# Patient Record
Sex: Male | Born: 1975 | Race: Black or African American | Hispanic: No | Marital: Single | Smoking: Never smoker
Health system: Southern US, Community
[De-identification: ages and names within clinical notes are randomized; demographics above are authoritative.]

## PROBLEM LIST (undated history)

## (undated) DIAGNOSIS — Z21 Asymptomatic human immunodeficiency virus [HIV] infection status: Secondary | ICD-10-CM

## (undated) DIAGNOSIS — K029 Dental caries, unspecified: Secondary | ICD-10-CM

## (undated) DIAGNOSIS — A630 Anogenital (venereal) warts: Secondary | ICD-10-CM

## (undated) DIAGNOSIS — B20 Human immunodeficiency virus [HIV] disease: Secondary | ICD-10-CM

## (undated) DIAGNOSIS — M109 Gout, unspecified: Secondary | ICD-10-CM

## (undated) DIAGNOSIS — B009 Herpesviral infection, unspecified: Secondary | ICD-10-CM

## (undated) DIAGNOSIS — R635 Abnormal weight gain: Secondary | ICD-10-CM

## (undated) DIAGNOSIS — K625 Hemorrhage of anus and rectum: Secondary | ICD-10-CM

## (undated) HISTORY — PX: TENDON REPAIR: SHX5111

## (undated) HISTORY — DX: Gout, unspecified: M10.9

## (undated) HISTORY — DX: Human immunodeficiency virus (HIV) disease: B20

## (undated) HISTORY — DX: Dental caries, unspecified: K02.9

## (undated) HISTORY — DX: Asymptomatic human immunodeficiency virus (hiv) infection status: Z21

## (undated) HISTORY — DX: Herpesviral infection, unspecified: B00.9

## (undated) HISTORY — DX: Anogenital (venereal) warts: A63.0

---
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
## 2003-12-28 ENCOUNTER — Emergency Department: Payer: Self-pay | Admitting: Emergency Medicine

## 2004-08-01 ENCOUNTER — Emergency Department: Payer: Self-pay | Admitting: General Practice

## 2004-08-01 ENCOUNTER — Other Ambulatory Visit: Payer: Self-pay

## 2004-10-22 ENCOUNTER — Emergency Department: Payer: Self-pay | Admitting: Emergency Medicine

## 2004-10-22 ENCOUNTER — Other Ambulatory Visit: Payer: Self-pay

## 2005-07-04 ENCOUNTER — Emergency Department: Payer: Self-pay | Admitting: General Practice

## 2005-07-11 ENCOUNTER — Emergency Department: Payer: Self-pay | Admitting: Emergency Medicine

## 2005-07-13 ENCOUNTER — Emergency Department: Payer: Self-pay | Admitting: Unknown Physician Specialty

## 2005-07-14 ENCOUNTER — Emergency Department: Payer: Self-pay | Admitting: Emergency Medicine

## 2005-08-07 ENCOUNTER — Emergency Department: Payer: Self-pay | Admitting: Emergency Medicine

## 2005-08-08 ENCOUNTER — Emergency Department: Payer: Self-pay | Admitting: Emergency Medicine

## 2005-09-07 ENCOUNTER — Emergency Department: Payer: Self-pay | Admitting: General Practice

## 2006-10-22 ENCOUNTER — Emergency Department: Payer: Self-pay | Admitting: Emergency Medicine

## 2006-12-03 ENCOUNTER — Emergency Department: Payer: Self-pay | Admitting: Emergency Medicine

## 2006-12-03 ENCOUNTER — Other Ambulatory Visit: Payer: Self-pay

## 2006-12-19 ENCOUNTER — Emergency Department: Payer: Self-pay

## 2007-10-18 ENCOUNTER — Emergency Department: Payer: Self-pay | Admitting: Emergency Medicine

## 2007-10-24 ENCOUNTER — Emergency Department: Payer: Self-pay | Admitting: Emergency Medicine

## 2008-10-12 ENCOUNTER — Emergency Department: Payer: Self-pay | Admitting: Emergency Medicine

## 2008-10-13 ENCOUNTER — Emergency Department: Payer: Self-pay | Admitting: Emergency Medicine

## 2009-12-11 ENCOUNTER — Emergency Department: Payer: Self-pay | Admitting: Emergency Medicine

## 2010-01-17 ENCOUNTER — Emergency Department: Payer: Self-pay | Admitting: Unknown Physician Specialty

## 2010-07-12 ENCOUNTER — Emergency Department (HOSPITAL_COMMUNITY)
Admission: EM | Admit: 2010-07-12 | Discharge: 2010-07-12 | Disposition: A | Discharge status: Home / Self Care | Payer: Self-pay | Attending: Emergency Medicine | Admitting: Emergency Medicine

## 2010-07-12 DIAGNOSIS — B081 Molluscum contagiosum: Secondary | ICD-10-CM | POA: Insufficient documentation

## 2010-07-12 DIAGNOSIS — R369 Urethral discharge, unspecified: Secondary | ICD-10-CM | POA: Insufficient documentation

## 2010-12-04 ENCOUNTER — Emergency Department (HOSPITAL_COMMUNITY)
Admission: EM | Admit: 2010-12-04 | Discharge: 2010-12-05 | Disposition: A | Discharge status: Home / Self Care | Payer: Self-pay | Attending: Emergency Medicine | Admitting: Emergency Medicine

## 2010-12-04 ENCOUNTER — Emergency Department (HOSPITAL_COMMUNITY): Payer: Self-pay

## 2010-12-04 DIAGNOSIS — J3489 Other specified disorders of nose and nasal sinuses: Secondary | ICD-10-CM | POA: Insufficient documentation

## 2010-12-04 DIAGNOSIS — R079 Chest pain, unspecified: Secondary | ICD-10-CM | POA: Insufficient documentation

## 2010-12-04 DIAGNOSIS — R05 Cough: Secondary | ICD-10-CM | POA: Insufficient documentation

## 2010-12-04 DIAGNOSIS — J069 Acute upper respiratory infection, unspecified: Secondary | ICD-10-CM | POA: Insufficient documentation

## 2010-12-04 DIAGNOSIS — R059 Cough, unspecified: Secondary | ICD-10-CM | POA: Insufficient documentation

## 2011-02-06 ENCOUNTER — Emergency Department: Payer: Self-pay | Admitting: Unknown Physician Specialty

## 2014-01-07 ENCOUNTER — Ambulatory Visit: Payer: Self-pay | Admitting: Family Medicine

## 2014-01-16 ENCOUNTER — Emergency Department (HOSPITAL_COMMUNITY)
Admission: EM | Admit: 2014-01-16 | Discharge: 2014-01-16 | Disposition: A | Discharge status: Home / Self Care | Payer: Self-pay | Attending: Emergency Medicine | Admitting: Emergency Medicine

## 2014-01-16 ENCOUNTER — Encounter (HOSPITAL_COMMUNITY): Payer: Self-pay | Admitting: Emergency Medicine

## 2014-01-16 DIAGNOSIS — K625 Hemorrhage of anus and rectum: Secondary | ICD-10-CM | POA: Insufficient documentation

## 2014-01-16 DIAGNOSIS — R1030 Lower abdominal pain, unspecified: Secondary | ICD-10-CM | POA: Insufficient documentation

## 2014-01-16 LAB — URINALYSIS, ROUTINE W REFLEX MICROSCOPIC
BILIRUBIN URINE: NEGATIVE
GLUCOSE, UA: NEGATIVE mg/dL
Hgb urine dipstick: NEGATIVE
KETONES UR: NEGATIVE mg/dL
Leukocytes, UA: NEGATIVE
Nitrite: NEGATIVE
PH: 6.5 (ref 5.0–8.0)
Protein, ur: NEGATIVE mg/dL
Specific Gravity, Urine: 1.02 (ref 1.005–1.030)
Urobilinogen, UA: 0.2 mg/dL (ref 0.0–1.0)

## 2014-01-16 LAB — COMPREHENSIVE METABOLIC PANEL
ALBUMIN: 3.7 g/dL (ref 3.5–5.2)
ALK PHOS: 89 U/L (ref 39–117)
ALT: 20 U/L (ref 0–53)
AST: 21 U/L (ref 0–37)
Anion gap: 15 (ref 5–15)
BUN: 11 mg/dL (ref 6–23)
CHLORIDE: 100 meq/L (ref 96–112)
CO2: 23 mEq/L (ref 19–32)
Calcium: 9.2 mg/dL (ref 8.4–10.5)
Creatinine, Ser: 1.07 mg/dL (ref 0.50–1.35)
GFR calc Af Amer: >90 mL/min (ref >90)
GFR calc non Af Amer: 85 mL/min — ABNORMAL LOW (ref >90)
Glucose, Bld: 124 mg/dL — ABNORMAL HIGH (ref 70–99)
POTASSIUM: 4.1 meq/L (ref 3.7–5.3)
Sodium: 138 mEq/L (ref 137–147)
Total Protein: 7.8 g/dL (ref 6.0–8.3)

## 2014-01-16 LAB — CBC WITH DIFFERENTIAL/PLATELET
BASOS ABS: 0 10*3/uL (ref 0.0–0.1)
BASOS PCT: 1 % (ref 0–1)
Eosinophils Absolute: 0.3 10*3/uL (ref 0.0–0.7)
Eosinophils Relative: 5 % (ref 0–5)
HCT: 42.1 % (ref 39.0–52.0)
HEMOGLOBIN: 14 g/dL (ref 13.0–17.0)
Lymphocytes Relative: 38 % (ref 12–46)
Lymphs Abs: 2.5 10*3/uL (ref 0.7–4.0)
MCH: 30.6 pg (ref 26.0–34.0)
MCHC: 33.3 g/dL (ref 30.0–36.0)
MCV: 91.9 fL (ref 78.0–100.0)
MONOS PCT: 9 % (ref 3–12)
Monocytes Absolute: 0.6 10*3/uL (ref 0.1–1.0)
NEUTROS ABS: 3.1 10*3/uL (ref 1.7–7.7)
NEUTROS PCT: 47 % (ref 43–77)
Platelets: 300 10*3/uL (ref 150–400)
RBC: 4.58 MIL/uL (ref 4.22–5.81)
RDW: 13 % (ref 11.5–15.5)
WBC: 6.5 10*3/uL (ref 4.0–10.5)

## 2014-01-16 LAB — POC OCCULT BLOOD, ED: FECAL OCCULT BLD: NEGATIVE

## 2014-01-16 LAB — SAMPLE TO BLOOD BANK

## 2014-01-16 NOTE — ED Notes (Signed)
MD at bedside. 

## 2014-01-16 NOTE — ED Notes (Signed)
Pt. reports intermittent mid abdominal pain with rectal bleeding onset 2 weeks ago , denies nausea or vomitting / no diarrhea .

## 2014-01-16 NOTE — ED Notes (Signed)
Pt ambulating independently w/ steady gait on d/c in no acute distress, A&Ox4.  

## 2014-01-16 NOTE — Discharge Instructions (Signed)

## 2014-01-16 NOTE — ED Provider Notes (Signed)
CSN: [id number]     Arrival date & time 01/16/14  0228 History  This chart was scribed for Warnell Foresterrey Willett Lefeber, MD by Freida Busmaniana Omoyeni, ED Scribe. This patient was seen in room D36C/D36C and the patient's care was started 4:01 AM.    Chief Complaint  Patient presents with  . Abdominal Pain  . Rectal Bleeding     The history is provided by the patient. No language interpreter was used.     HPI Comments:  Robert Stout is a 38 y.o. male who presents to the Emergency Department complaining of 7/10 intermittent abdominal pain that started about 1 week ago. He describes the pain as a squeezing sensation, states it feels like there is a knotting in his abdomen.  He denies association of pain with BMs. He reports associated nausea and intermittent episodes of blood in his stool for about 2 weeks. He describes dark red blood mixed in with his stool. He denies nausea, and vomiting. No alleviating factors noted.   History reviewed. No pertinent past medical history. History reviewed. No pertinent past surgical history. No family history on file. History  Substance Use Topics  . Smoking status: Never Smoker   . Smokeless tobacco: Not on file  . Alcohol Use: No    Review of Systems  Gastrointestinal: Positive for abdominal pain and blood in stool. Negative for nausea and vomiting.  All other systems reviewed and are negative.     Allergies  Review of patient's allergies indicates no known allergies.  Home Medications   Prior to Admission medications   Not on File   BP 116/80 mmHg  Pulse 77  Temp(Src) 97.6 F (36.4 C) (Oral)  Resp 18  Ht 5\' 10"  (1.778 m)  Wt 257 lb (116.574 kg)  BMI 36.88 kg/m2  SpO2 97% Physical Exam  Constitutional: He is oriented to person, place, and time. He appears well-developed and well-nourished. No distress.  HENT:  Head: Normocephalic and atraumatic.  Mouth/Throat: Oropharynx is clear and moist.  Eyes: Conjunctivae are normal. Pupils are equal, round, and  reactive to light. No scleral icterus.  Neck: Neck supple.  Cardiovascular: Normal rate, regular rhythm, normal heart sounds and intact distal pulses.   No murmur heard. Pulmonary/Chest: Effort normal and breath sounds normal. No stridor. No respiratory distress. He has no wheezes. He has no rales.  Abdominal: Soft. He exhibits no distension. There is no tenderness. There is no rigidity, no rebound and no guarding.  Genitourinary: Rectal exam shows no external hemorrhoid, no fissure, no mass and no tenderness. Guaiac negative stool (light brown stool).  Musculoskeletal: Normal range of motion. He exhibits no edema.  Neurological: He is alert and oriented to person, place, and time.  Skin: Skin is warm and dry. No rash noted.  Psychiatric: He has a normal mood and affect. His behavior is normal.  Nursing note and vitals reviewed.   ED Course  Procedures   DIAGNOSTIC STUDIES:  Oxygen Saturation is 97% on RA, normal by my interpretation.    COORDINATION OF CARE:  4:05 AM Discussed treatment plan with pt at bedside and pt agreed to plan.  Labs Review Labs Reviewed  COMPREHENSIVE METABOLIC PANEL - Abnormal; Notable for the following:    Glucose, Bld 124 (*)    Total Bilirubin <0.2 (*)    GFR calc non Af Amer 85 (*)    All other components within normal limits  CBC WITH DIFFERENTIAL  URINALYSIS, ROUTINE W REFLEX MICROSCOPIC  SAMPLE TO BLOOD BANK  Imaging Review No results found.   EKG Interpretation None      MDM   Final diagnoses:  Lower abdominal pain    38 yo male complaining of squeezing intermittent abdominal pain for past week as well as intermittent blood in his stool.  Blood described as dark red.  Very well appearing on exam.  No abdominal tenderness.  Hg 14.0.  Stool was light brown and heme negative.  Labs otherwise unremarkable.  Appeared stable for dc home and outpatient follow up.     I personally performed the services described in this documentation,  which was scribed in my presence. The recorded information has been reviewed and is accurate.    Warnell Foresterrey Daeshon Grammatico, MD 01/16/14 2308

## 2014-01-16 NOTE — ED Notes (Signed)
Pt reminded of need for urine specimen - urinal provided.

## 2014-01-28 ENCOUNTER — Ambulatory Visit: Payer: Self-pay | Admitting: Family Medicine

## 2014-02-07 ENCOUNTER — Ambulatory Visit: Payer: Self-pay | Attending: Internal Medicine | Admitting: Internal Medicine

## 2014-02-07 ENCOUNTER — Encounter: Payer: Self-pay | Admitting: Internal Medicine

## 2014-02-07 VITALS — BP 113/74 | HR 83 | Temp 97.5°F | Resp 18 | Ht 70.0 in | Wt 250.0 lb

## 2014-02-07 DIAGNOSIS — B20 Human immunodeficiency virus [HIV] disease: Secondary | ICD-10-CM

## 2014-02-07 MED ORDER — EFAVIRENZ-EMTRICITAB-TENOFOVIR 600-200-300 MG PO TABS: 1.0000 | ORAL_TABLET | Freq: Every day | ORAL | Status: DC

## 2014-02-07 NOTE — Progress Notes (Signed)
Patient ID: Robert Stout, male   DOB: May 23, 1975, 38 y.o.   MRN: [id number]   [id number]  [id number]  DOB - May 23, 1975  CC:  Chief Complaint  Patient presents with  . Establish Care       HPI: Robert Shaggyeter Hauck is a 38 y.o. male with a past medical history of HIV since 2011, here today to establish medical care. He reports that before he went to prison he was never on any medications for his HIV.  He reports that he went to jail in 2012 and was just released and has been out of his medications for one month.  He states that before he left prison his levels were undectable.  He is requesting a refill of his Atripla until he gets a appointment with infectious disease. He denies all c/o today.  He is up to date on his flu shot.     Patient has No headache, No chest pain, No abdominal pain - No Nausea, No new weakness tingling or numbness, No Cough - SOB.  No Known Allergies Past Medical History  Diagnosis Date  . HIV infection Dx 2011   No current outpatient prescriptions on file prior to visit.   No current facility-administered medications on file prior to visit.   Family History  Problem Relation Age of Onset  . Diabetes Mother    History   Social History  . Marital Status: Single    Spouse Name: N/A    Number of Children: N/A  . Years of Education: N/A   Occupational History  . Not on file.   Social History Main Topics  . Smoking status: Never Smoker   . Smokeless tobacco: Not on file  . Alcohol Use: No  . Drug Use: No  . Sexual Activity: Not on file   Other Topics Concern  . Not on file   Social History Narrative    Review of Systems: Constitutional: Negative for fever, chills, diaphoresis, activity change, appetite change and fatigue. HENT: Negative for ear pain, nosebleeds, congestion, facial swelling, rhinorrhea, neck pain, neck stiffness and ear discharge.  Eyes: Negative for pain, discharge, redness, itching and visual disturbance. Respiratory:  Negative for cough, choking, chest tightness, shortness of breath, wheezing and stridor.  Cardiovascular: Negative for chest pain, palpitations and leg swelling. Gastrointestinal: Negative for abdominal distention. Genitourinary: Negative for dysuria, urgency, frequency, hematuria, flank pain, decreased urine volume, difficulty urinating and dyspareunia.  Musculoskeletal: Negative for back pain, joint swelling, arthralgia and gait problem. Neurological: Negative for dizziness, tremors, seizures, syncope, facial asymmetry, speech difficulty, weakness, light-headedness, numbness and headaches.  Hematological: Negative for adenopathy. Does not bruise/bleed easily. Psychiatric/Behavioral: Negative for hallucinations, behavioral problems, confusion, dysphoric mood, decreased concentration and agitation.    Objective:   Filed Vitals:   02/07/14 0942  BP: 113/74  Pulse: 83  Temp: 97.5 F (36.4 C)  Resp: 18    Physical Exam: Constitutional: Patient appears well-developed and well-nourished. No distress. HENT: Normocephalic, atraumatic, External right and left ear normal. Oropharynx is clear and moist.  Eyes: Conjunctivae and EOM are normal. PERRLA, no scleral icterus. Neck: Normal ROM. Neck supple. No JVD. No tracheal deviation. No thyromegaly. CVS: RRR, S1/S2 +, no murmurs, no gallops, no carotid bruit.  Pulmonary: Effort and breath sounds normal, no stridor, rhonchi, wheezes, rales.  Abdominal: Soft. BS +, no distension, tenderness, rebound or guarding.  Musculoskeletal: Normal range of motion. No edema and no tenderness.  Lymphadenopathy: No lymphadenopathy noted, cervical Skin: Skin is warm and dry. No rash  noted. Not diaphoretic. No erythema. No pallor. Psychiatric: Normal mood and affect. Behavior, judgment, thought content normal.  Lab Results  Component Value Date   WBC 6.5 01/16/2014   HGB 14.0 01/16/2014   HCT 42.1 01/16/2014   MCV 91.9 01/16/2014   PLT 300 01/16/2014    Lab Results  Component Value Date   CREATININE 1.07 01/16/2014   BUN 11 01/16/2014   NA 138 01/16/2014   K 4.1 01/16/2014   CL 100 01/16/2014   CO2 23 01/16/2014    No results found for: HGBA1C Lipid Panel  No results found for: CHOL, TRIG, HDL, CHOLHDL, VLDL, LDLCALC     Assessment and plan:   [name] was seen today for establish care.  Diagnoses and associated orders for this visit:  HIV disease - efavirenz-emtricitabine-tenofovir (ATRIPLA) 600-200-300 MG per tablet; Take 1 tablet by mouth at bedtime. Take on a empty stomach - Ambulatory referral to Infectious Disease Went over risk of transmission of HIV and how to properly care and protect himself.  Patient advised to seek care with Infectious disease to see if they will be able to find him a more affordable medication.  Patient will need to apply for hospital discount   Return if symptoms worsen or fail to improve.    Holland CommonsKECK, Yaman Grauberger, NP-C [facility] [phone] 02/07/2014, 10:19 AM

## 2014-02-07 NOTE — Progress Notes (Signed)
Establish Care Medicine refill

## 2014-02-12 ENCOUNTER — Ambulatory Visit: Payer: Self-pay

## 2014-02-27 ENCOUNTER — Ambulatory Visit: Payer: Self-pay

## 2014-09-04 ENCOUNTER — Encounter (HOSPITAL_COMMUNITY): Payer: Self-pay | Admitting: *Deleted

## 2014-09-04 ENCOUNTER — Emergency Department (HOSPITAL_COMMUNITY)
Admission: EM | Admit: 2014-09-04 | Discharge: 2014-09-04 | Disposition: A | Discharge status: Home / Self Care | Payer: Self-pay | Attending: Emergency Medicine | Admitting: Emergency Medicine

## 2014-09-04 DIAGNOSIS — R42 Dizziness and giddiness: Secondary | ICD-10-CM | POA: Insufficient documentation

## 2014-09-04 DIAGNOSIS — R110 Nausea: Secondary | ICD-10-CM | POA: Insufficient documentation

## 2014-09-04 DIAGNOSIS — R51 Headache: Secondary | ICD-10-CM | POA: Insufficient documentation

## 2014-09-04 DIAGNOSIS — R519 Headache, unspecified: Secondary | ICD-10-CM

## 2014-09-04 DIAGNOSIS — Z21 Asymptomatic human immunodeficiency virus [HIV] infection status: Secondary | ICD-10-CM | POA: Insufficient documentation

## 2014-09-04 LAB — CBC WITH DIFFERENTIAL/PLATELET
Basophils Absolute: 0 10*3/uL (ref 0.0–0.1)
Basophils Relative: 1 % (ref 0–1)
EOS ABS: 0.1 10*3/uL (ref 0.0–0.7)
Eosinophils Relative: 3 % (ref 0–5)
HCT: 40.6 % (ref 39.0–52.0)
HEMOGLOBIN: 13.7 g/dL (ref 13.0–17.0)
LYMPHS ABS: 2.2 10*3/uL (ref 0.7–4.0)
Lymphocytes Relative: 49 % — ABNORMAL HIGH (ref 12–46)
MCH: 30 pg (ref 26.0–34.0)
MCHC: 33.7 g/dL (ref 30.0–36.0)
MCV: 89 fL (ref 78.0–100.0)
MONOS PCT: 10 % (ref 3–12)
Monocytes Absolute: 0.4 10*3/uL (ref 0.1–1.0)
NEUTROS ABS: 1.6 10*3/uL — AB (ref 1.7–7.7)
NEUTROS PCT: 37 % — AB (ref 43–77)
PLATELETS: 233 10*3/uL (ref 150–400)
RBC: 4.56 MIL/uL (ref 4.22–5.81)
RDW: 13.4 % (ref 11.5–15.5)
WBC: 4.3 10*3/uL (ref 4.0–10.5)

## 2014-09-04 LAB — COMPREHENSIVE METABOLIC PANEL
ALT: 28 U/L (ref 17–63)
ANION GAP: 8 (ref 5–15)
AST: 41 U/L (ref 15–41)
Albumin: 3.9 g/dL (ref 3.5–5.0)
Alkaline Phosphatase: 51 U/L (ref 38–126)
BUN: 9 mg/dL (ref 6–20)
CO2: 25 mmol/L (ref 22–32)
Calcium: 9.1 mg/dL (ref 8.9–10.3)
Chloride: 103 mmol/L (ref 101–111)
Creatinine, Ser: 1.22 mg/dL (ref 0.61–1.24)
GFR calc Af Amer: >60 mL/min (ref >60)
GLUCOSE: 119 mg/dL — AB (ref 65–99)
POTASSIUM: 3.8 mmol/L (ref 3.5–5.1)
SODIUM: 136 mmol/L (ref 135–145)
TOTAL PROTEIN: 8 g/dL (ref 6.5–8.1)
Total Bilirubin: 0.6 mg/dL (ref 0.3–1.2)

## 2014-09-04 LAB — CRYPTOCOCCAL ANTIGEN: CRYPTO AG: NEGATIVE

## 2014-09-04 MED ORDER — SODIUM CHLORIDE 0.9 % IV BOLUS (SEPSIS)
1000.0000 mL | INTRAVENOUS | Status: AC
  Administered 2014-09-04: 1000 mL via INTRAVENOUS

## 2014-09-04 MED ORDER — METOCLOPRAMIDE HCL 5 MG/ML IJ SOLN
5.0000 mg | Freq: Once | INTRAMUSCULAR | Status: AC
  Administered 2014-09-04: 5 mg via INTRAVENOUS
  Filled 2014-09-04: qty 2

## 2014-09-04 MED ORDER — ONDANSETRON HCL 4 MG/2ML IJ SOLN
4.0000 mg | Freq: Once | INTRAMUSCULAR | Status: AC
  Administered 2014-09-04: 4 mg via INTRAVENOUS
  Filled 2014-09-04: qty 2

## 2014-09-04 MED ORDER — DIPHENHYDRAMINE HCL 50 MG/ML IJ SOLN
25.0000 mg | Freq: Once | INTRAMUSCULAR | Status: AC
  Administered 2014-09-04: 25 mg via INTRAVENOUS
  Filled 2014-09-04: qty 1

## 2014-09-04 NOTE — ED Provider Notes (Signed)
CSN: [id number]     Arrival date & time 09/04/14  1147 History   First MD Initiated Contact with Patient 09/04/14 1300     Chief Complaint  Patient presents with  . Headache  . Nausea     (Consider location/radiation/quality/duration/timing/severity/associated sxs/prior Treatment) Patient is a 39 y.o. male presenting with headaches. The history is provided by the patient.  Headache Pain location:  R temporal and L temporal Quality:  Dull Radiates to:  Does not radiate Severity currently:  8/10 Severity at highest:  10/10 Onset quality:  Gradual Duration:  3 weeks Timing:  Intermittent Progression:  Unchanged Chronicity:  New Context comment:  At rest Relieved by:  Nothing Worsened by:  Nothing Ineffective treatments:  Acetaminophen Associated symptoms: nausea   Associated symptoms: no abdominal pain, no cough, no diarrhea, no eye pain, no fever, no neck pain, no numbness and no vomiting     Past Medical History  Diagnosis Date  . HIV infection Dx 2011   History reviewed. No pertinent past surgical history. Family History  Problem Relation Age of Onset  . Diabetes Mother    History  Substance Use Topics  . Smoking status: Never Smoker   . Smokeless tobacco: Not on file  . Alcohol Use: No    Review of Systems  Constitutional: Negative for fever.  HENT: Negative for drooling and rhinorrhea.   Eyes: Negative for pain.  Respiratory: Negative for cough and shortness of breath.   Cardiovascular: Negative for chest pain and leg swelling.  Gastrointestinal: Positive for nausea. Negative for vomiting, abdominal pain and diarrhea.  Genitourinary: Negative for dysuria and hematuria.  Musculoskeletal: Negative for gait problem and neck pain.  Skin: Negative for color change.  Neurological: Positive for headaches. Negative for numbness.  Hematological: Negative for adenopathy.  Psychiatric/Behavioral: Negative for behavioral problems.  All other systems reviewed and are  negative.     Allergies  Review of patient's allergies indicates no known allergies.  Home Medications   Prior to Admission medications   Medication Sig Start Date End Date Taking? Authorizing Provider  Efavirenz-Emtricitab-Tenofovir (ATRIPLA PO) Take by mouth.    Historical Provider, MD  efavirenz-emtricitabine-tenofovir (ATRIPLA) 600-200-300 MG per tablet Take 1 tablet by mouth at bedtime. Take on a empty stomach 02/07/14   Ambrose Finland, NP   BP 116/80 mmHg  Pulse 81  Temp(Src) 98.1 F (36.7 C) (Oral)  Resp 16  Ht  (1.778 m)  Wt 243 lb 1.6 oz (110.269 kg)  BMI 34.88 kg/m2  SpO2 96% Physical Exam  Constitutional: He is oriented to person, place, and time. He appears well-developed and well-nourished.  HENT:  Head: Normocephalic and atraumatic.  Right Ear: External ear normal.  Left Ear: External ear normal.  Nose: Nose normal.  Mouth/Throat: Oropharynx is clear and moist. No oropharyngeal exudate.  Eyes: Conjunctivae and EOM are normal. Pupils are equal, round, and reactive to light.  Neck: Normal range of motion. Neck supple.  Cardiovascular: Normal rate, regular rhythm, normal heart sounds and intact distal pulses.  Exam reveals no gallop and no friction rub.   No murmur heard. Pulmonary/Chest: Effort normal and breath sounds normal. No respiratory distress. He has no wheezes.  Abdominal: Soft. Bowel sounds are normal. He exhibits no distension. There is no tenderness. There is no rebound and no guarding.  Musculoskeletal: Normal range of motion. He exhibits no edema or tenderness.  Neurological: He is alert and oriented to person, place, and time.  alert, oriented x3 speech: normal  in context and clarity memory: intact grossly cranial nerves II-XII: intact motor strength: full proximally and distally no involuntary movements or tremors sensation: intact to light touch diffusely  cerebellar: finger-to-nose and heel-to-shin intact gait: normal forwards and  backwards  Skin: Skin is warm and dry.  Psychiatric: He has a normal mood and affect. His behavior is normal.  Nursing note and vitals reviewed.   ED Course  Procedures (including critical care time) Labs Review Labs Reviewed  CBC WITH DIFFERENTIAL/PLATELET - Abnormal; Notable for the following:    Neutrophils Relative % 37 (*)    Neutro Abs 1.6 (*)    Lymphocytes Relative 49 (*)    All other components within normal limits  COMPREHENSIVE METABOLIC PANEL - Abnormal; Notable for the following:    Glucose, Bld 119 (*)    All other components within normal limits    Imaging Review No results found.   EKG Interpretation None      MDM   Final diagnoses:  Dizziness  Headache, unspecified headache type    2:01 PM 39 y.o. male with history of HIV diagnosed in 2011 who presents with a intermittent bitemporal headache which started gradually about 3 weeks ago. The patient states that the headache is intermittent and some days he has no headache at all. He denies any head injuries or fevers. He is afebrile and vital signs are unremarkable here. In review of the notes, it appears that [facility]. He states that he was referred to infectious disease but did not follow-up. He is not currently on any medications. He appears well on exam. Normal range of motion of the neck.   I discussed the case with Dr. Orvan Falconerampbell who is on-call for infectious disease. He recommended some lab work and close f/u with infectious disease. We discussed possible CT of head w/ contrast but he felt like this could be done at a later time if needed and per my discretion. The headache itself is not concerning as it is intermittent and some days he has no headache at all. Will get recommended labs and tx w/ migraine cocktail.   4:22 PM: HA resolved completely w/ migraine cocktail.  I have discussed the diagnosis/risks/treatment options with the patient and believe the pt to be  eligible for discharge home to follow-up with Infectious disease. We also discussed returning to the ED immediately if new or worsening sx occur. We discussed the sx which are most concerning (e.g., worsening HA, fever) that necessitate immediate return. Medications administered to the patient during their visit and any new prescriptions provided to the patient are listed below.  Medications given during this visit Medications  sodium chloride 0.9 % bolus 1,000 mL (1,000 mLs Intravenous New Bag/Given 09/04/14 1407)  metoCLOPramide (REGLAN) injection 5 mg (5 mg Intravenous Given 09/04/14 1407)  diphenhydrAMINE (BENADRYL) injection 25 mg (25 mg Intravenous Given 09/04/14 1407)  ondansetron (ZOFRAN) injection 4 mg (4 mg Intravenous Given 09/04/14 1407)    New Prescriptions   No medications on file     Purvis SheffieldForrest Ziyad Dyar, MD 09/04/14 2104

## 2014-09-04 NOTE — ED Notes (Signed)
Lab called to draw blood on pt 

## 2014-09-04 NOTE — ED Notes (Signed)
Pt reports having recent nausea, headaches and now itching all over. Denies rash. Denies hx of migraines. No acute distress noted at triage.

## 2014-09-04 NOTE — ED Notes (Signed)
Pt is in stable condition upon d/c and ambulates from ED. 

## 2014-09-05 LAB — T-HELPER CELLS (CD4) COUNT (NOT AT ARMC)
CD4 T CELL HELPER: 22 % — AB (ref 33–55)
CD4 T Cell Abs: 460 /uL (ref 400–2700)

## 2014-09-16 LAB — HIV-1 RNA ULTRAQUANT REFLEX TO GENTYP+
HIV-1 RNA BY PCR: 47150 {copies}/mL
HIV-1 RNA Quant, Log: 4.673 log10copy/mL

## 2014-09-16 LAB — REFLEX TO GENOSURE(R) MG: HIV GenoSure(R) MG PDF: 0

## 2014-12-25 ENCOUNTER — Encounter (HOSPITAL_COMMUNITY): Payer: Self-pay | Admitting: Family Medicine

## 2014-12-25 ENCOUNTER — Emergency Department (HOSPITAL_COMMUNITY)
Admission: EM | Admit: 2014-12-25 | Discharge: 2014-12-25 | Discharge status: Against Medical Advice | Payer: Self-pay | Attending: Emergency Medicine | Admitting: Emergency Medicine

## 2014-12-25 DIAGNOSIS — R360 Urethral discharge without blood: Secondary | ICD-10-CM | POA: Insufficient documentation

## 2014-12-25 DIAGNOSIS — R220 Localized swelling, mass and lump, head: Secondary | ICD-10-CM | POA: Insufficient documentation

## 2014-12-25 DIAGNOSIS — N4889 Other specified disorders of penis: Secondary | ICD-10-CM | POA: Insufficient documentation

## 2014-12-25 DIAGNOSIS — Z21 Asymptomatic human immunodeficiency virus [HIV] infection status: Secondary | ICD-10-CM | POA: Insufficient documentation

## 2014-12-25 LAB — URINALYSIS, ROUTINE W REFLEX MICROSCOPIC
BILIRUBIN URINE: NEGATIVE
GLUCOSE, UA: NEGATIVE mg/dL
KETONES UR: NEGATIVE mg/dL
Nitrite: NEGATIVE
PH: 6.5 (ref 5.0–8.0)
PROTEIN: NEGATIVE mg/dL
Specific Gravity, Urine: 1.012 (ref 1.005–1.030)
Urobilinogen, UA: 1 mg/dL (ref 0.0–1.0)

## 2014-12-25 LAB — URINE MICROSCOPIC-ADD ON

## 2014-12-25 NOTE — ED Notes (Signed)
Pt called multiple times for room placement with no answer.  

## 2014-12-25 NOTE — ED Notes (Signed)
Pt here for penile discomfort and discharge. sts also bump to head that is sore.

## 2014-12-26 ENCOUNTER — Encounter (HOSPITAL_COMMUNITY): Payer: Self-pay | Admitting: *Deleted

## 2014-12-26 ENCOUNTER — Emergency Department (HOSPITAL_COMMUNITY)
Admission: EM | Admit: 2014-12-26 | Discharge: 2014-12-26 | Disposition: A | Discharge status: Home / Self Care | Payer: Self-pay | Attending: Emergency Medicine | Admitting: Emergency Medicine

## 2014-12-26 DIAGNOSIS — R369 Urethral discharge, unspecified: Secondary | ICD-10-CM | POA: Insufficient documentation

## 2014-12-26 DIAGNOSIS — Z8619 Personal history of other infectious and parasitic diseases: Secondary | ICD-10-CM | POA: Insufficient documentation

## 2014-12-26 DIAGNOSIS — R350 Frequency of micturition: Secondary | ICD-10-CM | POA: Insufficient documentation

## 2014-12-26 DIAGNOSIS — Z21 Asymptomatic human immunodeficiency virus [HIV] infection status: Secondary | ICD-10-CM | POA: Insufficient documentation

## 2014-12-26 MED ORDER — CEFTRIAXONE SODIUM 250 MG IJ SOLR
250.0000 mg | Freq: Once | INTRAMUSCULAR | Status: AC
  Administered 2014-12-26: 250 mg via INTRAMUSCULAR
  Filled 2014-12-26: qty 250

## 2014-12-26 MED ORDER — LIDOCAINE HCL (PF) 1 % IJ SOLN
0.9000 mL | Freq: Once | INTRAMUSCULAR | Status: AC
  Administered 2014-12-26: 0.9 mL
  Filled 2014-12-26: qty 5

## 2014-12-26 MED ORDER — AZITHROMYCIN 250 MG PO TABS
1000.0000 mg | ORAL_TABLET | Freq: Once | ORAL | Status: AC
  Administered 2014-12-26: 1000 mg via ORAL
  Filled 2014-12-26: qty 4

## 2014-12-26 MED ORDER — ONDANSETRON 4 MG PO TBDP
4.0000 mg | ORAL_TABLET | Freq: Once | ORAL | Status: AC
  Administered 2014-12-26: 4 mg via ORAL
  Filled 2014-12-26: qty 1

## 2014-12-26 NOTE — ED Notes (Signed)
Patient here for penile discharge, patient states he was here yesterday for the same but had to leave due to child care

## 2014-12-26 NOTE — Discharge Instructions (Signed)
Sexually Transmitted Disease  A sexually transmitted disease (STD) is a disease or infection that may be passed (transmitted) from person to person, usually during sexual activity. This may happen by way of saliva, semen, blood, vaginal mucus, or urine. Common STDs include:  · Gonorrhea.  · Chlamydia.  · Syphilis.  · HIV and AIDS.  · Genital herpes.  · Hepatitis B and C.  · Trichomonas.  · Human papillomavirus (HPV).  · Pubic lice.  · Scabies.  · Mites.  · Bacterial vaginosis.  WHAT ARE CAUSES OF STDs?  An STD may be caused by bacteria, a virus, or parasites. STDs are often transmitted during sexual activity if one person is infected. However, they may also be transmitted through nonsexual means. STDs may be transmitted after:   · Sexual intercourse with an infected person.  · Sharing sex toys with an infected person.  · Sharing needles with an infected person or using unclean piercing or tattoo needles.  · Having intimate contact with the genitals, mouth, or rectal areas of an infected person.  · Exposure to infected fluids during birth.  WHAT ARE THE SIGNS AND SYMPTOMS OF STDs?  Different STDs have different symptoms. Some people may not have any symptoms. If symptoms are present, they may include:  · Painful or bloody urination.  · Pain in the pelvis, abdomen, vagina, anus, throat, or eyes.  · A skin rash, itching, or irritation.  · Growths, ulcerations, blisters, or sores in the genital and anal areas.  · Abnormal vaginal discharge with or without bad odor.  · Penile discharge in men.  · Fever.  · Pain or bleeding during sexual intercourse.  · Swollen glands in the groin area.  · Yellow skin and eyes (jaundice). This is seen with hepatitis.  · Swollen testicles.  · Infertility.  · Sores and blisters in the mouth.  HOW ARE STDs DIAGNOSED?  To make a diagnosis, your health care provider may:  · Take a medical history.  · Perform a physical exam.  · Take a sample of any discharge to examine.  · Swab the throat,  cervix, opening to the penis, rectum, or vagina for testing.  · Test a sample of your first morning urine.  · Perform blood tests.  · Perform a Pap test, if this applies.  · Perform a colposcopy.  · Perform a laparoscopy.  HOW ARE STDs TREATED?  Treatment depends on the STD. Some STDs may be treated but not cured.  · Chlamydia, gonorrhea, trichomonas, and syphilis can be cured with antibiotic medicine.  · Genital herpes, hepatitis, and HIV can be treated, but not cured, with prescribed medicines. The medicines lessen symptoms.  · Genital warts from HPV can be treated with medicine or by freezing, burning (electrocautery), or surgery. Warts may come back.  · HPV cannot be cured with medicine or surgery. However, abnormal areas may be removed from the cervix, vagina, or vulva.  · If your diagnosis is confirmed, your recent sexual partners need treatment. This is true even if they are symptom-free or have a negative culture or evaluation. They should not have sex until their health care providers say it is okay.  · Your health care provider may test you for infection again 3 months after treatment.  HOW CAN I REDUCE MY RISK OF GETTING AN STD?  Take these steps to reduce your risk of getting an STD:  · Use latex condoms, dental dams, and water-soluble lubricants during sexual activity. Do not use   petroleum jelly or oils.  · Avoid having multiple sex partners.  · Do not have sex with someone who has other sex partners  · Do not have sex with anyone you do not know or who is at high risk for an STD.  · Avoid risky sex practices that can break your skin.  · Do not have sex if you have open sores on your mouth or skin.  · Avoid drinking too much alcohol or taking illegal drugs. Alcohol and drugs can affect your judgment and put you in a vulnerable position.  · Avoid engaging in oral and anal sex acts.  · Get vaccinated for HPV and hepatitis. If you have not received these vaccines in the past, talk to your health care  provider about whether one or both might be right for you.  · If you are at risk of being infected with HIV, it is recommended that you take a prescription medicine daily to prevent HIV infection. This is called pre-exposure prophylaxis (PrEP). You are considered at risk if:    You are a man who has sex with other men (MSM).    You are a heterosexual man or woman and are sexually active with more than one partner.    You take drugs by injection.    You are sexually active with a partner who has HIV.  · Talk with your health care provider about whether you are at high risk of being infected with HIV. If you choose to begin PrEP, you should first be tested for HIV. You should then be tested every 3 months for as long as you are taking PrEP.  WHAT SHOULD I DO IF I THINK I HAVE AN STD?  · See your health care provider.  · Tell your sexual partner(s). They should be tested and treated for any STDs.  · Do not have sex until your health care provider says it is okay.  WHEN SHOULD I GET IMMEDIATE MEDICAL CARE?  Contact your health care provider right away if:   · You have severe abdominal pain.  · You are a man and notice swelling or pain in your testicles.  · You are a woman and notice swelling or pain in your vagina.     This information is not intended to replace advice given to you by your health care provider. Make sure you discuss any questions you have with your health care provider.     Document Released: 05/01/2002 Document Revised: 03/01/2014 Document Reviewed: 08/29/2012  Elsevier Interactive Patient Education ©2016 Elsevier Inc.    Safe Sex  Safe sex is about reducing the risk of giving or getting a sexually transmitted disease (STD). STDs are spread through sexual contact involving the genitals, mouth, or rectum. Some STDs can be cured and others cannot. Safe sex can also prevent unintended pregnancies.   WHAT ARE SOME SAFE SEX PRACTICES?  · Limit your sexual activity to only one partner who is having sex with  only you.  · Talk to your partner about his or her past partners, past STDs, and drug use.  · Use a condom every time you have sexual intercourse. This includes vaginal, oral, and anal sexual activity. Both females and males should wear condoms during oral sex. Only use latex or polyurethane condoms and water-based lubricants. Using petroleum-based lubricants or oils to lubricate a condom will weaken the condom and increase the chance that it will break. The condom should be in place from the beginning to   the end of sexual activity. Wearing a condom reduces, but does not completely eliminate, your risk of getting or giving an STD. STDs can be spread by contact with infected body fluids and skin.  · Get vaccinated for hepatitis B and HPV.  · Avoid alcohol and recreational drugs, which can affect your judgment. You may forget to use a condom or participate in high-risk sex.  · For females, avoid douching after sexual intercourse. Douching can spread an infection farther into the reproductive tract.  · Check your body for signs of sores, blisters, rashes, or unusual discharge. See your health care provider if you notice any of these signs.  · Avoid sexual contact if you have symptoms of an infection or are being treated for an STD. If you or your partner has herpes, avoid sexual contact when blisters are present. Use condoms at all other times.  · If you are at risk of being infected with HIV, it is recommended that you take a prescription medicine daily to prevent HIV infection. This is called pre-exposure prophylaxis (PrEP). You are considered at risk if:    You are a man who has sex with other men (MSM).    You are a heterosexual man or woman who is sexually active with more than one partner.    You take drugs by injection.    You are sexually active with a partner who has HIV.  · Talk with your health care provider about whether you are at high risk of being infected with HIV. If you choose to begin PrEP, you  should first be tested for HIV. You should then be tested every 3 months for as long as you are taking PrEP.  · See your health care provider for regular screenings, exams, and tests for other STDs. Before having sex with a new partner, each of you should be screened for STDs and should talk about the results with each other.  WHAT ARE THE BENEFITS OF SAFE SEX?   · There is less chance of getting or giving an STD.  · You can prevent unwanted or unintended pregnancies.  · By discussing safe sex concerns with your partner, you may increase feelings of intimacy, comfort, trust, and honesty between the two of you.     This information is not intended to replace advice given to you by your health care provider. Make sure you discuss any questions you have with your health care provider.     Document Released: 03/18/2004 Document Revised: 03/01/2014 Document Reviewed: 08/02/2011  Elsevier Interactive Patient Education ©2016 Elsevier Inc.

## 2014-12-26 NOTE — Care Management (Signed)
Patient [facility] but [facility] not [facility] with [facility] and the [name] program as of yet. Provided information on how to  [facility], with patient's consent will send a [facility] to [facility] scheduler to reach out to patient as well. Updated Abigail PA-C. No further ED CM needs identified.

## 2014-12-26 NOTE — ED Provider Notes (Signed)
CSN: [id number]     Arrival date & time 12/26/14  1317 History  By signing my name below, I, Tanda RockersMargaux Venter, attest that this documentation has been prepared under the direction and in the presence of Arthor CaptainAbigail Hiroto Saltzman, PA-C.  Electronically Signed: Tanda RockersMargaux Venter, ED Scribe. 12/26/2014. 4:16 PM.  Chief Complaint  Patient presents with  . Penile Discharge   The history is provided by the patient. No language interpreter was used.     HPI Comments: Robert Stout is a 39 y.o. male with hx HIV who presents to the Emergency Department complaining of gradual onset, constant, green penile discharge x 3 days. Pt has 1 sexual partner but denies hx of unprotected sex recently. He also complains of urinary frequency. He denies fever, chills, hematuria, testicular pain, or any other associated symptoms. Pt was in the ED yesterday for same symptoms and had his urine collected but left prior to being seen because he needed to go pick up his daughter. Pt has not been taking his HIV medication for the past 4 months due to the cost. He has been trying to find resources to help with the cost.    Past Medical History  Diagnosis Date  . HIV infection (HCC) Dx 2011   History reviewed. No pertinent past surgical history. Family History  Problem Relation Age of Onset  . Diabetes Mother    Social History  Substance Use Topics  . Smoking status: Never Smoker   . Smokeless tobacco: None  . Alcohol Use: No    Review of Systems  Constitutional: Negative for fever and chills.  Genitourinary: Positive for frequency and discharge. Negative for dysuria, hematuria, penile swelling, scrotal swelling, penile pain and testicular pain.   Allergies  Review of patient's allergies indicates no known allergies.  Home Medications   Prior to Admission medications   Medication Sig Start Date End Date Taking? Authorizing Provider  efavirenz-emtricitabine-tenofovir (ATRIPLA) 600-200-300 MG per tablet Take 1 tablet by mouth  at bedtime. Take on a empty stomach Patient not taking: Reported on 09/04/2014 02/07/14   Ambrose FinlandValerie A Keck, NP   Triage Vitals: BP 127/86 mmHg  Pulse 98  Temp(Src) 99.6 F (37.6 C) (Oral)  Resp 18  SpO2 97%   Physical Exam  Constitutional: He is oriented to person, place, and time. He appears well-developed and well-nourished. No distress.  HENT:  Head: Normocephalic and atraumatic.  Eyes: Conjunctivae and EOM are normal.  Neck: Neck supple. No tracheal deviation present.  Cardiovascular: Normal rate.   Pulmonary/Chest: Effort normal. No respiratory distress.  Genitourinary: Penis normal.  Normal male anatomy Creamy discharge from the urethra meatus No lesions No testicular tenderness Bilateral cremasteric reflex present No lymphadenopathy  Musculoskeletal: Normal range of motion.  Neurological: He is alert and oriented to person, place, and time.  Skin: Skin is warm and dry.  Psychiatric: He has a normal mood and affect. His behavior is normal.  Nursing note and vitals reviewed.   ED Course  Procedures (including critical care time)  DIAGNOSTIC STUDIES: Oxygen Saturation is 97% on RA, normal by my interpretation.    COORDINATION OF CARE: 4:12 PM-Discussed treatment plan which includes RPR with pt at bedside and pt agreed to plan.   Labs Review Labs Reviewed  GC/CHLAMYDIA PROBE AMP ([place]) NOT AT [facility]    Imaging Review No results found. I have personally reviewed and evaluated these lab results as part of my medical decision-making.   EKG Interpretation None      MDM  Final diagnoses:  Penile discharge    Patient with purulent penile discharge. Treated here for g/c chlamydia. +HIV not taking his medications. UA from yesterday was negative. Patient given follow up appointment at the ID clinic through case management. Discussed safe sex practices.     I personally performed the services described in this documentation, which was scribed in my  presence. The recorded information has been reviewed and is accurate.          Arthor Captain, PA-C 12/27/14 1333  Mirian Mo, MD 12/28/14 [phone]

## 2014-12-27 LAB — GC/CHLAMYDIA PROBE AMP (~~LOC~~) NOT AT ARMC
CHLAMYDIA, DNA PROBE: NEGATIVE
NEISSERIA GONORRHEA: POSITIVE — AB

## 2014-12-30 ENCOUNTER — Telehealth (HOSPITAL_COMMUNITY): Payer: Self-pay

## 2014-12-30 NOTE — Telephone Encounter (Signed)
Positive for gonorrhea. Treated per protocol. [employer] form faxed. Attempting to contact.  

## 2014-12-30 NOTE — Telephone Encounter (Signed)
Spoke with pt. Verified ID. Informed of labs. Treated per protocol. [employer] form faxed. Pt informed to abstain from sexual activity x 10 days and to notify partner for testing and treatment.  

## 2015-06-30 ENCOUNTER — Emergency Department (HOSPITAL_COMMUNITY)
Admission: EM | Admit: 2015-06-30 | Discharge: 2015-07-01 | Disposition: A | Discharge status: Home / Self Care | Payer: Self-pay | Attending: Emergency Medicine | Admitting: Emergency Medicine

## 2015-06-30 ENCOUNTER — Encounter (HOSPITAL_COMMUNITY): Payer: Self-pay | Admitting: *Deleted

## 2015-06-30 ENCOUNTER — Encounter (HOSPITAL_COMMUNITY): Payer: Self-pay | Admitting: Emergency Medicine

## 2015-06-30 ENCOUNTER — Emergency Department (HOSPITAL_COMMUNITY)
Admission: EM | Admit: 2015-06-30 | Discharge: 2015-06-30 | Disposition: A | Discharge status: Home / Self Care | Payer: Self-pay | Attending: Emergency Medicine | Admitting: Emergency Medicine

## 2015-06-30 DIAGNOSIS — L0211 Cutaneous abscess of neck: Secondary | ICD-10-CM | POA: Insufficient documentation

## 2015-06-30 DIAGNOSIS — B20 Human immunodeficiency virus [HIV] disease: Secondary | ICD-10-CM | POA: Insufficient documentation

## 2015-06-30 NOTE — ED Notes (Signed)
Pt here with abscess to back of neck; pt sts had similar recently with some drainage

## 2015-06-30 NOTE — ED Notes (Signed)
Pt left at 1421.

## 2015-06-30 NOTE — ED Notes (Signed)
Pt here with large size abscess to posterior neck, raised, red, and painful, not draining

## 2015-07-01 ENCOUNTER — Encounter (HOSPITAL_COMMUNITY): Payer: Self-pay

## 2015-07-01 ENCOUNTER — Emergency Department (HOSPITAL_COMMUNITY)
Admission: EM | Admit: 2015-07-01 | Discharge: 2015-07-01 | Disposition: A | Discharge status: Home / Self Care | Payer: Self-pay | Attending: Emergency Medicine | Admitting: Emergency Medicine

## 2015-07-01 DIAGNOSIS — L03221 Cellulitis of neck: Secondary | ICD-10-CM | POA: Insufficient documentation

## 2015-07-01 DIAGNOSIS — B20 Human immunodeficiency virus [HIV] disease: Secondary | ICD-10-CM | POA: Insufficient documentation

## 2015-07-01 MED ORDER — LIDOCAINE HCL (PF) 1 % IJ SOLN
5.0000 mL | Freq: Once | INTRAMUSCULAR | Status: AC
  Administered 2015-07-01: 5 mL
  Filled 2015-07-01: qty 5

## 2015-07-01 MED ORDER — DOXYCYCLINE HYCLATE 100 MG PO CAPS: 100.0000 mg | ORAL_CAPSULE | Freq: Two times a day (BID) | ORAL | Status: DC

## 2015-07-01 NOTE — Discharge Instructions (Signed)
We used an ultrasound and did not see a collection of fluid under the area that is currently tender. We will treat the overlying skin infection with antibiotics. If symptoms worsen, please follow-up with your primary care physician if possible, or back here in the emergency department. If you start developing fevers, chills, nausea/vomiting, please follow-up in the emergency department.   Cellulitis Cellulitis is an infection of the skin and the tissue beneath it. The infected area is usually red and tender. Cellulitis occurs most often in the arms and lower legs.  CAUSES  Cellulitis is caused by bacteria that enter the skin through cracks or cuts in the skin. The most common types of bacteria that cause cellulitis are staphylococci and streptococci. SIGNS AND SYMPTOMS   Redness and warmth.  Swelling.  Tenderness or pain.  Fever. DIAGNOSIS  Your health care provider can usually determine what is wrong based on a physical exam. Blood tests may also be done. TREATMENT  Treatment usually involves taking an antibiotic medicine. HOME CARE INSTRUCTIONS   Take your antibiotic medicine as directed by your health care provider. Finish the antibiotic even if you start to feel better.  Keep the infected arm or leg elevated to reduce swelling.  Apply a warm cloth to the affected area up to 4 times per day to relieve pain.  Take medicines only as directed by your health care provider.  Keep all follow-up visits as directed by your health care provider. SEEK MEDICAL CARE IF:   You notice red streaks coming from the infected area.  Your red area gets larger or turns dark in color.  Your bone or joint underneath the infected area becomes painful after the skin has healed.  Your infection returns in the same area or another area.  You notice a swollen bump in the infected area.  You develop new symptoms.  You have a fever. SEEK IMMEDIATE MEDICAL CARE IF:   You feel very sleepy.  You  develop vomiting or diarrhea.  You have a general ill feeling (malaise) with muscle aches and pains.   This information is not intended to replace advice given to you by your health care provider. Make sure you discuss any questions you have with your health care provider.   Document Released: 11/18/2004 Document Revised: 10/30/2014 Document Reviewed: 04/26/2011 Elsevier Interactive Patient Education [employer]2016 Elsevier Inc.

## 2015-07-01 NOTE — ED Notes (Signed)
Pt presents with abscess to back of neck x 1 week, reports drainage began yesterday.

## 2015-07-01 NOTE — ED Notes (Signed)
Pt has swelling and redness at back of neck which he describes as area of burning and pressure that he tired to squeeze last night with only minimal clear results. Area is about half dollar size and no drainage is noted

## 2015-07-01 NOTE — ED Provider Notes (Signed)
CSN: [id number]     Arrival date & time 07/01/15  1047 History   First MD Initiated Contact with Patient 07/01/15 1109     Chief Complaint  Patient presents with  . Abscess     (Consider location/radiation/quality/duration/timing/severity/associated sxs/prior Treatment) Patient is a 40 y.o. male presenting with abscess. The history is provided by the patient.  Abscess Location:  Head/neck Head/neck abscess location: neck. Size:  6x2.5cm Abscess quality: draining, induration and painful   Red streaking: no   Duration:  1 week Progression:  Unchanged Relieved by:  Nothing Worsened by:  Draining/squeezing Ineffective treatments:  Warm compresses (Tylenol) Associated symptoms: no fatigue, no fever, no headaches, no nausea and no vomiting   Risk factors: prior abscess     Past Medical History  Diagnosis Date  . HIV infection (HCC) Dx 2011   History reviewed. No pertinent past surgical history. Family History  Problem Relation Age of Onset  . Diabetes Mother    Social History  Substance Use Topics  . Smoking status: Never Smoker   . Smokeless tobacco: None  . Alcohol Use: No    Review of Systems  Constitutional: Negative for fever and fatigue.  Gastrointestinal: Negative for nausea and vomiting.  Neurological: Negative for headaches.  All other systems reviewed and are negative.     Allergies  Review of patient's allergies indicates no known allergies.  Home Medications   Prior to Admission medications   Medication Sig Start Date End Date Taking? Authorizing Provider  doxycycline (VIBRAMYCIN) 100 MG capsule Take 1 capsule (100 mg total) by mouth 2 (two) times daily. 07/01/15   Narda Bondsalph A Nettey, MD  efavirenz-emtricitabine-tenofovir (ATRIPLA) 600-200-300 MG per tablet Take 1 tablet by mouth at bedtime. Take on a empty stomach Patient not taking: Reported on 09/04/2014 02/07/14   Ambrose FinlandValerie A Keck, NP   BP 133/97 mmHg  Pulse 80  Temp(Src) 97.2 F (36.2 C) (Oral)  Resp  16  Ht 5\' 10"  (1.778 m)  Wt 104.327 kg  BMI 33.00 kg/m2  SpO2 100% Physical Exam  Constitutional: He is oriented to person, place, and time. He appears well-developed and well-nourished.  HENT:  Head: Normocephalic and atraumatic.  Eyes: Conjunctivae and EOM are normal. Pupils are equal, round, and reactive to light.  Neck: Normal range of motion. Neck supple.  Cardiovascular: Normal rate and regular rhythm.   No murmur heard. Pulmonary/Chest: Effort normal and breath sounds normal. No respiratory distress. He has no wheezes. He has no rales.  Abdominal: Soft. Bowel sounds are normal. He exhibits no distension. There is no tenderness. There is no rebound.  Musculoskeletal: Normal range of motion. He exhibits no edema or tenderness.  Lymphadenopathy:    He has no cervical adenopathy.  Neurological: He is alert and oriented to person, place, and time.  Skin:     Psychiatric: He has a normal mood and affect. His speech is normal.    ED Course  Procedures (including critical care time) Labs Review Labs Reviewed - No data to display  Imaging Review No results found. I have personally reviewed and evaluated these images and lab results as part of my medical decision-making.   EKG Interpretation None      MDM   Final diagnoses:  Cellulitis of neck   Ultrasound revealed no evidence of abscess. Discussed findings with patient. Will treat as cellulitis with doxycycline 100mg  BID for 7 days. Patient agreeable with plan. Discussed return precautions. Patient afebrile without systemic symptoms. Patient stable for discharge home.  Narda Bonds, MD 07/01/15 1614  Marily Memos, MD 07/02/15 [phone]

## 2015-07-01 NOTE — ED Notes (Signed)
Pt states he understands instructions. Home stable with steady gait. 

## 2016-05-31 DIAGNOSIS — E876 Hypokalemia: Secondary | ICD-10-CM | POA: Diagnosis present

## 2016-05-31 DIAGNOSIS — R911 Solitary pulmonary nodule: Secondary | ICD-10-CM | POA: Diagnosis present

## 2016-05-31 DIAGNOSIS — Z833 Family history of diabetes mellitus: Secondary | ICD-10-CM

## 2016-05-31 DIAGNOSIS — B59 Pneumocystosis: Secondary | ICD-10-CM | POA: Diagnosis present

## 2016-05-31 DIAGNOSIS — B9710 Unspecified enterovirus as the cause of diseases classified elsewhere: Secondary | ICD-10-CM | POA: Diagnosis present

## 2016-05-31 DIAGNOSIS — B9789 Other viral agents as the cause of diseases classified elsewhere: Secondary | ICD-10-CM | POA: Diagnosis present

## 2016-05-31 DIAGNOSIS — Z9114 Patient's other noncompliance with medication regimen: Secondary | ICD-10-CM

## 2016-05-31 DIAGNOSIS — B20 Human immunodeficiency virus [HIV] disease: Secondary | ICD-10-CM | POA: Diagnosis present

## 2016-05-31 DIAGNOSIS — Z590 Homelessness: Secondary | ICD-10-CM

## 2016-05-31 DIAGNOSIS — A419 Sepsis, unspecified organism: Principal | ICD-10-CM | POA: Diagnosis present

## 2016-05-31 DIAGNOSIS — Z8249 Family history of ischemic heart disease and other diseases of the circulatory system: Secondary | ICD-10-CM

## 2016-06-01 ENCOUNTER — Emergency Department (HOSPITAL_COMMUNITY): Payer: Self-pay

## 2016-06-01 ENCOUNTER — Inpatient Hospital Stay (HOSPITAL_COMMUNITY)
Admission: EM | Admit: 2016-06-01 | Discharge: 2016-06-04 | DRG: 976 | Disposition: A | Discharge status: Home / Self Care | Payer: Self-pay | Attending: Internal Medicine | Admitting: Internal Medicine

## 2016-06-01 ENCOUNTER — Encounter (HOSPITAL_COMMUNITY): Payer: Self-pay | Admitting: Emergency Medicine

## 2016-06-01 DIAGNOSIS — Z5900 Homelessness unspecified: Secondary | ICD-10-CM

## 2016-06-01 DIAGNOSIS — J111 Influenza due to unidentified influenza virus with other respiratory manifestations: Secondary | ICD-10-CM

## 2016-06-01 DIAGNOSIS — R000 Tachycardia, unspecified: Secondary | ICD-10-CM

## 2016-06-01 DIAGNOSIS — R05 Cough: Secondary | ICD-10-CM

## 2016-06-01 DIAGNOSIS — Z21 Asymptomatic human immunodeficiency virus [HIV] infection status: Secondary | ICD-10-CM

## 2016-06-01 DIAGNOSIS — R911 Solitary pulmonary nodule: Secondary | ICD-10-CM

## 2016-06-01 DIAGNOSIS — B20 Human immunodeficiency virus [HIV] disease: Secondary | ICD-10-CM

## 2016-06-01 DIAGNOSIS — B59 Pneumocystosis: Secondary | ICD-10-CM

## 2016-06-01 DIAGNOSIS — R0682 Tachypnea, not elsewhere classified: Secondary | ICD-10-CM

## 2016-06-01 DIAGNOSIS — Z833 Family history of diabetes mellitus: Secondary | ICD-10-CM

## 2016-06-01 DIAGNOSIS — E876 Hypokalemia: Secondary | ICD-10-CM | POA: Diagnosis present

## 2016-06-01 DIAGNOSIS — R509 Fever, unspecified: Secondary | ICD-10-CM

## 2016-06-01 DIAGNOSIS — R69 Illness, unspecified: Secondary | ICD-10-CM

## 2016-06-01 DIAGNOSIS — A419 Sepsis, unspecified organism: Principal | ICD-10-CM | POA: Diagnosis present

## 2016-06-01 DIAGNOSIS — Z8249 Family history of ischemic heart disease and other diseases of the circulatory system: Secondary | ICD-10-CM

## 2016-06-01 LAB — CBC WITH DIFFERENTIAL/PLATELET
Basophils Absolute: 0 10*3/uL (ref 0.0–0.1)
Basophils Relative: 0 %
Eosinophils Absolute: 0 10*3/uL (ref 0.0–0.7)
Eosinophils Relative: 1 %
HEMATOCRIT: 32.9 % — AB (ref 39.0–52.0)
HEMOGLOBIN: 11.1 g/dL — AB (ref 13.0–17.0)
LYMPHS ABS: 1.6 10*3/uL (ref 0.7–4.0)
Lymphocytes Relative: 21 %
MCH: 30.3 pg (ref 26.0–34.0)
MCHC: 33.7 g/dL (ref 30.0–36.0)
MCV: 89.9 fL (ref 78.0–100.0)
MONOS PCT: 5 %
Monocytes Absolute: 0.4 10*3/uL (ref 0.1–1.0)
NEUTROS PCT: 73 %
Neutro Abs: 5.5 10*3/uL (ref 1.7–7.7)
Platelets: 258 10*3/uL (ref 150–400)
RBC: 3.66 MIL/uL — AB (ref 4.22–5.81)
RDW: 13.3 % (ref 11.5–15.5)
WBC: 7.6 10*3/uL (ref 4.0–10.5)

## 2016-06-01 LAB — URINALYSIS, ROUTINE W REFLEX MICROSCOPIC
BACTERIA UA: NONE SEEN
Bilirubin Urine: NEGATIVE
Glucose, UA: NEGATIVE mg/dL
HGB URINE DIPSTICK: NEGATIVE
Ketones, ur: NEGATIVE mg/dL
Leukocytes, UA: NEGATIVE
Nitrite: NEGATIVE
PROTEIN: 30 mg/dL — AB
SPECIFIC GRAVITY, URINE: 1.023 (ref 1.005–1.030)
pH: 5 (ref 5.0–8.0)

## 2016-06-01 LAB — COMPREHENSIVE METABOLIC PANEL
ALT: 22 U/L (ref 17–63)
ANION GAP: 11 (ref 5–15)
AST: 44 U/L — ABNORMAL HIGH (ref 15–41)
Albumin: 3.6 g/dL (ref 3.5–5.0)
Alkaline Phosphatase: 52 U/L (ref 38–126)
BUN: 9 mg/dL (ref 6–20)
CO2: 24 mmol/L (ref 22–32)
CREATININE: 1.29 mg/dL — AB (ref 0.61–1.24)
Calcium: 8.9 mg/dL (ref 8.9–10.3)
Chloride: 100 mmol/L — ABNORMAL LOW (ref 101–111)
Glucose, Bld: 113 mg/dL — ABNORMAL HIGH (ref 65–99)
Potassium: 3.1 mmol/L — ABNORMAL LOW (ref 3.5–5.1)
SODIUM: 135 mmol/L (ref 135–145)
Total Bilirubin: 0.5 mg/dL (ref 0.3–1.2)
Total Protein: 8.7 g/dL — ABNORMAL HIGH (ref 6.5–8.1)

## 2016-06-01 LAB — INFLUENZA PANEL BY PCR (TYPE A & B)
INFLBPCR: NEGATIVE
Influenza A By PCR: NEGATIVE

## 2016-06-01 LAB — RESPIRATORY PANEL BY PCR
ADENOVIRUS-RVPPCR: NOT DETECTED
Bordetella pertussis: NOT DETECTED
CORONAVIRUS 229E-RVPPCR: NOT DETECTED
CORONAVIRUS NL63-RVPPCR: NOT DETECTED
CORONAVIRUS OC43-RVPPCR: NOT DETECTED
Chlamydophila pneumoniae: NOT DETECTED
Coronavirus HKU1: NOT DETECTED
Influenza A: NOT DETECTED
Influenza B: NOT DETECTED
MYCOPLASMA PNEUMONIAE-RVPPCR: NOT DETECTED
Metapneumovirus: NOT DETECTED
PARAINFLUENZA VIRUS 1-RVPPCR: NOT DETECTED
Parainfluenza Virus 2: NOT DETECTED
Parainfluenza Virus 3: NOT DETECTED
Parainfluenza Virus 4: NOT DETECTED
Respiratory Syncytial Virus: NOT DETECTED
Rhinovirus / Enterovirus: DETECTED — AB

## 2016-06-01 LAB — I-STAT CG4 LACTIC ACID, ED
LACTIC ACID, VENOUS: 1.77 mmol/L (ref 0.5–1.9)
Lactic Acid, Venous: 0.65 mmol/L (ref 0.5–1.9)

## 2016-06-01 LAB — MAGNESIUM: Magnesium: 1.9 mg/dL (ref 1.7–2.4)

## 2016-06-01 LAB — T-HELPER CELLS (CD4) COUNT (NOT AT ARMC)
CD4 % Helper T Cell: 7 % — ABNORMAL LOW (ref 33–55)
CD4 T Cell Abs: 70 /uL — ABNORMAL LOW (ref 400–2700)

## 2016-06-01 LAB — STREP PNEUMONIAE URINARY ANTIGEN: STREP PNEUMO URINARY ANTIGEN: NEGATIVE

## 2016-06-01 MED ORDER — SODIUM CHLORIDE 0.9 % IV BOLUS (SEPSIS)
1000.0000 mL | Freq: Once | INTRAVENOUS | Status: AC
  Administered 2016-06-01: 1000 mL via INTRAVENOUS

## 2016-06-01 MED ORDER — ACETAMINOPHEN 325 MG PO TABS: 650.0000 mg | ORAL_TABLET | Freq: Once | ORAL | Status: DC | PRN

## 2016-06-01 MED ORDER — ACETAMINOPHEN 650 MG RE SUPP: 650.0000 mg | Freq: Four times a day (QID) | RECTAL | Status: DC | PRN

## 2016-06-01 MED ORDER — SODIUM CHLORIDE 0.9 % IV SOLN
INTRAVENOUS | Status: DC
  Administered 2016-06-01: 100 mL via INTRAVENOUS
  Administered 2016-06-01 – 2016-06-02 (×2): via INTRAVENOUS

## 2016-06-01 MED ORDER — ONDANSETRON HCL 4 MG PO TABS: 4.0000 mg | ORAL_TABLET | Freq: Four times a day (QID) | ORAL | Status: DC | PRN

## 2016-06-01 MED ORDER — ACETAMINOPHEN 325 MG PO TABS
650.0000 mg | ORAL_TABLET | Freq: Four times a day (QID) | ORAL | Status: DC | PRN
  Administered 2016-06-01 – 2016-06-02 (×4): 650 mg via ORAL
  Filled 2016-06-01 (×5): qty 2

## 2016-06-01 MED ORDER — SULFAMETHOXAZOLE-TRIMETHOPRIM 800-160 MG PO TABS
1.0000 | ORAL_TABLET | ORAL | Status: DC
  Administered 2016-06-02: 1 via ORAL
  Filled 2016-06-01 (×2): qty 1

## 2016-06-01 MED ORDER — ACETAMINOPHEN 325 MG PO TABS
ORAL_TABLET | ORAL | Status: AC
  Filled 2016-06-01: qty 2

## 2016-06-01 MED ORDER — ALBUTEROL SULFATE (2.5 MG/3ML) 0.083% IN NEBU
5.0000 mg | INHALATION_SOLUTION | Freq: Once | RESPIRATORY_TRACT | Status: AC
  Administered 2016-06-01: 5 mg via RESPIRATORY_TRACT
  Filled 2016-06-01: qty 6

## 2016-06-01 MED ORDER — ONDANSETRON HCL 4 MG/2ML IJ SOLN: 4.0000 mg | Freq: Four times a day (QID) | INTRAMUSCULAR | Status: DC | PRN

## 2016-06-01 MED ORDER — POTASSIUM CHLORIDE CRYS ER 20 MEQ PO TBCR
40.0000 meq | EXTENDED_RELEASE_TABLET | Freq: Once | ORAL | Status: AC
  Administered 2016-06-01: 40 meq via ORAL
  Filled 2016-06-01: qty 2

## 2016-06-01 MED ORDER — DEXTROSE 5 % IV SOLN
1.0000 g | INTRAVENOUS | Status: DC
  Administered 2016-06-01: 1 g via INTRAVENOUS
  Filled 2016-06-01: qty 10

## 2016-06-01 MED ORDER — GUAIFENESIN-DM 100-10 MG/5ML PO SYRP
5.0000 mL | ORAL_SOLUTION | ORAL | Status: DC | PRN
  Administered 2016-06-01 – 2016-06-02 (×4): 5 mL via ORAL
  Filled 2016-06-01 (×4): qty 5

## 2016-06-01 MED ORDER — DOLUTEGRAVIR SODIUM 50 MG PO TABS
50.0000 mg | ORAL_TABLET | Freq: Every day | ORAL | Status: DC
  Administered 2016-06-01 – 2016-06-04 (×4): 50 mg via ORAL
  Filled 2016-06-01 (×4): qty 1

## 2016-06-01 MED ORDER — AZITHROMYCIN 500 MG PO TABS
500.0000 mg | ORAL_TABLET | ORAL | Status: DC
  Administered 2016-06-01 – 2016-06-02 (×2): 500 mg via ORAL
  Filled 2016-06-01 (×2): qty 1

## 2016-06-01 MED ORDER — IBUPROFEN 400 MG PO TABS
400.0000 mg | ORAL_TABLET | Freq: Once | ORAL | Status: AC
  Administered 2016-06-01: 400 mg via ORAL
  Filled 2016-06-01: qty 1

## 2016-06-01 MED ORDER — EMTRICITABINE-TENOFOVIR AF 200-25 MG PO TABS
1.0000 | ORAL_TABLET | Freq: Every day | ORAL | Status: DC
  Administered 2016-06-01 – 2016-06-04 (×4): 1 via ORAL
  Filled 2016-06-01 (×4): qty 1

## 2016-06-01 MED ORDER — SODIUM CHLORIDE 0.9 % IV BOLUS (SEPSIS)
500.0000 mL | Freq: Once | INTRAVENOUS | Status: AC
  Administered 2016-06-01: 500 mL via INTRAVENOUS

## 2016-06-01 MED ORDER — ENOXAPARIN SODIUM 40 MG/0.4ML ~~LOC~~ SOLN
40.0000 mg | SUBCUTANEOUS | Status: DC
  Administered 2016-06-01 – 2016-06-03 (×3): 40 mg via SUBCUTANEOUS
  Filled 2016-06-01 (×3): qty 0.4

## 2016-06-01 MED ORDER — PIPERACILLIN-TAZOBACTAM 3.375 G IVPB 30 MIN
3.3750 g | Freq: Once | INTRAVENOUS | Status: AC
  Administered 2016-06-01: 3.375 g via INTRAVENOUS
  Filled 2016-06-01: qty 50

## 2016-06-01 MED ORDER — VANCOMYCIN HCL IN DEXTROSE 1-5 GM/200ML-% IV SOLN
1000.0000 mg | Freq: Once | INTRAVENOUS | Status: AC
  Administered 2016-06-01: 1000 mg via INTRAVENOUS
  Filled 2016-06-01: qty 200

## 2016-06-01 MED ORDER — MORPHINE SULFATE (PF) 4 MG/ML IV SOLN
4.0000 mg | Freq: Once | INTRAVENOUS | Status: AC
  Administered 2016-06-01: 4 mg via INTRAVENOUS
  Filled 2016-06-01: qty 1

## 2016-06-01 MED ORDER — ALBUTEROL SULFATE (2.5 MG/3ML) 0.083% IN NEBU: 2.5000 mg | INHALATION_SOLUTION | RESPIRATORY_TRACT | Status: DC | PRN

## 2016-06-01 NOTE — ED Triage Notes (Signed)
Pt presents with cold symptoms x 2 weeks - fevers, chills, cough (now non-productive), chest congestion, sore throat, and headaches - worsening the past couple days. Pt reports trying every OTC medication without relief. Resp e/u, breath sounds clear.

## 2016-06-01 NOTE — Progress Notes (Signed)
   Patient admitted earlier this morning, see H&P. Patient presenting with fever, chills, cough over the past couple of weeks. He does have medical history of HIV, currently not on medical therapy. Looks as though he has been in jail for early part of 2011-2012, followed up with [facility] in Dec 2015, was prescribed Atripla but does not seem to have followed up with ID. Now presenting with CD4 70, fever 102.9.   Blood cultures pending Continue Rocephin, azithromycin for now Consult ID  Consult CM, SW for assistance with medication, treatment    Noralee Stain, DO [facility] [email or web address] Password [email or web address] 06/01/2016, 1:53 PM

## 2016-06-01 NOTE — H&P (Signed)
History and Physical  Patient Name: Robert Stout     ZOX:[id number]    DOB: 03-Dec-1975    DOA: 06/01/2016 PCP: No PCP Per Patient   Patient coming from: Outside  Chief Complaint: Fever, cough  HPI: Robert Stout is a 41 y.o. male with a past medical history significant for HIV, untreated who presents with fever and cough for 2 weeks.  The patient was in his usual health until about two weeks ago when he developed fever, myalgias, headache, persistent cough, cough with chest pain.  This slowly worsened until he came to the ER today.  He had purulent sputum for a day or two, but this resolved.  No hemoptysis, sweats.  He has HIV, is off meds for at least 3 years because of cost.  ED course: -Temp 103F, heart rate 107, respirations 22, BP 136/82, pulse ox normal -Na 135, K 3.1, Cr 1.29 (baseline 1.2), WBC 7.6K, Hgb 11.1 -AST 44 -Lactic acid normal -UA without pyuria -CXR without focal infiltrate or bilateral infiltrates -Blood cultures were obtained and he was given 30 cc/kg fluids as well as vancomycin and Zosyn for sepsis from unclear source and TRH were asked to evaluate for admission     ROS: Review of Systems  Constitutional: Positive for fever and malaise/fatigue.  Respiratory: Positive for cough and sputum production. Negative for hemoptysis, shortness of breath and wheezing.   Cardiovascular: Positive for chest pain.  All other systems reviewed and are negative.         Past Medical History:  Diagnosis Date  . HIV infection (HCC) Dx 2011    History reviewed. No pertinent surgical history.  Social History: Patient lives on the street for the last year.  The patient walks unassisted. He is from [place] originally.  He does not smoke.  No Known Allergies  Family history: family history includes Diabetes in his mother; Hypertension in his mother.  Prior to Admission medications   Medication Sig Start Date End Date Taking? Authorizing Provider    efavirenz-emtricitabine-tenofovir (ATRIPLA) 600-200-300 MG per tablet Take 1 tablet by mouth at bedtime. Take on a empty stomach Patient not taking: Reported on 09/04/2014 02/07/14   Ambrose Finland, NP       Physical Exam: BP 117/64   Pulse (!) 111   Temp 100.1 F (37.8 C) (Oral)   Resp (!) 45   Ht  (1.778 m)   Wt 102.1 kg (225 lb)   SpO2 (!) 87%   BMI 32.28 kg/m  General appearance: Well-developed, adult male, alert and in moderate distress from cough, chest pain.   Eyes: Anicteric, conjunctiva pink, lids and lashes normal. PERRL.    ENT: No nasal deformity, discharge, epistaxis.  Hearing normal. OP moist without lesions.   Skin: Warm and dry.  No suspicious rashes or lesions. Cardiac: Tachycardic, regular, nl S1-S2, no murmurs appreciated.  Capillary refill is brisk.  JVP normal.  No LE edema.  Radial pulses 2+ and symmetric. Respiratory: Normal respiratory rate and rhythm.  CTAB without rales or wheezes. Abdomen: Abdomen soft.  No TTP. No ascites, distension, hepatosplenomegaly.   MSK: No deformities or effusions.  No cyanosis or clubbing. Neuro: Cranial nerves normal.  Sensation intact to light touch. Speech is fluent.  Muscle strength normal.    Psych: Sensorium intact and responding to questions, attention normal.  Behavior appropriate.  Affect blunted by pain.  Judgment and insight appear normal.     Labs on Admission:  I have personally reviewed following labs  and imaging studies: CBC:  Recent Labs Lab 06/01/16 0004  WBC 7.6  NEUTROABS 5.5  HGB 11.1*  HCT 32.9*  MCV 89.9  PLT 258   Basic Metabolic Panel:  Recent Labs Lab 06/01/16 0004  NA 135  K 3.1*  CL 100*  CO2 24  GLUCOSE 113*  BUN 9  CREATININE 1.29*  CALCIUM 8.9   GFR: Estimated Creatinine Clearance: 88.4 mL/min (A) (by C-G formula based on SCr of 1.29 mg/dL (H)).  Liver Function Tests:  Recent Labs Lab 06/01/16 0004  AST 44*  ALT 22  ALKPHOS 52  BILITOT 0.5  PROT 8.7*   ALBUMIN 3.6   No results for input(s): LIPASE, AMYLASE in the last 168 hours. No results for input(s): AMMONIA in the last 168 hours. Coagulation Profile: No results for input(s): INR, PROTIME in the last 168 hours. Cardiac Enzymes: No results for input(s): CKTOTAL, CKMB, CKMBINDEX, TROPONINI in the last 168 hours. BNP (last 3 results) No results for input(s): PROBNP in the last 8760 hours. HbA1C: No results for input(s): HGBA1C in the last 72 hours. CBG: No results for input(s): GLUCAP in the last 168 hours. Lipid Profile: No results for input(s): CHOL, HDL, LDLCALC, TRIG, CHOLHDL, LDLDIRECT in the last 72 hours. Thyroid Function Tests: No results for input(s): TSH, T4TOTAL, FREET4, T3FREE, THYROIDAB in the last 72 hours. Anemia Panel: No results for input(s): VITAMINB12, FOLATE, FERRITIN, TIBC, IRON, RETICCTPCT in the last 72 hours. Sepsis Labs: Lactic acid 1.77 Invalid input(s): PROCALCITONIN, LACTICIDVEN No results found for this or any previous visit (from the past 240 hour(s)).       Radiological Exams on Admission: Personally reviewed CXR shows no infiltrate: Dg Chest 2 View  Result Date: 06/01/2016 CLINICAL DATA:  Cough and fever.  Left-sided chest pain EXAM: CHEST  2 VIEW COMPARISON:  Chest radiograph 12/04/2010 FINDINGS: Cardiac silhouette remains upper limits normal. No focal airspace consolidation or pulmonary edema. No pneumothorax or pleural effusion. IMPRESSION: No focal cardiopulmonary disease. Electronically Signed   By: Deatra Robinson M.D.   On: 06/01/2016 00:35    EKG: Independently reviewed. Rate 119, QTc 437, some mild ST depressions.    Assessment/Plan  1. Early sepsis, suspected pneumonia:  Fever and cough.  Is homeless and has hx of incarceration, but without infiltrate on CXR and with acute onset, TB doubted.  Mild PCP? -Influenza pending -Ceftriaxone and azithromycin ordered -Blood cultures pending -Acetaminophen for fever -Consult to ID,  appreciate recommendations   2. Hypokalemia:  Treated in ER. -Check magnesium  3. Untreated HIV:  -Consult to ID -Consult to CM and SW for medication assistance, housing       DVT prophylaxis: Lovenox  Code Status: FULL  Family Communication: None present  Disposition Plan: Anticipate empiric antibiotics and consult to ID.   Consults called: None overnight Admission status: INPATIENT        Medical decision making: Patient seen at 5:45 AM on 06/01/2016.  The patient was discussed with Dierdre Forth, PA-C.  What exists of the patient's chart was reviewed in depth and summarized above.  Clinical condition: stable.        Alberteen Sam [facility] Pager [phone]

## 2016-06-01 NOTE — Consult Note (Addendum)
Date of Admission:  06/01/2016  Date of Consult:  06/01/2016  Reason for Consult: Fever unknown origin in patient with HIV and AIDS Referring Physician: Dr. Maylene Roes   HPI: Robert Stout is an 41 y.o. male diagnosed with HIV in 2011. He was on antiretroviral therapy in the form of Atripla while he was in jail for 3 years between 2012 in 2015. He appears to been seen once at the health [facility] wellness clinic in 2015 which was not the appropriate place to have sent him. We could've gotten him plugged into the [name] assistance program if he had been referred to our clinic. He then failed to engage in fairly claimed to be trying to engage in care. He had been seen in the ER along the way and had diagnosis of gonorrhea made while he was viremic with HIV.  He has now been admitted the hospital with fever and cough. He states he feels better after having been here overnight and says that his fevers have broken. He tested negative for influenza A and B. His chest x-ray was clear but he was started on antibiotics for a pneumonia which is a bit puzzling. He does have a history of a lung nodule seen on CT scan over 8 years ago but is not clearly evident on chest x-ray.  His CD4 count is now quite low at 70 viral load is undoubtedly high in the 100s of thousands of copies. He is currently working a part-time job and says that he has at least to pay stubs in his car. He is largely homeless living in his car and at times at other places wherever a friend allows him to spend the night.  Past Medical History:  Diagnosis Date  . HIV infection ([place]) Dx 2011    Past Surgical History:  Procedure Laterality Date  . TENDON REPAIR      Social History:  reports that he has never smoked. He has never used smokeless tobacco. He reports that he does not drink alcohol or use drugs.   Family History  Problem Relation Age of Onset  . Diabetes Mother   . Hypertension Mother     No Known  Allergies   Medications: I have reviewed patients current medications as documented in Epic Anti-infectives    Start     Dose/Rate Route Frequency Ordered Stop   06/02/16 0900  sulfamethoxazole-trimethoprim (BACTRIM DS,SEPTRA DS) 800-160 MG per tablet 1 tablet     1 tablet Oral Once per day on Mon Wed Fri 06/01/16 1508     06/01/16 1515  dolutegravir (TIVICAY) tablet 50 mg     50 mg Oral Daily 06/01/16 1508     06/01/16 1515  emtricitabine-tenofovir AF (DESCOVY) 200-25 MG per tablet 1 tablet     1 tablet Oral Daily 06/01/16 1508     06/01/16 1000  cefTRIAXone (ROCEPHIN) 1 g in dextrose 5 % 50 mL IVPB  Status:  Discontinued     1 g 100 mL/hr over 30 Minutes Intravenous Every 24 hours 06/01/16 0752 06/01/16 1729   06/01/16 0800  azithromycin (ZITHROMAX) tablet 500 mg     500 mg Oral Every 24 hours 06/01/16 0752 06/08/16 0759   06/01/16 0330  piperacillin-tazobactam (ZOSYN) IVPB 3.375 g     3.375 g 100 mL/hr over 30 Minutes Intravenous  Once 06/01/16 0324 06/01/16 0427   06/01/16 0330  vancomycin (VANCOCIN) IVPB 1000 mg/200 mL premix     1,000 mg 200 mL/hr  over 60 Minutes Intravenous  Once 06/01/16 0324 06/01/16 0458         ROS:  as in HPI otherwise remainder of 12 point Review of Systems is negative   Blood pressure 109/67, pulse 86, temperature 98.4 F (36.9 C), temperature source Oral, resp. rate 17, height '5\' 10"'$  (1.778 m), weight 225 lb (102.1 kg), SpO2 100 %. General: Alert and awake, oriented x3, not in any acute distress. HEENT: anicteric sclera,  EOMI, oropharynx clear and without exudate Cardiovascular: regular rate, normal r,  no murmur rubs or gallops Pulmonary: Fairly clear to auscultation bilaterally, no wheezing, rales or rhonchi Gastrointestinal: soft nontender, nondistended, normal bowel sounds, Musculoskeletal: no  clubbing or edema noted bilaterally Skin, soft tissue: no rashes Neuro: nonfocal, strength and sensation intact   Results for orders placed or  performed during the hospital encounter of 06/01/16 (from the past 48 hour(s))  Urinalysis, Routine w reflex microscopic     Status: Abnormal   Collection Time: 06/01/16 12:03 AM  Result Value Ref Range   Color, Urine YELLOW YELLOW   APPearance CLEAR CLEAR   Specific Gravity, Urine 1.023 1.005 - 1.030   pH 5.0 5.0 - 8.0   Glucose, UA NEGATIVE NEGATIVE mg/dL   Hgb urine dipstick NEGATIVE NEGATIVE   Bilirubin Urine NEGATIVE NEGATIVE   Ketones, ur NEGATIVE NEGATIVE mg/dL   Protein, ur 30 (A) NEGATIVE mg/dL   Nitrite NEGATIVE NEGATIVE   Leukocytes, UA NEGATIVE NEGATIVE   RBC / HPF 0-5 0 - 5 RBC/hpf   WBC, UA 0-5 0 - 5 WBC/hpf   Bacteria, UA NONE SEEN NONE SEEN   Squamous Epithelial / LPF 0-5 (A) NONE SEEN  Comprehensive metabolic panel     Status: Abnormal   Collection Time: 06/01/16 12:04 AM  Result Value Ref Range   Sodium 135 135 - 145 mmol/L   Potassium 3.1 (L) 3.5 - 5.1 mmol/L   Chloride 100 (L) 101 - 111 mmol/L   CO2 24 22 - 32 mmol/L   Glucose, Bld 113 (H) 65 - 99 mg/dL   BUN 9 6 - 20 mg/dL   Creatinine, Ser 1.29 (H) 0.61 - 1.24 mg/dL   Calcium 8.9 8.9 - 10.3 mg/dL   Total Protein 8.7 (H) 6.5 - 8.1 g/dL   Albumin 3.6 3.5 - 5.0 g/dL   AST 44 (H) 15 - 41 U/L   ALT 22 17 - 63 U/L   Alkaline Phosphatase 52 38 - 126 U/L   Total Bilirubin 0.5 0.3 - 1.2 mg/dL   GFR calc non Af Amer >60 >60 mL/min   GFR calc Af Amer >60 >60 mL/min    Comment: (NOTE) The eGFR has been calculated using the CKD EPI equation. This calculation has not been validated in all clinical situations. eGFR's persistently <60 mL/min signify possible Chronic Kidney Disease.    Anion gap 11 5 - 15  CBC with Differential     Status: Abnormal   Collection Time: 06/01/16 12:04 AM  Result Value Ref Range   WBC 7.6 4.0 - 10.5 K/uL   RBC 3.66 (L) 4.22 - 5.81 MIL/uL   Hemoglobin 11.1 (L) 13.0 - 17.0 g/dL   HCT 32.9 (L) 39.0 - 52.0 %   MCV 89.9 78.0 - 100.0 fL   MCH 30.3 26.0 - 34.0 pg   MCHC 33.7 30.0 -  36.0 g/dL   RDW 13.3 11.5 - 15.5 %   Platelets 258 150 - 400 K/uL   Neutrophils Relative % 73 %  Neutro Abs 5.5 1.7 - 7.7 K/uL   Lymphocytes Relative 21 %   Lymphs Abs 1.6 0.7 - 4.0 K/uL   Monocytes Relative 5 %   Monocytes Absolute 0.4 0.1 - 1.0 K/uL   Eosinophils Relative 1 %   Eosinophils Absolute 0.0 0.0 - 0.7 K/uL   Basophils Relative 0 %   Basophils Absolute 0.0 0.0 - 0.1 K/uL  I-Stat CG4 Lactic Acid, ED     Status: None   Collection Time: 06/01/16 12:49 AM  Result Value Ref Range   Lactic Acid, Venous 1.77 0.5 - 1.9 mmol/L  Influenza panel by PCR (type A & B)     Status: None   Collection Time: 06/01/16  2:27 AM  Result Value Ref Range   Influenza A By PCR NEGATIVE NEGATIVE   Influenza B By PCR NEGATIVE NEGATIVE    Comment: (NOTE) The Xpert Xpress Flu assay is intended as an aid in the diagnosis of  influenza and should not be used as a sole basis for treatment.  This  assay is FDA approved for nasopharyngeal swab specimens only. Nasal  washings and aspirates are unacceptable for Xpert Xpress Flu testing.   T-helper cells (CD4) count (not at [facility])     Status: Abnormal   Collection Time: 06/01/16  3:06 AM  Result Value Ref Range   CD4 T Cell Abs 70 (L) 400 - 2,700 /uL   CD4 % Helper T Cell 7 (L) 33 - 55 %    Comment: Performed at [facility], [place] [address], [place], [place] [address]  I-Stat CG4 Lactic Acid, ED     Status: None   Collection Time: 06/01/16  3:38 AM  Result Value Ref Range   Lactic Acid, Venous 0.65 0.5 - 1.9 mmol/L  Magnesium     Status: None   Collection Time: 06/01/16  8:30 AM  Result Value Ref Range   Magnesium 1.9 1.7 - 2.4 mg/dL   '@BRIEFLABTABLE'$ (sdes,specrequest,cult,reptstatus)   )No results found for this or any previous visit (from the past 720 hour(s)).   Impression/Recommendation  Principal Problem:   Sepsis ([place]) Active Problems:   HIV disease ([place])   Hypokalemia   Robert Stout is a 41 y.o. male with HIV  and AIDS that is gone untreated for several years now admitted with fever and cough  #1 Fever of unknown origin: Certainly could be a viral infection I will see what his respiratory panel shows if he continues to have fevers we'll may have to launch a more thorough workup. She only his HIV itself could cause fevers. He is at risk for Legrand Como Bactrim avium infection and other opportunistic infections but have much clinically to indicate that he has such an infection specifically has no evidence of headaches that are persisting that would make me worry about cryptococcal meningitis.  I will discontinue ceftriaxone and a remain on azithromycin in the interim in case he has an atypical bronchitis  #2 HIV, AIDS: I will start him on [place] and DESCOVY. We will try to make sure that he gets [employer] going and that he is enrolled in the [name] program. We need to pay stops for the [name] program and I believe for [employer]. I have given him my card and I'll make sure that we can see him in our clinic and get the appropriate paperwork filled out to make sure he gets medications from [facility] and then from [name]. Will likely have the [facility] medications mailed  to the clinic where he can have them picked up.  I'll order an HIV viral load with reflex genotype for tomorrow along with an HLA test hepatitis studies and RPR as well as gonorrhea and chlamydia from urine  #3 Homelessness: I will engage [facility] to help him with this   06/01/2016, 5:29 PM   Thank you so much for this interesting consult  [place] for [place] [phone] (pager) [phone] (office) 06/01/2016, 5:29 PM  [place] 06/01/2016, 5:29 PM

## 2016-06-01 NOTE — ED Provider Notes (Signed)
[facility]-EMERGENCY DEPT Provider Note   CSN: [id number] Arrival date & time: 05/31/16  2308     History   Chief Complaint Chief Complaint  Patient presents with  . Fever  . Cough    HPI Robert Stout is a 41 y.o. male with a hx of HIV presents to the Emergency Department complaining of gradual, persistent, progressively worsening Fevers, chills, diaphoresis, cough, congestion onset 2 weeks ago. Associated symptoms include pain in the chest and ribs with coughing.  Patient reports nothing makes symptoms better. He has tried over-the-counter cold and flu medications without relief.  Patient denies recent travel or sick contacts. Patient does report that he has been diagnosed with HIV for several years. He reports he's been unable to afford his medications and has not seen a doctor here in [place].  Record review shows patient's last CD4 count was in 2016 and was 22 at that time. Patient denies abdominal pain, nausea, vomiting, diarrhea, weakness, dizziness, syncope.  Patient denies weight loss. He does report night sweats however states that he's had fevers and sweating during the day and night.  The history is provided by the patient and medical records. No language interpreter was used.    Past Medical History:  Diagnosis Date  . HIV infection [facility]) Dx 2011    Patient Active Problem List   Diagnosis Date Noted  . Sepsis (HCC) 06/01/2016  . HIV disease (HCC) 02/07/2014    No past surgical history on file.     Home Medications    Prior to Admission medications   Medication Sig Start Date End Date Taking? Authorizing Provider  doxycycline (VIBRAMYCIN) 100 MG capsule Take 1 capsule (100 mg total) by mouth 2 (two) times daily. Patient not taking: Reported on 06/01/2016 07/01/15   Narda Bonds, MD  efavirenz-emtricitabine-tenofovir (ATRIPLA) 600-200-300 MG per tablet Take 1 tablet by mouth at bedtime. Take on a empty stomach Patient not taking: Reported on 09/04/2014 02/07/14    Ambrose Finland, NP    Family History Family History  Problem Relation Age of Onset  . Diabetes Mother     Social History Social History  Substance Use Topics  . Smoking status: Never Smoker  . Smokeless tobacco: Never Used  . Alcohol use No     Allergies   Patient has no known allergies.   Review of Systems Review of Systems  Constitutional: Positive for chills, diaphoresis and fever.  Respiratory: Positive for cough and shortness of breath.   Cardiovascular: Positive for chest pain ( With cough).  Allergic/Immunologic: Positive for immunocompromised state.     Physical Exam Updated Vital Signs BP 120/69   Pulse (!) 112   Temp 100.1 F (37.8 C) (Oral)   Resp (!) 44   Ht  (1.778 m)   Wt 102.1 kg   SpO2 97%   BMI 32.28 kg/m   Physical Exam  Constitutional: He appears well-developed and well-nourished. No distress.  Awake, alert, ill-appearing  HENT:  Head: Normocephalic and atraumatic.  Mouth/Throat: Oropharynx is clear and moist. No oropharyngeal exudate.  Eyes: Conjunctivae are normal. No scleral icterus.  Neck: Normal range of motion. Neck supple.  Cardiovascular: Regular rhythm and intact distal pulses.  Tachycardia present.   Pulses:      Radial pulses are 2+ on the right side, and 2+ on the left side.       Dorsalis pedis pulses are 2+ on the right side, and 2+ on the left side.  Pulmonary/Chest: Effort normal. No respiratory  distress. He has decreased breath sounds (throughout). He has no wheezes.  Equal chest expansion  Abdominal: Soft. Bowel sounds are normal. He exhibits no mass. There is no tenderness. There is no rebound and no guarding.  Musculoskeletal: Normal range of motion. He exhibits no edema.  Neurological: He is alert.  Speech is clear and goal oriented Moves extremities without ataxia  Skin: Capillary refill takes 2 to 3 seconds. He is diaphoretic.  Hot to touch  Psychiatric: He has a normal mood and affect.  Nursing note and  vitals reviewed.    ED Treatments / Results  Labs (all labs ordered are listed, but only abnormal results are displayed) Labs Reviewed  COMPREHENSIVE METABOLIC PANEL - Abnormal; Notable for the following:       Result Value   Potassium 3.1 (*)    Chloride 100 (*)    Glucose, Bld 113 (*)    Creatinine, Ser 1.29 (*)    Total Protein 8.7 (*)    AST 44 (*)    All other components within normal limits  CBC WITH DIFFERENTIAL/PLATELET - Abnormal; Notable for the following:    RBC 3.66 (*)    Hemoglobin 11.1 (*)    HCT 32.9 (*)    All other components within normal limits  URINALYSIS, ROUTINE W REFLEX MICROSCOPIC - Abnormal; Notable for the following:    Protein, ur 30 (*)    Squamous Epithelial / LPF 0-5 (*)    All other components within normal limits  CULTURE, BLOOD (ROUTINE X 2)  CULTURE, BLOOD (ROUTINE X 2)  URINE CULTURE  INFLUENZA PANEL BY PCR (TYPE A & B)  T-HELPER CELLS (CD4) COUNT (NOT AT [facility])  HIV-1 RNA ULTRAQUANT REFLEX TO GENTYP+  I-STAT CG4 LACTIC ACID, ED  I-STAT CG4 LACTIC ACID, ED    Radiology Dg Chest 2 View  Result Date: 06/01/2016 CLINICAL DATA:  Cough and fever.  Left-sided chest pain EXAM: CHEST  2 VIEW COMPARISON:  Chest radiograph 12/04/2010 FINDINGS: Cardiac silhouette remains upper limits normal. No focal airspace consolidation or pulmonary edema. No pneumothorax or pleural effusion. IMPRESSION: No focal cardiopulmonary disease. Electronically Signed   By: Deatra Robinson M.D.   On: 06/01/2016 00:35    Procedures Procedures (including critical care time)  Medications Ordered in ED Medications  acetaminophen (TYLENOL) tablet 650 mg (not administered)  acetaminophen (TYLENOL) 325 MG tablet (not administered)  sodium chloride 0.9 % bolus 1,000 mL (1,000 mLs Intravenous New Bag/Given 06/01/16 0452)    And  sodium chloride 0.9 % bolus 500 mL (0 mLs Intravenous Stopped 06/01/16 0417)  sodium chloride 0.9 % bolus 1,000 mL (0 mLs Intravenous Stopped 06/01/16  0330)  potassium chloride SA (K-DUR,KLOR-CON) CR tablet 40 mEq (40 mEq Oral Given 06/01/16 0251)  sodium chloride 0.9 % bolus 1,000 mL (1,000 mLs Intravenous New Bag/Given 06/01/16 0347)  morphine 4 MG/ML injection 4 mg (4 mg Intravenous Given 06/01/16 0348)  albuterol (PROVENTIL) (2.5 MG/3ML) 0.083% nebulizer solution 5 mg (5 mg Nebulization Given 06/01/16 0355)  piperacillin-tazobactam (ZOSYN) IVPB 3.375 g (3.375 g Intravenous New Bag/Given 06/01/16 0357)  vancomycin (VANCOCIN) IVPB 1000 mg/200 mL premix (1,000 mg Intravenous New Bag/Given 06/01/16 0358)     Initial Impression / Assessment and Plan / ED Course  I have reviewed the triage vital signs and the nursing notes.  Pertinent labs & imaging results that were available during my care of the patient were reviewed by me and considered in my medical decision making (see chart for details).  Clinical  Course as of Jun 01 516  Tue Jun 01, 2016  0518 Discussed with Dr. Maryfrances Bunnell who will admit  [HM]    Clinical Course User Index [HM] Dierdre Forth, PA-C    Patient presents with several weeks of fevers. Today he is ill-appearing and tachycardic. Immunocompromised due to lack of treatment for his HIV.  Patient with fever greater than 103 here in the emergency department and tachycardia. He is t tachypenic and tachycardic.  No leukocytosis however I'm concerned about patient's persistent fevers, tachycardia and the setting of his immunocompromised. Fever of unknown origin at this time.  Sepsis protocol initiated. Patient will need admission for further evaluation.  Final Clinical Impressions(s) / ED Diagnoses   Final diagnoses:  HIV (human immunodeficiency virus infection) (HCC)  Influenza-like illness  Tachycardia  Tachypnea    New Prescriptions New Prescriptions   No medications on file     Dierdre Forth, PA-C 06/01/16 0518    Azalia Bilis, MD 06/01/16 [phone]

## 2016-06-02 ENCOUNTER — Inpatient Hospital Stay (HOSPITAL_COMMUNITY): Payer: Self-pay

## 2016-06-02 DIAGNOSIS — R509 Fever, unspecified: Secondary | ICD-10-CM

## 2016-06-02 DIAGNOSIS — B9719 Other enterovirus as the cause of diseases classified elsewhere: Secondary | ICD-10-CM

## 2016-06-02 LAB — URINE CULTURE

## 2016-06-02 LAB — CBC
HCT: 32.1 % — ABNORMAL LOW (ref 39.0–52.0)
HEMOGLOBIN: 10.6 g/dL — AB (ref 13.0–17.0)
MCH: 29.5 pg (ref 26.0–34.0)
MCHC: 33 g/dL (ref 30.0–36.0)
MCV: 89.4 fL (ref 78.0–100.0)
PLATELETS: 231 10*3/uL (ref 150–400)
RBC: 3.59 MIL/uL — AB (ref 4.22–5.81)
RDW: 13.5 % (ref 11.5–15.5)
WBC: 8.3 10*3/uL (ref 4.0–10.5)

## 2016-06-02 LAB — BASIC METABOLIC PANEL
ANION GAP: 13 (ref 5–15)
BUN: 6 mg/dL (ref 6–20)
CO2: 18 mmol/L — AB (ref 22–32)
CREATININE: 1.13 mg/dL (ref 0.61–1.24)
Calcium: 8.3 mg/dL — ABNORMAL LOW (ref 8.9–10.3)
Chloride: 104 mmol/L (ref 101–111)
GFR calc non Af Amer: >60 mL/min (ref >60)
Glucose, Bld: 108 mg/dL — ABNORMAL HIGH (ref 65–99)
Potassium: 3.5 mmol/L (ref 3.5–5.1)
SODIUM: 135 mmol/L (ref 135–145)

## 2016-06-02 LAB — HEPATITIS B SURFACE ANTIGEN: HEP B S AG: NEGATIVE

## 2016-06-02 LAB — LEGIONELLA PNEUMOPHILA SEROGP 1 UR AG: L. pneumophila Serogp 1 Ur Ag: NEGATIVE

## 2016-06-02 LAB — RPR: RPR: NONREACTIVE

## 2016-06-02 MED ORDER — IOPAMIDOL (ISOVUE-300) INJECTION 61%
INTRAVENOUS | Status: AC
  Administered 2016-06-02: 75 mL
  Filled 2016-06-02: qty 75

## 2016-06-02 MED ORDER — IBUPROFEN 600 MG PO TABS
600.0000 mg | ORAL_TABLET | Freq: Four times a day (QID) | ORAL | Status: DC | PRN
  Administered 2016-06-02: 600 mg via ORAL
  Filled 2016-06-02: qty 1

## 2016-06-02 MED ORDER — SODIUM CHLORIDE 0.9 % IV SOLN
INTRAVENOUS | Status: DC
  Administered 2016-06-02 – 2016-06-03 (×2): via INTRAVENOUS

## 2016-06-02 NOTE — Progress Notes (Signed)
PROGRESS NOTE    Alaster Asfaw  ZOX:[id number] DOB: 06-08-1975 DOA: 06/01/2016 PCP: No PCP Per Patient     Brief Narrative:  Robert Stout is a 41 y.o. male with a past medical history significant for HIV, untreated who presents with fever and cough for 2 weeks. On presentation, patient had fevers of unknown origin. He was empirically started on Rocephin, azithromycin.  Assessment & Plan:   Principal Problem:   Sepsis (HCC) Active Problems:   HIV disease (HCC)   Hypokalemia   Influenza-like illness   AIDS (acquired immune deficiency syndrome) (HCC)   FUO (fever of unknown origin)   Lung nodule   Homelessness   Fever in immunosuppressed  -Respiratory panel positive for Rhinovirus / Enterovirus  -Blood cultures negative to date -Urine culture contaminated with multiple sp  -CT chest ordered by ID -ID following -Currently on azithromycin, bactrim   HIV/AIDS -CD4 count is 70 -ID following, started on tivicay, descovy   Homelessness -SW, CM consulted   DVT prophylaxis: lovenox Code Status: Full Family Communication: no family at bedside Disposition Plan: pending improvement   Consultants:   ID  Procedures:   None  Antimicrobials:  Anti-infectives    Start     Dose/Rate Route Frequency Ordered Stop   06/02/16 0900  sulfamethoxazole-trimethoprim (BACTRIM DS,SEPTRA DS) 800-160 MG per tablet 1 tablet     1 tablet Oral Once per day on Mon Wed Fri 06/01/16 1508     06/01/16 1515  dolutegravir (TIVICAY) tablet 50 mg     50 mg Oral Daily 06/01/16 1508     06/01/16 1515  emtricitabine-tenofovir AF (DESCOVY) 200-25 MG per tablet 1 tablet     1 tablet Oral Daily 06/01/16 1508     06/01/16 1000  cefTRIAXone (ROCEPHIN) 1 g in dextrose 5 % 50 mL IVPB  Status:  Discontinued     1 g 100 mL/hr over 30 Minutes Intravenous Every 24 hours 06/01/16 0752 06/01/16 1729   06/01/16 0800  azithromycin (ZITHROMAX) tablet 500 mg     500 mg Oral Every 24 hours 06/01/16 0752 06/08/16  0759   06/01/16 0330  piperacillin-tazobactam (ZOSYN) IVPB 3.375 g     3.375 g 100 mL/hr over 30 Minutes Intravenous  Once 06/01/16 0324 06/01/16 0427   06/01/16 0330  vancomycin (VANCOCIN) IVPB 1000 mg/200 mL premix     1,000 mg 200 mL/hr over 60 Minutes Intravenous  Once 06/01/16 0324 06/01/16 0458           Subjective: Patient with some improvement today. Continues to have fevers, chills. He denies any chest pain, shortness of breath, has a dry cough, no nausea, vomiting or diarrhea, no abdominal pain.  Objective: Vitals:   06/01/16 2147 06/02/16 0054 06/02/16 0527 06/02/16 0648  BP: 130/69  128/61   Pulse: (!) 115  99   Resp:   18   Temp: (!) 103.1 F (39.5 C) 98.3 F (36.8 C) (!) 102.9 F (39.4 C) 99.5 F (37.5 C)  TempSrc: Oral  Oral Oral  SpO2: 97%  97%   Weight:      Height:        Intake/Output Summary (Last 24 hours) at 06/02/16 1359 Last data filed at 06/02/16 1000  Gross per 24 hour  Intake          2098.33 ml  Output              550 ml  Net          1548.33 ml  Filed Weights   05/31/16 2335 06/01/16 0351 06/01/16 0417  Weight: 102.1 kg (225 lb) 102.9 kg (226 lb 14.4 oz) 102.1 kg (225 lb)    Examination:  General exam: Appears calm Respiratory system: Clear to auscultation. Respiratory effort normal. Cardiovascular system: S1 & S2 heard, Tachycardic, regular.  Gastrointestinal system: Abdomen is nondistended, soft and nontender.  Central nervous system: Alert and oriented. No focal neurological deficits. Extremities: Symmetric 5 x 5 power. Skin: No rashes, lesions or ulcers Psychiatry: Judgement and insight appear normal. Mood & affect appropriate.   Data Reviewed: I have personally reviewed following labs and imaging studies  CBC:  Recent Labs Lab 06/01/16 0004 06/02/16 0616  WBC 7.6 8.3  NEUTROABS 5.5  --   HGB 11.1* 10.6*  HCT 32.9* 32.1*  MCV 89.9 89.4  PLT 258 231   Basic Metabolic Panel:  Recent Labs Lab 06/01/16 0004  06/01/16 0830 06/02/16 0616  NA 135  --  135  K 3.1*  --  3.5  CL 100*  --  104  CO2 24  --  18*  GLUCOSE 113*  --  108*  BUN 9  --  6  CREATININE 1.29*  --  1.13  CALCIUM 8.9  --  8.3*  MG  --  1.9  --    GFR: Estimated Creatinine Clearance: 100.9 mL/min (by C-G formula based on SCr of 1.13 mg/dL). Liver Function Tests:  Recent Labs Lab 06/01/16 0004  AST 44*  ALT 22  ALKPHOS 52  BILITOT 0.5  PROT 8.7*  ALBUMIN 3.6   No results for input(s): LIPASE, AMYLASE in the last 168 hours. No results for input(s): AMMONIA in the last 168 hours. Coagulation Profile: No results for input(s): INR, PROTIME in the last 168 hours. Cardiac Enzymes: No results for input(s): CKTOTAL, CKMB, CKMBINDEX, TROPONINI in the last 168 hours. BNP (last 3 results) No results for input(s): PROBNP in the last 8760 hours. HbA1C: No results for input(s): HGBA1C in the last 72 hours. CBG: No results for input(s): GLUCAP in the last 168 hours. Lipid Profile: No results for input(s): CHOL, HDL, LDLCALC, TRIG, CHOLHDL, LDLDIRECT in the last 72 hours. Thyroid Function Tests: No results for input(s): TSH, T4TOTAL, FREET4, T3FREE, THYROIDAB in the last 72 hours. Anemia Panel: No results for input(s): VITAMINB12, FOLATE, FERRITIN, TIBC, IRON, RETICCTPCT in the last 72 hours. Sepsis Labs:  Recent Labs Lab 06/01/16 0049 06/01/16 0338  LATICACIDVEN 1.77 0.65    Recent Results (from the past 240 hour(s))  Urine culture     Status: Abnormal   Collection Time: 06/01/16 12:03 AM  Result Value Ref Range Status   Specimen Description URINE, CLEAN CATCH  Final   Special Requests ADDED [phone]  Final   Culture MULTIPLE SPECIES PRESENT, SUGGEST RECOLLECTION (A)  Final   Report Status 06/02/2016 FINAL  Final  Blood Culture (routine x 2)     Status: None (Preliminary result)   Collection Time: 06/01/16  3:24 AM  Result Value Ref Range Status   Specimen Description BLOOD LEFT ARM  Final   Special Requests    Final    BOTTLES DRAWN AEROBIC AND ANAEROBIC Blood Culture adequate volume   Culture NO GROWTH 1 DAY  Final   Report Status PENDING  Incomplete  Blood Culture (routine x 2)     Status: None (Preliminary result)   Collection Time: 06/01/16  4:06 AM  Result Value Ref Range Status   Specimen Description BLOOD LEFT ARM  Final   Special  Requests   Final    BOTTLES DRAWN AEROBIC AND ANAEROBIC Blood Culture adequate volume   Culture NO GROWTH 1 DAY  Final   Report Status PENDING  Incomplete  Respiratory Panel by PCR     Status: Abnormal   Collection Time: 06/01/16 12:57 PM  Result Value Ref Range Status   Adenovirus NOT DETECTED NOT DETECTED Final   Coronavirus 229E NOT DETECTED NOT DETECTED Final   Coronavirus HKU1 NOT DETECTED NOT DETECTED Final   Coronavirus NL63 NOT DETECTED NOT DETECTED Final   Coronavirus OC43 NOT DETECTED NOT DETECTED Final   Metapneumovirus NOT DETECTED NOT DETECTED Final   Rhinovirus / Enterovirus DETECTED (A) NOT DETECTED Final   Influenza A NOT DETECTED NOT DETECTED Final   Influenza B NOT DETECTED NOT DETECTED Final   Parainfluenza Virus 1 NOT DETECTED NOT DETECTED Final   Parainfluenza Virus 2 NOT DETECTED NOT DETECTED Final   Parainfluenza Virus 3 NOT DETECTED NOT DETECTED Final   Parainfluenza Virus 4 NOT DETECTED NOT DETECTED Final   Respiratory Syncytial Virus NOT DETECTED NOT DETECTED Final   Bordetella pertussis NOT DETECTED NOT DETECTED Final   Chlamydophila pneumoniae NOT DETECTED NOT DETECTED Final   Mycoplasma pneumoniae NOT DETECTED NOT DETECTED Final       Radiology Studies: Dg Chest 2 View  Result Date: 06/01/2016 CLINICAL DATA:  Cough and fever.  Left-sided chest pain EXAM: CHEST  2 VIEW COMPARISON:  Chest radiograph 12/04/2010 FINDINGS: Cardiac silhouette remains upper limits normal. No focal airspace consolidation or pulmonary edema. No pneumothorax or pleural effusion. IMPRESSION: No focal cardiopulmonary disease. Electronically Signed    By: Deatra Robinson M.D.   On: 06/01/2016 00:35      Scheduled Meds: . azithromycin  500 mg Oral Q24H  . dolutegravir  50 mg Oral Daily  . emtricitabine-tenofovir AF  1 tablet Oral Daily  . enoxaparin (LOVENOX) injection  40 mg Subcutaneous Q24H  . sulfamethoxazole-trimethoprim  1 tablet Oral Once per day on Mon Wed Fri   Continuous Infusions: . sodium chloride 100 mL/hr at 06/02/16 0537     LOS: 1 day    Time spent: 40 minutes   Noralee Stain, DO [facility] [email or web address] Password [facility] 06/02/2016, 1:59 PM

## 2016-06-02 NOTE — Progress Notes (Signed)
Subjective:  Febrile again last night   Antibiotics:  Anti-infectives    Start     Dose/Rate Route Frequency Ordered Stop   06/02/16 0900  sulfamethoxazole-trimethoprim (BACTRIM DS,SEPTRA DS) 800-160 MG per tablet 1 tablet     1 tablet Oral Once per day on Mon Wed Fri 06/01/16 1508     06/01/16 1515  dolutegravir (TIVICAY) tablet 50 mg     50 mg Oral Daily 06/01/16 1508     06/01/16 1515  emtricitabine-tenofovir AF (DESCOVY) 200-25 MG per tablet 1 tablet     1 tablet Oral Daily 06/01/16 1508     06/01/16 1000  cefTRIAXone (ROCEPHIN) 1 g in dextrose 5 % 50 mL IVPB  Status:  Discontinued     1 g 100 mL/hr over 30 Minutes Intravenous Every 24 hours 06/01/16 0752 06/01/16 1729   06/01/16 0800  azithromycin (ZITHROMAX) tablet 500 mg     500 mg Oral Every 24 hours 06/01/16 0752 06/08/16 0759   06/01/16 0330  piperacillin-tazobactam (ZOSYN) IVPB 3.375 g     3.375 g 100 mL/hr over 30 Minutes Intravenous  Once 06/01/16 0324 06/01/16 0427   06/01/16 0330  vancomycin (VANCOCIN) IVPB 1000 mg/200 mL premix     1,000 mg 200 mL/hr over 60 Minutes Intravenous  Once 06/01/16 0324 06/01/16 0458      Medications: Scheduled Meds: . azithromycin  500 mg Oral Q24H  . dolutegravir  50 mg Oral Daily  . emtricitabine-tenofovir AF  1 tablet Oral Daily  . enoxaparin (LOVENOX) injection  40 mg Subcutaneous Q24H  . sulfamethoxazole-trimethoprim  1 tablet Oral Once per day on Mon Wed Fri   Continuous Infusions: . sodium chloride 100 mL/hr at 06/02/16 1533   PRN Meds:.acetaminophen **OR** acetaminophen, albuterol, guaiFENesin-dextromethorphan, ondansetron **OR** ondansetron (ZOFRAN) IV    Objective: Weight change:   Intake/Output Summary (Last 24 hours) at 06/02/16 1619 Last data filed at 06/02/16 1542  Gross per 24 hour  Intake          2233.33 ml  Output              550 ml  Net          1683.33 ml   Blood pressure 137/68, pulse (!) 107, temperature (!) 103.2 F (39.6 C),  temperature source Oral, resp. rate 18, height  (1.778 m), weight 225 lb (102.1 kg), SpO2 96 %. Temp:  [98.3 F (36.8 C)-103.2 F (39.6 C)] 103.2 F (39.6 C) (04/11 1501) Pulse Rate:  [99-115] 107 (04/11 1501) Resp:  [18] 18 (04/11 1501) BP: (128-137)/(61-69) 137/68 (04/11 1501) SpO2:  [96 %-97 %] 96 % (04/11 1501)  Physical Exam: General: Alert and awake, oriented x3, not in any acute distress. HEENT: anicteric sclera, EOMI, no thrush CVS regular rate, normal r,  no murmur rubs or gallops Chest: clear to auscultation bilaterally, no wheezing, rales or rhonchi Abdomen: soft nontender, nondistended, normal bowel sounds, Extremities: no  clubbing or edema noted bilaterally Skin: no rashes Neuro: nonfocal  CBC:  CBC Latest Ref Rng & Units 06/02/2016 06/01/2016 09/04/2014  WBC 4.0 - 10.5 K/uL 8.3 7.6 4.3  Hemoglobin 13.0 - 17.0 g/dL 10.6(L) 11.1(L) 13.7  Hematocrit 39.0 - 52.0 % 32.1(L) 32.9(L) 40.6  Platelets 150 - 400 K/uL 231 258 233      BMET  Recent Labs  06/01/16 0004 06/02/16 0616  NA 135 135  K 3.1* 3.5  CL 100* 104  CO2 24 18*  GLUCOSE  113* 108*  BUN 9 6  CREATININE 1.29* 1.13  CALCIUM 8.9 8.3*     Liver Panel   Recent Labs  06/01/16 0004  PROT 8.7*  ALBUMIN 3.6  AST 44*  ALT 22  ALKPHOS 52  BILITOT 0.5       Sedimentation Rate No results for input(s): ESRSEDRATE in the last 72 hours. C-Reactive Protein No results for input(s): CRP in the last 72 hours.  Micro Results: Recent Results (from the past 720 hour(s))  Urine culture     Status: Abnormal   Collection Time: 06/01/16 12:03 AM  Result Value Ref Range Status   Specimen Description URINE, CLEAN CATCH  Final   Special Requests ADDED [phone]  Final   Culture MULTIPLE SPECIES PRESENT, SUGGEST RECOLLECTION (A)  Final   Report Status 06/02/2016 FINAL  Final  Blood Culture (routine x 2)     Status: None (Preliminary result)   Collection Time: 06/01/16  3:24 AM  Result Value Ref Range  Status   Specimen Description BLOOD LEFT ARM  Final   Special Requests   Final    BOTTLES DRAWN AEROBIC AND ANAEROBIC Blood Culture adequate volume   Culture NO GROWTH 1 DAY  Final   Report Status PENDING  Incomplete  Blood Culture (routine x 2)     Status: None (Preliminary result)   Collection Time: 06/01/16  4:06 AM  Result Value Ref Range Status   Specimen Description BLOOD LEFT ARM  Final   Special Requests   Final    BOTTLES DRAWN AEROBIC AND ANAEROBIC Blood Culture adequate volume   Culture NO GROWTH 1 DAY  Final   Report Status PENDING  Incomplete  Respiratory Panel by PCR     Status: Abnormal   Collection Time: 06/01/16 12:57 PM  Result Value Ref Range Status   Adenovirus NOT DETECTED NOT DETECTED Final   Coronavirus 229E NOT DETECTED NOT DETECTED Final   Coronavirus HKU1 NOT DETECTED NOT DETECTED Final   Coronavirus NL63 NOT DETECTED NOT DETECTED Final   Coronavirus OC43 NOT DETECTED NOT DETECTED Final   Metapneumovirus NOT DETECTED NOT DETECTED Final   Rhinovirus / Enterovirus DETECTED (A) NOT DETECTED Final   Influenza A NOT DETECTED NOT DETECTED Final   Influenza B NOT DETECTED NOT DETECTED Final   Parainfluenza Virus 1 NOT DETECTED NOT DETECTED Final   Parainfluenza Virus 2 NOT DETECTED NOT DETECTED Final   Parainfluenza Virus 3 NOT DETECTED NOT DETECTED Final   Parainfluenza Virus 4 NOT DETECTED NOT DETECTED Final   Respiratory Syncytial Virus NOT DETECTED NOT DETECTED Final   Bordetella pertussis NOT DETECTED NOT DETECTED Final   Chlamydophila pneumoniae NOT DETECTED NOT DETECTED Final   Mycoplasma pneumoniae NOT DETECTED NOT DETECTED Final    Studies/Results: Dg Chest 2 View  Result Date: 06/01/2016 CLINICAL DATA:  Cough and fever.  Left-sided chest pain EXAM: CHEST  2 VIEW COMPARISON:  Chest radiograph 12/04/2010 FINDINGS: Cardiac silhouette remains upper limits normal. No focal airspace consolidation or pulmonary edema. No pneumothorax or pleural effusion.  IMPRESSION: No focal cardiopulmonary disease. Electronically Signed   By: Deatra Robinson M.D.   On: 06/01/2016 00:35      Assessment/Plan:  INTERVAL HISTORY: As per panel positive for rhinovirus/enterovirus., Patient still febrile   Principal Problem:   Sepsis (HCC) Active Problems:   HIV disease (HCC)   Hypokalemia   Influenza-like illness   AIDS (acquired immune deficiency syndrome) (HCC)   FUO (fever of unknown origin)   Lung nodule  Homelessness    Robert Stout is a 41 y.o. male with  HIV that has been untreated now with AIDS admitted with fevers and cough found to have rhinovirus/enterovirus by PCR from respiratory panel currently on azithromycin and still febrile.  #1 FUO: Would seem very unusual for him to have these fevers and cough merely from a rhinovirus in particular the fevers. He does have a history of a lung nodule. 4 out that a CT of the chest with contrast today. Does not have much in the way of symptoms to suggest disseminated Mycobacterium avium infection but will consider getting an AFB blood culture.  Okay to continue azithromycin for now  #2 HIV and AIDS: Started TIVICAY and DESCOVY as well as Bactrim for PCP prevention  Will need to have [employer] and [name] done ASAP at [place]  #3 Homelessness hoping [facility] can help him        LOS: 1 day   Acey Lav 06/02/2016, 4:19 PM

## 2016-06-03 DIAGNOSIS — R000 Tachycardia, unspecified: Secondary | ICD-10-CM

## 2016-06-03 DIAGNOSIS — B59 Pneumocystosis: Secondary | ICD-10-CM

## 2016-06-03 LAB — CBC
HEMATOCRIT: 31.4 % — AB (ref 39.0–52.0)
HEMOGLOBIN: 10.4 g/dL — AB (ref 13.0–17.0)
MCH: 29.5 pg (ref 26.0–34.0)
MCHC: 33.1 g/dL (ref 30.0–36.0)
MCV: 89 fL (ref 78.0–100.0)
Platelets: 252 10*3/uL (ref 150–400)
RBC: 3.53 MIL/uL — ABNORMAL LOW (ref 4.22–5.81)
RDW: 13.5 % (ref 11.5–15.5)
WBC: 5.4 10*3/uL (ref 4.0–10.5)

## 2016-06-03 LAB — HEPATITIS B SURFACE ANTIBODY, QUANTITATIVE: Hepatitis B-Post: <3.1 m[IU]/mL — ABNORMAL LOW (ref >9.9)

## 2016-06-03 LAB — GC/CHLAMYDIA PROBE AMP (~~LOC~~) NOT AT ARMC
Chlamydia: NEGATIVE
NEISSERIA GONORRHEA: NEGATIVE

## 2016-06-03 LAB — HEPATITIS A ANTIBODY, TOTAL: Hep A Total Ab: NEGATIVE

## 2016-06-03 LAB — HEPATITIS C ANTIBODY (REFLEX): HCV Ab: <0.1 s/co ratio (ref 0.0–0.9)

## 2016-06-03 LAB — HCV COMMENT:

## 2016-06-03 MED ORDER — PREDNISONE 20 MG PO TABS
40.0000 mg | ORAL_TABLET | Freq: Two times a day (BID) | ORAL | Status: DC
  Administered 2016-06-03 – 2016-06-04 (×4): 40 mg via ORAL
  Filled 2016-06-03 (×4): qty 2

## 2016-06-03 MED ORDER — IBUPROFEN 200 MG PO TABS: 200.0000 mg | ORAL_TABLET | Freq: Four times a day (QID) | ORAL | Status: DC | PRN

## 2016-06-03 MED ORDER — SULFAMETHOXAZOLE-TRIMETHOPRIM 800-160 MG PO TABS
2.0000 | ORAL_TABLET | Freq: Three times a day (TID) | ORAL | Status: DC
  Administered 2016-06-03 – 2016-06-04 (×5): 2 via ORAL
  Filled 2016-06-03 (×2): qty 2
  Filled 2016-06-03: qty 1
  Filled 2016-06-03 (×2): qty 2
  Filled 2016-06-03: qty 1

## 2016-06-03 NOTE — Progress Notes (Signed)
Subjective:  Febrile again last night   Antibiotics:  Anti-infectives    Start     Dose/Rate Route Frequency Ordered Stop   06/03/16 1000  sulfamethoxazole-trimethoprim (BACTRIM DS,SEPTRA DS) 800-160 MG per tablet 2 tablet     2 tablet Oral 3 times daily 06/03/16 0758     06/02/16 0900  sulfamethoxazole-trimethoprim (BACTRIM DS,SEPTRA DS) 800-160 MG per tablet 1 tablet  Status:  Discontinued     1 tablet Oral Once per day on Mon Wed Fri 06/01/16 1508 06/03/16 0758   06/01/16 1515  dolutegravir (TIVICAY) tablet 50 mg     50 mg Oral Daily 06/01/16 1508     06/01/16 1515  emtricitabine-tenofovir AF (DESCOVY) 200-25 MG per tablet 1 tablet     1 tablet Oral Daily 06/01/16 1508     06/01/16 1000  cefTRIAXone (ROCEPHIN) 1 g in dextrose 5 % 50 mL IVPB  Status:  Discontinued     1 g 100 mL/hr over 30 Minutes Intravenous Every 24 hours 06/01/16 0752 06/01/16 1729   06/01/16 0800  azithromycin (ZITHROMAX) tablet 500 mg  Status:  Discontinued     500 mg Oral Every 24 hours 06/01/16 0752 06/03/16 0759   06/01/16 0330  piperacillin-tazobactam (ZOSYN) IVPB 3.375 g     3.375 g 100 mL/hr over 30 Minutes Intravenous  Once 06/01/16 0324 06/01/16 0427   06/01/16 0330  vancomycin (VANCOCIN) IVPB 1000 mg/200 mL premix     1,000 mg 200 mL/hr over 60 Minutes Intravenous  Once 06/01/16 0324 06/01/16 0458      Medications: Scheduled Meds: . dolutegravir  50 mg Oral Daily  . emtricitabine-tenofovir AF  1 tablet Oral Daily  . enoxaparin (LOVENOX) injection  40 mg Subcutaneous Q24H  . predniSONE  40 mg Oral BID WC  . sulfamethoxazole-trimethoprim  2 tablet Oral TID   Continuous Infusions: . sodium chloride 50 mL/hr at 06/03/16 1149   PRN Meds:.acetaminophen **OR** acetaminophen, albuterol, guaiFENesin-dextromethorphan, ibuprofen, ondansetron **OR** ondansetron (ZOFRAN) IV    Objective: Weight change:   Intake/Output Summary (Last 24 hours) at 06/03/16 1329 Last data filed at  06/02/16 1542  Gross per 24 hour  Intake              135 ml  Output                0 ml  Net              135 ml   Blood pressure 125/80, pulse 66, temperature 97.8 F (36.6 C), temperature source Oral, resp. rate 16, height  (1.778 m), weight 225 lb (102.1 kg), SpO2 100 %. Temp:  [96 F (35.6 C)-103.2 F (39.6 C)] 97.8 F (36.6 C) (04/12 0501) Pulse Rate:  [62-107] 66 (04/12 0501) Resp:  [16-18] 16 (04/12 0501) BP: (119-137)/(68-80) 125/80 (04/12 0501) SpO2:  [96 %-100 %] 100 % (04/12 0501)  Physical Exam: General: Alert and awake, oriented x3, not in any acute distress. HEENT: anicteric sclera, EOMI, no thrush CVS regular rate, normal r,  no murmur rubs or gallops Chest: clear to auscultation bilaterally, no wheezing, rales or rhonchi Abdomen: soft nontender, nondistended, normal bowel sounds, Extremities: no  clubbing or edema noted bilaterally Skin: no rashes Neuro: nonfocal  CBC:  CBC Latest Ref Rng & Units 06/03/2016 06/02/2016 06/01/2016  WBC 4.0 - 10.5 K/uL 5.4 8.3 7.6  Hemoglobin 13.0 - 17.0 g/dL 10.4(L) 10.6(L) 11.1(L)  Hematocrit 39.0 - 52.0 % 31.4(L) 32.1(L) 32.9(L)  Platelets 150 - 400 K/uL 252 231 258      BMET  Recent Labs  06/01/16 0004 06/02/16 0616  NA 135 135  K 3.1* 3.5  CL 100* 104  CO2 24 18*  GLUCOSE 113* 108*  BUN 9 6  CREATININE 1.29* 1.13  CALCIUM 8.9 8.3*     Liver Panel   Recent Labs  06/01/16 0004  PROT 8.7*  ALBUMIN 3.6  AST 44*  ALT 22  ALKPHOS 52  BILITOT 0.5       Sedimentation Rate No results for input(s): ESRSEDRATE in the last 72 hours. C-Reactive Protein No results for input(s): CRP in the last 72 hours.  Micro Results: Recent Results (from the past 720 hour(s))  Urine culture     Status: Abnormal   Collection Time: 06/01/16 12:03 AM  Result Value Ref Range Status   Specimen Description URINE, CLEAN CATCH  Final   Special Requests ADDED [phone]  Final   Culture MULTIPLE SPECIES PRESENT, SUGGEST  RECOLLECTION (A)  Final   Report Status 06/02/2016 FINAL  Final  Blood Culture (routine x 2)     Status: None (Preliminary result)   Collection Time: 06/01/16  3:24 AM  Result Value Ref Range Status   Specimen Description BLOOD LEFT ARM  Final   Special Requests   Final    BOTTLES DRAWN AEROBIC AND ANAEROBIC Blood Culture adequate volume   Culture NO GROWTH 2 DAYS  Final   Report Status PENDING  Incomplete  Blood Culture (routine x 2)     Status: None (Preliminary result)   Collection Time: 06/01/16  4:06 AM  Result Value Ref Range Status   Specimen Description BLOOD LEFT ARM  Final   Special Requests   Final    BOTTLES DRAWN AEROBIC AND ANAEROBIC Blood Culture adequate volume   Culture NO GROWTH 2 DAYS  Final   Report Status PENDING  Incomplete  Respiratory Panel by PCR     Status: Abnormal   Collection Time: 06/01/16 12:57 PM  Result Value Ref Range Status   Adenovirus NOT DETECTED NOT DETECTED Final   Coronavirus 229E NOT DETECTED NOT DETECTED Final   Coronavirus HKU1 NOT DETECTED NOT DETECTED Final   Coronavirus NL63 NOT DETECTED NOT DETECTED Final   Coronavirus OC43 NOT DETECTED NOT DETECTED Final   Metapneumovirus NOT DETECTED NOT DETECTED Final   Rhinovirus / Enterovirus DETECTED (A) NOT DETECTED Final   Influenza A NOT DETECTED NOT DETECTED Final   Influenza B NOT DETECTED NOT DETECTED Final   Parainfluenza Virus 1 NOT DETECTED NOT DETECTED Final   Parainfluenza Virus 2 NOT DETECTED NOT DETECTED Final   Parainfluenza Virus 3 NOT DETECTED NOT DETECTED Final   Parainfluenza Virus 4 NOT DETECTED NOT DETECTED Final   Respiratory Syncytial Virus NOT DETECTED NOT DETECTED Final   Bordetella pertussis NOT DETECTED NOT DETECTED Final   Chlamydophila pneumoniae NOT DETECTED NOT DETECTED Final   Mycoplasma pneumoniae NOT DETECTED NOT DETECTED Final    Studies/Results: Ct Chest W Contrast  Result Date: 06/02/2016 CLINICAL DATA:  Chest pain, shortness of breath, cough x1 week.  EXAM: CT CHEST WITH CONTRAST TECHNIQUE: Multidetector CT imaging of the chest was performed during intravenous contrast administration. CONTRAST:  75mL ISOVUE-300 IOPAMIDOL (ISOVUE-300) INJECTION 61% COMPARISON:  Chest radiograph dated 06/01/2016 FINDINGS: Cardiovascular: Heart is normal in size.  No pericardial effusion. No evidence of thoracic aortic aneurysm. Mediastinum/Nodes: Small prevascular lymph nodes measuring up to 8 mm short axis, within normal limits. Visualized thyroid is  unremarkable. Lungs/Pleura: Multifocal patchy airspace opacities in the lungs bilaterally, with a perihilar/ central distribution, with subpleural clearing. Upper lobe predominance. No pleural effusions. No associated cystic changes. Given the clinical history of HIV/AIDS, this appearance is worrisome for atypical infection such as pneumocystis jirovecii pneumonia. No pneumothorax. Upper Abdomen: Visualized upper abdomen is notable for a 1.6 cm cyst in the central liver (series 201/ image 100), benign. Musculoskeletal: Visualized osseous structures are within normal limits. IMPRESSION: Multifocal patchy opacities bilaterally, worrisome for atypical infection such as pneumocystis jirovecii pneumonia. Electronically Signed   By: Charline Bills M.D.   On: 06/02/2016 19:01      Assessment/Plan:  INTERVAL HISTORY: As per panel positive for rhinovirus/enterovirus., Patient still febrile CT with diffuse infiltrates c/w PCP   Principal Problem:   Sepsis (HCC) Active Problems:   HIV disease (HCC)   Hypokalemia   Influenza-like illness   AIDS (acquired immune deficiency syndrome) (HCC)   FUO (fever of unknown origin)   Lung nodule   Homelessness    Robert Stout is a 41 y.o. male with  HIV that has been untreated now with AIDS admitted with fevers and cough found to have rhinovirus/enterovirus by PCR from respiratory panel currently on azithromycin and still febrile.  #1 PCP (likely)  --started on bactrim DS two PO  TID and prednisone  bid  He will need bactrim treatment at this dose (if tolerable) x 21 days  Steroid taper is   bid x 5 days  daily x 5 days  daily x 11 days  (he will need HELP with getting these meds covered or provided to him. Would see if pharmacy can provide via match)  DC zithromycin   #2 HIV and AIDS: Started TIVICAY and DESCOVY  And will need to take bactrim daily once he has finished rx for PCP  Will need to have [employer] and [name] done ASAP at DC  #3 Homelessness hoping [facility] help him. I got in touch with [name]. Marthann Schiller is also going to need pay stubs etc to enroll pt in bridge counselling program. Housing is unfortunately in shortage at present.   I spent greater than 35  minutes with the patient including greater than 50% of time in face to face counsel of the patient re PCP, HIV, AIDS< homelessness and in coordination of his care.        LOS: 2 days   Acey Lav 06/03/2016, 1:29 PM

## 2016-06-03 NOTE — Progress Notes (Signed)
CSW received consult regarding homelessness. Patient reported that he is homeless and has been staying in his car. He reports that he has utilized the homeless shelters and the [employer]. He plans on applying for Medicaid soon. CSW provided emergency resources, which patient accepted.   CSW signing off.   Osborne Casco Adrea Sherpa LCSWA [phone]

## 2016-06-03 NOTE — Progress Notes (Signed)
PROGRESS NOTE    Robert Stout  ZOX:[id number] DOB: [birth year] DOA: 06/01/2016 PCP: No PCP Per Patient     Brief Narrative:  Robert Stout is a 41 y.o. male with a past medical history significant for HIV, untreated who presents with fever and cough for 2 weeks. On presentation, patient had fevers of unknown origin. He was empirically started on Rocephin, azithromycin.  Assessment & Plan:   Principal Problem:   Sepsis (HCC) Active Problems:   HIV disease (HCC)   Hypokalemia   Influenza-like illness   AIDS (acquired immune deficiency syndrome) (HCC)   FUO (fever of unknown origin)   Lung nodule   Homelessness   Sepsis secondary to PCP  -Respiratory panel positive for Rhinovirus / Enterovirus  -Blood cultures negative to date -Urine culture contaminated with multiple sp  -CT chest showed multifocal patchy opacities bilaterally, worrisome for atypical infection such as pneumocystis jirovecii pneumonia  -ID following -Currently bactrim TID, prednisone   HIV/AIDS -CD4 count is 70 -ID following, started on tivicay, descovy   Homelessness -SW, CM consulted   DVT prophylaxis: lovenox Code Status: Full Family Communication: no family at bedside Disposition Plan: pending improvement   Consultants:   ID  Procedures:   None  Antimicrobials:  Anti-infectives    Start     Dose/Rate Route Frequency Ordered Stop   06/03/16 1000  sulfamethoxazole-trimethoprim (BACTRIM DS,SEPTRA DS) 800-160 MG per tablet 2 tablet     2 tablet Oral 3 times daily 06/03/16 0758     06/02/16 0900  sulfamethoxazole-trimethoprim (BACTRIM DS,SEPTRA DS) 800-160 MG per tablet 1 tablet  Status:  Discontinued     1 tablet Oral Once per day on Mon Wed Fri 06/01/16 1508 06/03/16 0758   06/01/16 1515  dolutegravir (TIVICAY) tablet 50 mg     50 mg Oral Daily 06/01/16 1508     06/01/16 1515  emtricitabine-tenofovir AF (DESCOVY) 200-25 MG per tablet 1 tablet     1 tablet Oral Daily 06/01/16 1508     06/01/16 1000  cefTRIAXone (ROCEPHIN) 1 g in dextrose 5 % 50 mL IVPB  Status:  Discontinued     1 g 100 mL/hr over 30 Minutes Intravenous Every 24 hours 06/01/16 0752 06/01/16 1729   06/01/16 0800  azithromycin (ZITHROMAX) tablet 500 mg  Status:  Discontinued     500 mg Oral Every 24 hours 06/01/16 0752 06/03/16 0759   06/01/16 0330  piperacillin-tazobactam (ZOSYN) IVPB 3.375 g     3.375 g 100 mL/hr over 30 Minutes Intravenous  Once 06/01/16 0324 06/01/16 0427   06/01/16 0330  vancomycin (VANCOCIN) IVPB 1000 mg/200 mL premix     1,000 mg 200 mL/hr over 60 Minutes Intravenous  Once 06/01/16 0324 06/01/16 0458          Subjective: No change in symptoms, continues to have dry cough. Wants to leave hospital but understands he is not well enough to leave yet.    Objective: Vitals:   06/02/16 1828 06/02/16 2101 06/02/16 2238 06/03/16 0501  BP:   119/77 125/80  Pulse:   62 66  Resp:   17 16  Temp: 99.8 F (37.7 C) 97.5 F (36.4 C) (!) 96 F (35.6 C) 97.8 F (36.6 C)  TempSrc: Oral Oral Axillary Oral  SpO2:   100% 100%  Weight:      Height:        Intake/Output Summary (Last 24 hours) at 06/03/16 1141 Last data filed at 06/02/16 1542  Gross per 24 hour  Intake  135 ml  Output                0 ml  Net              135 ml   Filed Weights   05/31/16 2335 06/01/16 0351 06/01/16 0417  Weight: 102.1 kg (225 lb) 102.9 kg (226 lb 14.4 oz) 102.1 kg (225 lb)    Examination:  General exam: Appears calm Respiratory system: Clear to auscultation. Respiratory effort normal. Cardiovascular system: S1 & S2 heard, regular.  Gastrointestinal system: Abdomen is nondistended, soft and nontender.  Central nervous system: Alert and oriented. No focal neurological deficits. Extremities: Symmetric 5 x 5 power. Skin: No rashes, lesions or ulcers Psychiatry: Judgement and insight appear normal. Mood & affect appropriate.   Data Reviewed: I have personally reviewed following labs  and imaging studies  CBC:  Recent Labs Lab 06/01/16 0004 06/02/16 0616 06/03/16 0521  WBC 7.6 8.3 5.4  NEUTROABS 5.5  --   --   HGB 11.1* 10.6* 10.4*  HCT 32.9* 32.1* 31.4*  MCV 89.9 89.4 89.0  PLT 258 231 252   Basic Metabolic Panel:  Recent Labs Lab 06/01/16 0004 06/01/16 0830 06/02/16 0616  NA 135  --  135  K 3.1*  --  3.5  CL 100*  --  104  CO2 24  --  18*  GLUCOSE 113*  --  108*  BUN 9  --  6  CREATININE 1.29*  --  1.13  CALCIUM 8.9  --  8.3*  MG  --  1.9  --    GFR: Estimated Creatinine Clearance: 100.9 mL/min (by C-G formula based on SCr of 1.13 mg/dL). Liver Function Tests:  Recent Labs Lab 06/01/16 0004  AST 44*  ALT 22  ALKPHOS 52  BILITOT 0.5  PROT 8.7*  ALBUMIN 3.6   No results for input(s): LIPASE, AMYLASE in the last 168 hours. No results for input(s): AMMONIA in the last 168 hours. Coagulation Profile: No results for input(s): INR, PROTIME in the last 168 hours. Cardiac Enzymes: No results for input(s): CKTOTAL, CKMB, CKMBINDEX, TROPONINI in the last 168 hours. BNP (last 3 results) No results for input(s): PROBNP in the last 8760 hours. HbA1C: No results for input(s): HGBA1C in the last 72 hours. CBG: No results for input(s): GLUCAP in the last 168 hours. Lipid Profile: No results for input(s): CHOL, HDL, LDLCALC, TRIG, CHOLHDL, LDLDIRECT in the last 72 hours. Thyroid Function Tests: No results for input(s): TSH, T4TOTAL, FREET4, T3FREE, THYROIDAB in the last 72 hours. Anemia Panel: No results for input(s): VITAMINB12, FOLATE, FERRITIN, TIBC, IRON, RETICCTPCT in the last 72 hours. Sepsis Labs:  Recent Labs Lab 06/01/16 0049 06/01/16 0338  LATICACIDVEN 1.77 0.65    Recent Results (from the past 240 hour(s))  Urine culture     Status: Abnormal   Collection Time: 06/01/16 12:03 AM  Result Value Ref Range Status   Specimen Description URINE, CLEAN CATCH  Final   Special Requests ADDED [phone]  Final   Culture MULTIPLE SPECIES  PRESENT, SUGGEST RECOLLECTION (A)  Final   Report Status 06/02/2016 FINAL  Final  Blood Culture (routine x 2)     Status: None (Preliminary result)   Collection Time: 06/01/16  3:24 AM  Result Value Ref Range Status   Specimen Description BLOOD LEFT ARM  Final   Special Requests   Final    BOTTLES DRAWN AEROBIC AND ANAEROBIC Blood Culture adequate volume   Culture NO GROWTH 1 DAY  Final   Report Status PENDING  Incomplete  Blood Culture (routine x 2)     Status: None (Preliminary result)   Collection Time: 06/01/16  4:06 AM  Result Value Ref Range Status   Specimen Description BLOOD LEFT ARM  Final   Special Requests   Final    BOTTLES DRAWN AEROBIC AND ANAEROBIC Blood Culture adequate volume   Culture NO GROWTH 1 DAY  Final   Report Status PENDING  Incomplete  Respiratory Panel by PCR     Status: Abnormal   Collection Time: 06/01/16 12:57 PM  Result Value Ref Range Status   Adenovirus NOT DETECTED NOT DETECTED Final   Coronavirus 229E NOT DETECTED NOT DETECTED Final   Coronavirus HKU1 NOT DETECTED NOT DETECTED Final   Coronavirus NL63 NOT DETECTED NOT DETECTED Final   Coronavirus OC43 NOT DETECTED NOT DETECTED Final   Metapneumovirus NOT DETECTED NOT DETECTED Final   Rhinovirus / Enterovirus DETECTED (A) NOT DETECTED Final   Influenza A NOT DETECTED NOT DETECTED Final   Influenza B NOT DETECTED NOT DETECTED Final   Parainfluenza Virus 1 NOT DETECTED NOT DETECTED Final   Parainfluenza Virus 2 NOT DETECTED NOT DETECTED Final   Parainfluenza Virus 3 NOT DETECTED NOT DETECTED Final   Parainfluenza Virus 4 NOT DETECTED NOT DETECTED Final   Respiratory Syncytial Virus NOT DETECTED NOT DETECTED Final   Bordetella pertussis NOT DETECTED NOT DETECTED Final   Chlamydophila pneumoniae NOT DETECTED NOT DETECTED Final   Mycoplasma pneumoniae NOT DETECTED NOT DETECTED Final       Radiology Studies: Ct Chest W Contrast  Result Date: 06/02/2016 CLINICAL DATA:  Chest pain, shortness of  breath, cough x1 week. EXAM: CT CHEST WITH CONTRAST TECHNIQUE: Multidetector CT imaging of the chest was performed during intravenous contrast administration. CONTRAST:  75mL ISOVUE-300 IOPAMIDOL (ISOVUE-300) INJECTION 61% COMPARISON:  Chest radiograph dated 06/01/2016 FINDINGS: Cardiovascular: Heart is normal in size.  No pericardial effusion. No evidence of thoracic aortic aneurysm. Mediastinum/Nodes: Small prevascular lymph nodes measuring up to 8 mm short axis, within normal limits. Visualized thyroid is unremarkable. Lungs/Pleura: Multifocal patchy airspace opacities in the lungs bilaterally, with a perihilar/ central distribution, with subpleural clearing. Upper lobe predominance. No pleural effusions. No associated cystic changes. Given the clinical history of HIV/AIDS, this appearance is worrisome for atypical infection such as pneumocystis jirovecii pneumonia. No pneumothorax. Upper Abdomen: Visualized upper abdomen is notable for a 1.6 cm cyst in the central liver (series 201/ image 100), benign. Musculoskeletal: Visualized osseous structures are within normal limits. IMPRESSION: Multifocal patchy opacities bilaterally, worrisome for atypical infection such as pneumocystis jirovecii pneumonia. Electronically Signed   By: Charline Bills M.D.   On: 06/02/2016 19:01      Scheduled Meds: . dolutegravir  50 mg Oral Daily  . emtricitabine-tenofovir AF  1 tablet Oral Daily  . enoxaparin (LOVENOX) injection  40 mg Subcutaneous Q24H  . predniSONE  40 mg Oral BID WC  . sulfamethoxazole-trimethoprim  2 tablet Oral TID   Continuous Infusions: . sodium chloride 100 mL/hr at 06/03/16 0119     LOS: 2 days    Time spent: 30 minutes   Noralee Stain, DO [facility] [email or web address] Password [facility] 06/03/2016, 11:41 AM

## 2016-06-04 LAB — BASIC METABOLIC PANEL
Anion gap: 9 (ref 5–15)
BUN: 11 mg/dL (ref 6–20)
CHLORIDE: 108 mmol/L (ref 101–111)
CO2: 18 mmol/L — AB (ref 22–32)
CREATININE: 1.03 mg/dL (ref 0.61–1.24)
Calcium: 8.9 mg/dL (ref 8.9–10.3)
GFR calc Af Amer: >60 mL/min (ref >60)
GFR calc non Af Amer: >60 mL/min (ref >60)
GLUCOSE: 107 mg/dL — AB (ref 65–99)
Potassium: 3.6 mmol/L (ref 3.5–5.1)
Sodium: 135 mmol/L (ref 135–145)

## 2016-06-04 LAB — CBC
HEMATOCRIT: 30.9 % — AB (ref 39.0–52.0)
Hemoglobin: 10.6 g/dL — ABNORMAL LOW (ref 13.0–17.0)
MCH: 29.7 pg (ref 26.0–34.0)
MCHC: 34.3 g/dL (ref 30.0–36.0)
MCV: 86.6 fL (ref 78.0–100.0)
Platelets: 311 10*3/uL (ref 150–400)
RBC: 3.57 MIL/uL — ABNORMAL LOW (ref 4.22–5.81)
RDW: 13 % (ref 11.5–15.5)
WBC: 7.1 10*3/uL (ref 4.0–10.5)

## 2016-06-04 MED ORDER — PREDNISONE 20 MG PO TABS: ORAL_TABLET | ORAL | 0 refills | Status: DC

## 2016-06-04 MED ORDER — DOLUTEGRAVIR SODIUM 50 MG PO TABS: 50.0000 mg | ORAL_TABLET | Freq: Every day | ORAL | 0 refills | Status: DC

## 2016-06-04 MED ORDER — SULFAMETHOXAZOLE-TRIMETHOPRIM 800-160 MG PO TABS: ORAL_TABLET | ORAL | 0 refills | Status: DC

## 2016-06-04 MED ORDER — EMTRICITABINE-TENOFOVIR AF 200-25 MG PO TABS: 1.0000 | ORAL_TABLET | Freq: Every day | ORAL | 0 refills | Status: DC

## 2016-06-04 NOTE — Care Management Note (Addendum)
Case Management Note  Patient Details  Name: Shlomie Romig MRN: [id number] Date of Birth: 1975-03-04  Subjective/Objective:      Admitted with sepsis, hx of 042.              Action/Plan: Plan is to d/c  today. Pt will f/u with [facility] to establish PCP/ post hospital f/u and medication needs, pt without insurance/PCP minimal income. Match letter given and explained to pt per CM. [facility] appointment in place on 06/07/2016 for [name]/ 042 meds.  Expected Discharge Date:                  Expected Discharge Plan:  Home/Self Care  In-House Referral:   CSW(HOMELESS), refer to CSW note  Discharge planning Services  CM Consult, [facility], Follow-up appt scheduled, MATCH Program (ID assisting pt with [name](042 medications per pt))  Status of Service:  Completed, signed off  If discussed at Long Length of Stay Meetings, dates discussed:    Additional Comments:  Epifanio Lesches, RN 06/04/2016, 3:33 PM

## 2016-06-04 NOTE — Progress Notes (Signed)
Subjective:  No fevers since starting treatment for PCP.   Antibiotics:  Anti-infectives    Start     Dose/Rate Route Frequency Ordered Stop   06/05/16 0000  emtricitabine-tenofovir AF (DESCOVY) 200-25 MG tablet     1 tablet Oral Daily 06/04/16 1346     06/05/16 0000  dolutegravir (TIVICAY) 50 MG tablet     50 mg Oral Daily 06/04/16 1346     06/04/16 0000  sulfamethoxazole-trimethoprim (BACTRIM DS,SEPTRA DS) 800-160 MG tablet        06/04/16 1346     06/03/16 1000  sulfamethoxazole-trimethoprim (BACTRIM DS,SEPTRA DS) 800-160 MG per tablet 2 tablet     2 tablet Oral 3 times daily 06/03/16 0758     06/02/16 0900  sulfamethoxazole-trimethoprim (BACTRIM DS,SEPTRA DS) 800-160 MG per tablet 1 tablet  Status:  Discontinued     1 tablet Oral Once per day on Mon Wed Fri 06/01/16 1508 06/03/16 0758   06/01/16 1515  dolutegravir (TIVICAY) tablet 50 mg     50 mg Oral Daily 06/01/16 1508     06/01/16 1515  emtricitabine-tenofovir AF (DESCOVY) 200-25 MG per tablet 1 tablet     1 tablet Oral Daily 06/01/16 1508     06/01/16 1000  cefTRIAXone (ROCEPHIN) 1 g in dextrose 5 % 50 mL IVPB  Status:  Discontinued     1 g 100 mL/hr over 30 Minutes Intravenous Every 24 hours 06/01/16 0752 06/01/16 1729   06/01/16 0800  azithromycin (ZITHROMAX) tablet 500 mg  Status:  Discontinued     500 mg Oral Every 24 hours 06/01/16 0752 06/03/16 0759   06/01/16 0330  piperacillin-tazobactam (ZOSYN) IVPB 3.375 g     3.375 g 100 mL/hr over 30 Minutes Intravenous  Once 06/01/16 0324 06/01/16 0427   06/01/16 0330  vancomycin (VANCOCIN) IVPB 1000 mg/200 mL premix     1,000 mg 200 mL/hr over 60 Minutes Intravenous  Once 06/01/16 0324 06/01/16 0458      Medications: Scheduled Meds: . dolutegravir  50 mg Oral Daily  . emtricitabine-tenofovir AF  1 tablet Oral Daily  . enoxaparin (LOVENOX) injection  40 mg Subcutaneous Q24H  . predniSONE  40 mg Oral BID WC  . sulfamethoxazole-trimethoprim  2 tablet Oral TID    Continuous Infusions:  PRN Meds:.acetaminophen **OR** acetaminophen, albuterol, guaiFENesin-dextromethorphan, ibuprofen, ondansetron **OR** ondansetron (ZOFRAN) IV    Objective: Weight change:   Intake/Output Summary (Last 24 hours) at 06/04/16 2030 Last data filed at 06/04/16 1506  Gross per 24 hour  Intake              590 ml  Output              250 ml  Net              340 ml   Blood pressure (!) 115/92, pulse 71, temperature 97.7 F (36.5 C), temperature source Oral, resp. rate 16, height  (1.778 m), weight 225 lb (102.1 kg), SpO2 98 %. Temp:  [97.7 F (36.5 C)-98.2 F (36.8 C)] 97.7 F (36.5 C) (04/13 1506) Pulse Rate:  [60-71] 71 (04/13 1506) Resp:  [16] 16 (04/13 1506) BP: (115-135)/(73-92) 115/92 (04/13 1506) SpO2:  [98 %-100 %] 98 % (04/13 1506)  Physical Exam: General: Alert and awake, oriented x3, not in any acute distress. HEENT: anicteric sclera, EOMI, no thrush CVS regular rate, normal r,  no murmur rubs or gallops Chest: clear to auscultation bilaterally, no wheezing, rales  or rhonchi Abdomen: soft nontender, nondistended, normal bowel sounds, Extremities: no  clubbing or edema noted bilaterally Skin: no rashes Neuro: nonfocal  CBC:  CBC Latest Ref Rng & Units 06/04/2016 06/03/2016 06/02/2016  WBC 4.0 - 10.5 K/uL 7.1 5.4 8.3  Hemoglobin 13.0 - 17.0 g/dL 10.6(L) 10.4(L) 10.6(L)  Hematocrit 39.0 - 52.0 % 30.9(L) 31.4(L) 32.1(L)  Platelets 150 - 400 K/uL 311 252 231      BMET  Recent Labs  06/02/16 0616 06/04/16 0556  NA 135 135  K 3.5 3.6  CL 104 108  CO2 18* 18*  GLUCOSE 108* 107*  BUN 6 11  CREATININE 1.13 1.03  CALCIUM 8.3* 8.9     Liver Panel  No results for input(s): PROT, ALBUMIN, AST, ALT, ALKPHOS, BILITOT, BILIDIR, IBILI in the last 72 hours.     Sedimentation Rate No results for input(s): ESRSEDRATE in the last 72 hours. C-Reactive Protein No results for input(s): CRP in the last 72 hours.  Micro  Results: Recent Results (from the past 720 hour(s))  Urine culture     Status: Abnormal   Collection Time: 06/01/16 12:03 AM  Result Value Ref Range Status   Specimen Description URINE, CLEAN CATCH  Final   Special Requests ADDED [phone]  Final   Culture MULTIPLE SPECIES PRESENT, SUGGEST RECOLLECTION (A)  Final   Report Status 06/02/2016 FINAL  Final  Blood Culture (routine x 2)     Status: None (Preliminary result)   Collection Time: 06/01/16  3:24 AM  Result Value Ref Range Status   Specimen Description BLOOD LEFT ARM  Final   Special Requests   Final    BOTTLES DRAWN AEROBIC AND ANAEROBIC Blood Culture adequate volume   Culture NO GROWTH 3 DAYS  Final   Report Status PENDING  Incomplete  Blood Culture (routine x 2)     Status: None (Preliminary result)   Collection Time: 06/01/16  4:06 AM  Result Value Ref Range Status   Specimen Description BLOOD LEFT ARM  Final   Special Requests   Final    BOTTLES DRAWN AEROBIC AND ANAEROBIC Blood Culture adequate volume   Culture NO GROWTH 3 DAYS  Final   Report Status PENDING  Incomplete  Respiratory Panel by PCR     Status: Abnormal   Collection Time: 06/01/16 12:57 PM  Result Value Ref Range Status   Adenovirus NOT DETECTED NOT DETECTED Final   Coronavirus 229E NOT DETECTED NOT DETECTED Final   Coronavirus HKU1 NOT DETECTED NOT DETECTED Final   Coronavirus NL63 NOT DETECTED NOT DETECTED Final   Coronavirus OC43 NOT DETECTED NOT DETECTED Final   Metapneumovirus NOT DETECTED NOT DETECTED Final   Rhinovirus / Enterovirus DETECTED (A) NOT DETECTED Final   Influenza A NOT DETECTED NOT DETECTED Final   Influenza B NOT DETECTED NOT DETECTED Final   Parainfluenza Virus 1 NOT DETECTED NOT DETECTED Final   Parainfluenza Virus 2 NOT DETECTED NOT DETECTED Final   Parainfluenza Virus 3 NOT DETECTED NOT DETECTED Final   Parainfluenza Virus 4 NOT DETECTED NOT DETECTED Final   Respiratory Syncytial Virus NOT DETECTED NOT DETECTED Final   Bordetella  pertussis NOT DETECTED NOT DETECTED Final   Chlamydophila pneumoniae NOT DETECTED NOT DETECTED Final   Mycoplasma pneumoniae NOT DETECTED NOT DETECTED Final    Studies/Results: No results found.    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 male with  HIV that has been untreated now with AIDS admitted with fevers and cough found to have rhinovirus/enterovirus by PCR from respiratory panel currently on azithromycin and still febrile.  #1 PCP (likely)  --started on bactrim DS two PO TID and prednisone  bid  He will need bactrim treatment at this dose (if tolerable) x 21 days  Steroid taper is   bid x 5 days  daily x 5 days  daily x 11 days  (he will need HELP with getting these meds covered or provided to him. Would see if pharmacy can provide via match)   #2 HIV and AIDS: Started TIVICAY and DESCOVY  And will need to take bactrim daily once he has finished rx for PCP  Will need to have [employer] and [name] done ASAP at [facility]  He has appt on Monday with [facility] and I will also be in the clinic that day.    #3 Homelessness  Marthann Schiller has worked on this as are others now at [facility]  I spent greater than 35  minutes with the patient including greater than 50% of time in face to face counsel of the patient re PCP, HIV, AIDS< homelessness and in coordination of his care.        LOS: 3 days   Acey Lav 06/04/2016, 8:30 PM

## 2016-06-04 NOTE — Progress Notes (Addendum)
CM received consult: Qualification for [facility] program for Bactrim and prednisone. CM spoke with pt and pt states he's without insurance and will need medication assistance.  States works part time and has no money to afford medications. CM explained MATCH program to pt and will offer MATCH Letter @ d/c to assist with medication cost. Gae Gallop RN,BSN,CM

## 2016-06-04 NOTE — Progress Notes (Signed)
Nsg Discharge Note  Admit Date:  06/01/2016 Discharge date: 06/04/2016   Zechariah Bissonnette to be D/C'd Home per MD order.  AVS completed.  Copy for chart, and copy for patient signed, and dated. Patient/caregiver able to verbalize understanding.  Discharge Medication: Allergies as of 06/04/2016   No Known Allergies     Medication List    STOP taking these medications   efavirenz-emtricitabine-tenofovir 600-200-300 MG tablet Commonly known as:  ATRIPLA     TAKE these medications   dolutegravir 50 MG tablet Commonly known as:  TIVICAY Take 1 tablet (50 mg total) by mouth daily. Start taking on:  06/05/2016   emtricitabine-tenofovir AF 200-25 MG tablet Commonly known as:  DESCOVY Take 1 tablet by mouth daily. Start taking on:  06/05/2016   predniSONE 20 MG tablet Commonly known as:  DELTASONE  twice daily x 5 days, then  once daily x 5 days, then  once daily x 11 days   sulfamethoxazole-trimethoprim 800-160 MG tablet Commonly known as:  BACTRIM DS,SEPTRA DS Three times daily for 21 days, then once daily       Discharge Assessment: Vitals:   06/04/16 0454 06/04/16 1506  BP: 135/75 (!) 115/92  Pulse: 60 71  Resp: 16 16  Temp: 97.9 F (36.6 C) 97.7 F (36.5 C)   Skin clean, dry and intact without evidence of skin break down, no evidence of skin tears noted. IV catheter discontinued intact. Site without signs and symptoms of complications - no redness or edema noted at insertion site, patient denies c/o pain - only slight tenderness at site.  Dressing with slight pressure applied.  D/c Instructions-Education: Discharge instructions given to patient/family with verbalized understanding. D/c education completed with patient/family including follow up instructions, medication list, d/c activities limitations if indicated, with other d/c instructions as indicated by MD - patient able to verbalize understanding, all questions fully answered. Patient instructed to return  to ED, call 911, or call MD for any changes in condition.  Patient escorted via WC, and D/C home via private auto.  Tatanisha Cuthbert Consuella Lose, RN 06/04/2016 5:25 PM

## 2016-06-04 NOTE — Discharge Summary (Addendum)
Physician Discharge Summary  Robert Stout ZOX:[id number] DOB: 1976-01-30 DOA: 06/01/2016  PCP: No PCP Per Patient  Admit date: 06/01/2016 Discharge date: 06/04/2016  Admitted From: Home (patient homeless) Disposition:  Same  Recommendations for Outpatient Follow-up:  1. Follow up with PCP in 1 week 2. Follow up with ID next week  3. Please obtain BMP/CBC in 1 week   Home Health: No  Equipment/Devices: None   Discharge Condition: Stable CODE STATUS: Full  Diet recommendation: General   Brief/Interim Summary: From H&P by Dr. Maryfrances Bunnell: Robert Stout is a 41 y.o. male with a past medical history significant for HIV, untreated who presents with fever and cough for 2 weeks. The patient was in his usual health until about two weeks ago when he developed fever, myalgias, headache, persistent cough, cough with chest pain.  This slowly worsened until he came to the ER today.  He had purulent sputum for a day or two, but this resolved.  No hemoptysis, sweats.  He has HIV, is off meds for at least 3 years because of cost. He was initially started on rocephin/azithromax for concern of pneumonia, although CXR did not reveal infiltrate. ID was consulted due to patient's untreated HIV, fevers. CT chest revealed bilateral infiltrate concerning for PCP. He was started on bactrim and prednisone. He was also started on anti-retrovirals. He was provided resources from [facility] and SW for both homelessness as well as medication assistance. On day of discharge, he was feeling better, breathing well on room air, afebrile.   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 [facility] program initiated for medication assistance.   HIV/AIDS -CD4 count is 70 -ID following, started on tivicay, descovy. Patient to follow with ID pharmacy for [name].   Homelessness -SW, CM consulted. Patient to bring paystubs (to [facility]) in order to enroll in bridge counseling program.   Discharge Instructions  Discharge Instructions    Call MD for:  difficulty breathing, headache or visual disturbances    Complete by:  As directed    Call MD for:  extreme fatigue    Complete by:  As directed    Call MD for:  hives    Complete by:  As directed    Call MD for:  persistant dizziness or light-headedness    Complete by:  As directed    Call MD for:  severe uncontrolled pain    Complete by:  As directed    Call MD for:  temperature >100.4    Complete by:  As directed    Diet general    Complete by:  As directed    Discharge instructions    Complete by:  As directed    You were cared for by a hospitalist during your hospital stay. If you have any questions about your discharge medications or the care you received while you were in the hospital after you are discharged, you  can call the unit and asked to speak with the hospitalist on call if the hospitalist that took care of you is not available. Once you are discharged, your primary care physician will handle any further medical issues. Please note that NO REFILLS for any discharge medications will be authorized once you are discharged, as it is imperative that you return to your primary care physician (or establish a relationship with a primary care physician if you do not have one) for your aftercare needs so that they can reassess your need for  medications and monitor your lab values.   Discharge instructions    Complete by:  As directed    Remember to bring your paystubs to [name]   Increase activity slowly    Complete by:  As directed      Allergies as of 06/04/2016   No Known Allergies     Medication List    STOP taking these medications   efavirenz-emtricitabine-tenofovir 600-200-300 MG tablet Commonly known as:  ATRIPLA     TAKE these medications   dolutegravir 50 MG tablet Commonly known as:  TIVICAY Take 1 tablet (50 mg total) by mouth daily. Start taking on:  06/05/2016   emtricitabine-tenofovir AF 200-25 MG tablet Commonly known as:  DESCOVY Take 1 tablet by mouth daily. Start taking on:  06/05/2016   predniSONE 20 MG tablet Commonly known as:  DELTASONE  twice daily x 5 days, then  once daily x 5 days, then  once daily x 11 days   sulfamethoxazole-trimethoprim 800-160 MG tablet Commonly known as:  BACTRIM DS,SEPTRA DS Three times daily for 21 days, then once daily      Follow-up Information    [facility] Follow up.   Why:  post hospital follow up scheduled for 06/07/2016 at 9:30 am with Scot Jun NP Contact information: [address] [place] [address] [phone]       Acey Lav, MD. Go to.   Specialty:  Infectious Diseases Why:  PHARMACY CLINIC appointment  Monday Jun 07, 2016 10:00 AM  Contact information: [address] [place] [address] [phone]          No Known Allergies  Consultations:  ID   Procedures/Studies: Dg Chest 2 View  Result Date: 06/01/2016 CLINICAL DATA:  Cough and fever.  Left-sided chest pain EXAM: CHEST  2 VIEW COMPARISON:  Chest radiograph 12/04/2010 FINDINGS: Cardiac silhouette remains upper limits normal. No focal airspace consolidation or pulmonary edema. No pneumothorax or pleural effusion. IMPRESSION: No focal cardiopulmonary disease. Electronically Signed    By: Deatra Robinson M.D.   On: 06/01/2016 00:35   Ct Chest W Contrast  Result Date: 06/02/2016 CLINICAL DATA:  Chest pain, shortness of breath, cough x1 week. EXAM: CT CHEST WITH CONTRAST TECHNIQUE: Multidetector CT imaging of the chest was performed during intravenous contrast administration. CONTRAST:  75mL ISOVUE-300 IOPAMIDOL (ISOVUE-300) INJECTION 61% COMPARISON:  Chest radiograph dated 06/01/2016 FINDINGS: Cardiovascular: Heart is normal in size.  No pericardial effusion. No evidence of thoracic aortic aneurysm. Mediastinum/Nodes: Small prevascular lymph nodes measuring up to 8 mm short axis, within normal limits. Visualized thyroid is unremarkable. Lungs/Pleura: Multifocal patchy airspace opacities in the lungs bilaterally, with a perihilar/ central distribution, with subpleural clearing. Upper lobe predominance. No pleural effusions. No associated cystic changes. Given the clinical history of HIV/AIDS, this appearance is worrisome for atypical infection such as pneumocystis jirovecii pneumonia. No pneumothorax. Upper Abdomen: Visualized upper abdomen is notable for a  1.6 cm cyst in the central liver (series 201/ image 100), benign. Musculoskeletal: Visualized osseous structures are within normal limits. IMPRESSION: Multifocal patchy opacities bilaterally, worrisome for atypical infection such as pneumocystis jirovecii pneumonia. Electronically Signed   By: Charline Bills M.D.   On: 06/02/2016 19:01      Discharge Exam: Vitals:   06/04/16 0454 06/04/16 1506  BP: 135/75 (!) 115/92  Pulse: 60 71  Resp: 16 16  Temp: 97.9 F (36.6 C) 97.7 F (36.5 C)   Vitals:   06/03/16 1444 06/03/16 2127 06/04/16 0454 06/04/16 1506  BP: (!) 127/93 128/73 135/75 (!) 115/92  Pulse: 80 71 60 71  Resp: 16 16 16 16   Temp: 98.3 F (36.8 C) 98.2 F (36.8 C) 97.9 F (36.6 C) 97.7 F (36.5 C)  TempSrc: Oral Oral  Oral  SpO2: 100% 100% 98% 98%  Weight:      Height:        General: Pt is alert, awake,  not in acute distress Cardiovascular: RRR, S1/S2 +, no rubs, no gallops Respiratory: CTA bilaterally, no wheezing, no rhonchi Abdominal: Soft, NT, ND, bowel sounds + Extremities: no edema, no cyanosis    The results of significant diagnostics from this hospitalization (including imaging, microbiology, ancillary and laboratory) are listed below for reference.     Microbiology: Recent Results (from the past 240 hour(s))  Urine culture     Status: Abnormal   Collection Time: 06/01/16 12:03 AM  Result Value Ref Range Status   Specimen Description URINE, CLEAN CATCH  Final   Special Requests ADDED [phone]  Final   Culture MULTIPLE SPECIES PRESENT, SUGGEST RECOLLECTION (A)  Final   Report Status 06/02/2016 FINAL  Final  Blood Culture (routine x 2)     Status: None (Preliminary result)   Collection Time: 06/01/16  3:24 AM  Result Value Ref Range Status   Specimen Description BLOOD LEFT ARM  Final   Special Requests   Final    BOTTLES DRAWN AEROBIC AND ANAEROBIC Blood Culture adequate volume   Culture NO GROWTH 3 DAYS  Final   Report Status PENDING  Incomplete  Blood Culture (routine x 2)     Status: None (Preliminary result)   Collection Time: 06/01/16  4:06 AM  Result Value Ref Range Status   Specimen Description BLOOD LEFT ARM  Final   Special Requests   Final    BOTTLES DRAWN AEROBIC AND ANAEROBIC Blood Culture adequate volume   Culture NO GROWTH 3 DAYS  Final   Report Status PENDING  Incomplete  Respiratory Panel by PCR     Status: Abnormal   Collection Time: 06/01/16 12:57 PM  Result Value Ref Range Status   Adenovirus NOT DETECTED NOT DETECTED Final   Coronavirus 229E NOT DETECTED NOT DETECTED Final   Coronavirus HKU1 NOT DETECTED NOT DETECTED Final   Coronavirus NL63 NOT DETECTED NOT DETECTED Final   Coronavirus OC43 NOT DETECTED NOT DETECTED Final   Metapneumovirus NOT DETECTED NOT DETECTED Final   Rhinovirus / Enterovirus DETECTED (A) NOT DETECTED Final   Influenza A NOT  DETECTED NOT DETECTED Final   Influenza B NOT DETECTED NOT DETECTED Final   Parainfluenza Virus 1 NOT DETECTED NOT DETECTED Final   Parainfluenza Virus 2 NOT DETECTED NOT DETECTED Final   Parainfluenza Virus 3 NOT DETECTED NOT DETECTED Final   Parainfluenza Virus 4 NOT DETECTED NOT DETECTED Final   Respiratory Syncytial Virus NOT DETECTED NOT DETECTED Final   Bordetella pertussis NOT DETECTED NOT DETECTED Final  Chlamydophila pneumoniae NOT DETECTED NOT DETECTED Final   Mycoplasma pneumoniae NOT DETECTED NOT DETECTED Final     Labs: BNP (last 3 results) No results for input(s): BNP in the last 8760 hours. Basic Metabolic Panel:  Recent Labs Lab 06/01/16 0004 06/01/16 0830 06/02/16 0616 06/04/16 0556  NA 135  --  135 135  K 3.1*  --  3.5 3.6  CL 100*  --  104 108  CO2 24  --  18* 18*  GLUCOSE 113*  --  108* 107*  BUN 9  --  6 11  CREATININE 1.29*  --  1.13 1.03  CALCIUM 8.9  --  8.3* 8.9  MG  --  1.9  --   --    Liver Function Tests:  Recent Labs Lab 06/01/16 0004  AST 44*  ALT 22  ALKPHOS 52  BILITOT 0.5  PROT 8.7*  ALBUMIN 3.6   No results for input(s): LIPASE, AMYLASE in the last 168 hours. No results for input(s): AMMONIA in the last 168 hours. CBC:  Recent Labs Lab 06/01/16 0004 06/02/16 0616 06/03/16 0521 06/04/16 0556  WBC 7.6 8.3 5.4 7.1  NEUTROABS 5.5  --   --   --   HGB 11.1* 10.6* 10.4* 10.6*  HCT 32.9* 32.1* 31.4* 30.9*  MCV 89.9 89.4 89.0 86.6  PLT 258 231 252 311   Cardiac Enzymes: No results for input(s): CKTOTAL, CKMB, CKMBINDEX, TROPONINI in the last 168 hours. BNP: Invalid input(s): POCBNP CBG: No results for input(s): GLUCAP in the last 168 hours. D-Dimer No results for input(s): DDIMER in the last 72 hours. Hgb A1c No results for input(s): HGBA1C in the last 72 hours. Lipid Profile No results for input(s): CHOL, HDL, LDLCALC, TRIG, CHOLHDL, LDLDIRECT in the last 72 hours. Thyroid function studies No results for input(s):  TSH, T4TOTAL, T3FREE, THYROIDAB in the last 72 hours.  Invalid input(s): FREET3 Anemia work up No results for input(s): VITAMINB12, FOLATE, FERRITIN, TIBC, IRON, RETICCTPCT in the last 72 hours. Urinalysis    Component Value Date/Time   COLORURINE YELLOW 06/01/2016 0003   APPEARANCEUR CLEAR 06/01/2016 0003   LABSPEC 1.023 06/01/2016 0003   PHURINE 5.0 06/01/2016 0003   GLUCOSEU NEGATIVE 06/01/2016 0003   HGBUR NEGATIVE 06/01/2016 0003   BILIRUBINUR NEGATIVE 06/01/2016 0003   KETONESUR NEGATIVE 06/01/2016 0003   PROTEINUR 30 (A) 06/01/2016 0003   UROBILINOGEN 1.0 12/25/2014 1725   NITRITE NEGATIVE 06/01/2016 0003   LEUKOCYTESUR NEGATIVE 06/01/2016 0003   Sepsis Labs Invalid input(s): PROCALCITONIN,  WBC,  LACTICIDVEN Microbiology Recent Results (from the past 240 hour(s))  Urine culture     Status: Abnormal   Collection Time: 06/01/16 12:03 AM  Result Value Ref Range Status   Specimen Description URINE, CLEAN CATCH  Final   Special Requests ADDED [phone]  Final   Culture MULTIPLE SPECIES PRESENT, SUGGEST RECOLLECTION (A)  Final   Report Status 06/02/2016 FINAL  Final  Blood Culture (routine x 2)     Status: None (Preliminary result)   Collection Time: 06/01/16  3:24 AM  Result Value Ref Range Status   Specimen Description BLOOD LEFT ARM  Final   Special Requests   Final    BOTTLES DRAWN AEROBIC AND ANAEROBIC Blood Culture adequate volume   Culture NO GROWTH 3 DAYS  Final   Report Status PENDING  Incomplete  Blood Culture (routine x 2)     Status: None (Preliminary result)   Collection Time: 06/01/16  4:06 AM  Result Value Ref Range  Status   Specimen Description BLOOD LEFT ARM  Final   Special Requests   Final    BOTTLES DRAWN AEROBIC AND ANAEROBIC Blood Culture adequate volume   Culture NO GROWTH 3 DAYS  Final   Report Status PENDING  Incomplete  Respiratory Panel by PCR     Status: Abnormal   Collection Time: 06/01/16 12:57 PM  Result Value Ref Range Status    Adenovirus NOT DETECTED NOT DETECTED Final   Coronavirus 229E NOT DETECTED NOT DETECTED Final   Coronavirus HKU1 NOT DETECTED NOT DETECTED Final   Coronavirus NL63 NOT DETECTED NOT DETECTED Final   Coronavirus OC43 NOT DETECTED NOT DETECTED Final   Metapneumovirus NOT DETECTED NOT DETECTED Final   Rhinovirus / Enterovirus DETECTED (A) NOT DETECTED Final   Influenza A NOT DETECTED NOT DETECTED Final   Influenza B NOT DETECTED NOT DETECTED Final   Parainfluenza Virus 1 NOT DETECTED NOT DETECTED Final   Parainfluenza Virus 2 NOT DETECTED NOT DETECTED Final   Parainfluenza Virus 3 NOT DETECTED NOT DETECTED Final   Parainfluenza Virus 4 NOT DETECTED NOT DETECTED Final   Respiratory Syncytial Virus NOT DETECTED NOT DETECTED Final   Bordetella pertussis NOT DETECTED NOT DETECTED Final   Chlamydophila pneumoniae NOT DETECTED NOT DETECTED Final   Mycoplasma pneumoniae NOT DETECTED NOT DETECTED Final     Time coordinating discharge: 45 minutes  SIGNED:  Noralee Stain, DO [facility] Pager [phone]  If 7PM-7AM, please contact night-coverage [email or web address] Password [email or web address] 06/04/2016, 5:25 PM

## 2016-06-06 LAB — CULTURE, BLOOD (ROUTINE X 2)
CULTURE: NO GROWTH
Culture: NO GROWTH
SPECIAL REQUESTS: ADEQUATE
SPECIAL REQUESTS: ADEQUATE

## 2016-06-07 ENCOUNTER — Other Ambulatory Visit: Payer: Self-pay | Admitting: *Deleted

## 2016-06-07 ENCOUNTER — Ambulatory Visit: Payer: Self-pay | Attending: Internal Medicine | Admitting: Physician Assistant

## 2016-06-07 ENCOUNTER — Ambulatory Visit (INDEPENDENT_AMBULATORY_CARE_PROVIDER_SITE_OTHER): Payer: Self-pay | Admitting: Pharmacist Clinician (PhC)/ Clinical Pharmacy Specialist

## 2016-06-07 ENCOUNTER — Telehealth: Payer: Self-pay | Admitting: *Deleted

## 2016-06-07 VITALS — BP 126/79 | HR 91 | Temp 97.9°F | Resp 18 | Ht 70.0 in | Wt 231.0 lb

## 2016-06-07 DIAGNOSIS — Z79899 Other long term (current) drug therapy: Secondary | ICD-10-CM | POA: Insufficient documentation

## 2016-06-07 DIAGNOSIS — Z590 Homelessness: Secondary | ICD-10-CM | POA: Insufficient documentation

## 2016-06-07 DIAGNOSIS — A419 Sepsis, unspecified organism: Secondary | ICD-10-CM

## 2016-06-07 DIAGNOSIS — Z8249 Family history of ischemic heart disease and other diseases of the circulatory system: Secondary | ICD-10-CM | POA: Insufficient documentation

## 2016-06-07 DIAGNOSIS — B20 Human immunodeficiency virus [HIV] disease: Secondary | ICD-10-CM

## 2016-06-07 DIAGNOSIS — B59 Pneumocystosis: Secondary | ICD-10-CM

## 2016-06-07 DIAGNOSIS — Z9889 Other specified postprocedural states: Secondary | ICD-10-CM | POA: Insufficient documentation

## 2016-06-07 DIAGNOSIS — Z833 Family history of diabetes mellitus: Secondary | ICD-10-CM | POA: Insufficient documentation

## 2016-06-07 MED ORDER — PREDNISONE 20 MG PO TABS: ORAL_TABLET | ORAL | 0 refills | Status: DC

## 2016-06-07 MED ORDER — EMTRICITABINE-TENOFOVIR AF 200-25 MG PO TABS: 1.0000 | ORAL_TABLET | Freq: Every day | ORAL | 5 refills | Status: DC

## 2016-06-07 MED ORDER — EMTRICITABINE-TENOFOVIR AF 200-25 MG PO TABS: 1.0000 | ORAL_TABLET | Freq: Every day | ORAL | 3 refills | Status: DC

## 2016-06-07 MED ORDER — SULFAMETHOXAZOLE-TRIMETHOPRIM 800-160 MG PO TABS: ORAL_TABLET | ORAL | 0 refills | Status: DC

## 2016-06-07 MED ORDER — DOLUTEGRAVIR SODIUM 50 MG PO TABS: 50.0000 mg | ORAL_TABLET | Freq: Every day | ORAL | 5 refills | Status: DC

## 2016-06-07 MED ORDER — BICTEGRAVIR-EMTRICITAB-TENOFOV 50-200-25 MG PO TABS: 1.0000 | ORAL_TABLET | Freq: Every day | ORAL | 0 refills | Status: DC

## 2016-06-07 MED ORDER — DOLUTEGRAVIR SODIUM 50 MG PO TABS: 50.0000 mg | ORAL_TABLET | Freq: Every day | ORAL | 3 refills | Status: DC

## 2016-06-07 MED FILL — predniSONE 20 MG TABS: 20 | 21 days supply | Qty: 41 | Fill #0

## 2016-06-07 MED FILL — BIKTARVY 50-200-25 MG TABS: 50-200-25 | 30 days supply | Qty: 30 | Fill #0

## 2016-06-07 MED FILL — SULFAMETHOXAZOLE/TMP DS TAB: 800-160 | 34 days supply | Qty: 76 | Fill #0

## 2016-06-07 NOTE — Telephone Encounter (Signed)
Thanks Michelle

## 2016-06-07 NOTE — Addendum Note (Signed)
Addended by: Nicholes Calamity on: 06/07/2016 12:17 PM   Modules accepted: Orders

## 2016-06-07 NOTE — Telephone Encounter (Signed)
[facility] [address] unable to fill prescriptions as no date of birth was supplied to the [facility] program. RN called the [facility] program, supplied/confirmed date of birth at 12-Dec-1975.  [facility] will have to escalate this to their Care Manager, should have it correct in their system within the hour.  RN notified pharmacy and left message for patient with the update. Andree Coss, RN

## 2016-06-07 NOTE — Progress Notes (Signed)
HPI: Robert Stout is a 41 y.o. male who is here for a visit with [place] to get the process started for [name] and [facility]  Allergies: No Known Allergies  Vitals: Temp: 97.9 F (36.6 C) (04/16 0952) Temp Source: Oral (04/16 0952) BP: 126/79 (04/16 0952) Pulse Rate: 91 (04/16 1610)  Past Medical History: Past Medical History:  Diagnosis Date  . HIV infection [facility]) Dx 2011    Social History: Social History   Social History  . Marital status: Single    Spouse name: N/A  . Number of children: N/A  . Years of education: N/A   Social History Main Topics  . Smoking status: Never Smoker  . Smokeless tobacco: Never Used  . Alcohol use No  . Drug use: No  . Sexual activity: Not on file   Other Topics Concern  . Not on file   Social History Narrative  . No narrative on file    Previous Regimen: ATP  Current Regimen: DTG/Descovy  Labs: CD4 T Cell Abs (/uL)  Date Value  06/01/2016 70 (L)  09/04/2014 460   Hepatitis B Surface Ag (no units)  Date Value  06/01/2016 Negative    CrCl: Estimated Creatinine Clearance: 112.1 mL/min (by C-G formula based on SCr of 1.03 mg/dL).  Lipids: No results found for: CHOL, TRIG, HDL, CHOLHDL, VLDL, LDLCALC  Assessment: Robert Stout was recently admitted for PCP PNA. He has been out care for a while after being released from prison. He said that he tried to re-established care but there was issue with cost? Explained to him that it should never be the case. He was started DTG/Descovy while in the hospital. At discharge, the was given the prescription for septra for his PCP but apparently, there was an issue with the prescription so he couldn't get it filled. We are trying to sort it out here today so he can go get that after the visit.   He is also out of his ART so we are doing both [employer] and [name] today. I should have applied him for the advancing access program to bridge him in the meantime. Left him a voicemail. Since he is homeless,  we will get [facility] to ship the ART here. When he runs out of his septra, he will call me back and we can see if we can get supply somewhere to bridge him. Since he is out of ART already and with PCP, I got approval for 30 days supply of Biktarvy through [facility] [employer]. He is going to pick up today. We can use DTG/Descovy later if we need to through [facility].   Rx will see him in about 1 month then he can see Dr. Daiva Stout. All appts are made  Recommendations:  Pick up septra today to cont treatment course Pick up 30d supply of Bitkarvy today at [facility] pharmacy [name] and [employer] today (DTG/Descovy) F/u with Rx in 1 mon F/u with Dr. Daiva Stout in June  Ulyses Southward, PharmD, BCPS, AAHIVP, CPP Clinical Infectious Disease Pharmacist [facility] 06/07/2016, 11:24 AM

## 2016-06-07 NOTE — Progress Notes (Signed)
Patient is here for FU  Patient denies pain at this time.  Patient has not taken medication today. Patient has not eaten today.  Patient has not started medications.

## 2016-06-07 NOTE — Patient Instructions (Addendum)
Come back and see me on 5/24 at 2 PM Come back and see Dr. Daiva Eves on 6/6 at [facility] will ship the meds her in a few days

## 2016-06-07 NOTE — Progress Notes (Signed)
Robert Stout  RUE:[id number]  JYN:[id number]  DOB - 01-24-1976  Chief Complaint  Patient presents with  . Hospitalization Follow-up       Subjective:   Robert Stout is a 41 y.o. male here today for establishment of care. He has a history of HIV disease. He was in incarcerated from [id number] and did not receive treatment at that time. It looked like he was seen once here in late 2015 and attempts were made at restarting anti-retroviral therapy. He has once again been off his medications. He presented to the hospital on 14 2018 with fever, cough with sputum production. His temperature was 103. Lactic acid was 1.77. His white blood cell count was 7.6. His heart rate was 107. His hemoglobin was 11. His potassium was 3.1. Chest x-ray showed no focal infiltrate he had bilateral infiltrates. He was diagnosed with early sepsis likely from pneumonia by the internal medicine team. The infectious disease team followed him as well. He was discharged on Bactrim and steroids.  Unfortunately he did not get his medications field over the weekend. He states that overall he feels a little bit better. He's been afebrile. His cough has improved. His breathing is stable. He is homeless. He does not have insurance. He has no appointment later today with infectious disease.  ROS: GEN: denies fever or chills, denies change in weight Skin: denies lesions or rashes HEENT: denies headache, earache, epistaxis, sore throat, or neck pain LUNGS: denies SHOB, dyspnea, PND, orthopnea CV: denies CP or palpitations ABD: denies abd pain, N or V EXT: denies muscle spasms or swelling; no pain in lower ext, no weakness NEURO: denies numbness or tingling, denies sz, stroke or TIA  ALLERGIES: No Known Allergies  PAST MEDICAL HISTORY: Past Medical History:  Diagnosis Date  . HIV infection [facility]) Dx 2011    PAST SURGICAL HISTORY: Past Surgical History:  Procedure Laterality Date  . TENDON REPAIR      MEDICATIONS  AT HOME: Prior to Admission medications   Medication Sig Start Date End Date Taking? Authorizing Provider  dolutegravir (TIVICAY) 50 MG tablet Take 1 tablet (50 mg total) by mouth daily. Patient not taking: Reported on 06/07/2016 06/05/16   Jordan Hawks, DO  emtricitabine-tenofovir AF (DESCOVY) 200-25 MG tablet Take 1 tablet by mouth daily. Patient not taking: Reported on 06/07/2016 06/05/16   Jordan Hawks, DO  predniSONE (DELTASONE) 20 MG tablet  twice daily x 5 days, then  once daily x 5 days, then  once daily x 11 days Patient not taking: Reported on 06/07/2016 06/04/16   Jordan Hawks, DO  sulfamethoxazole-trimethoprim (BACTRIM DS,SEPTRA DS) 800-160 MG tablet Three times daily for 21 days, then once daily Patient not taking: Reported on 06/07/2016 06/04/16   Jordan Hawks, DO    Family History  Problem Relation Age of Onset  . Diabetes Mother   . Hypertension Mother    Social-homeless, has a PT job doing Restaurant manager, fast food, nonsmoker, 4 children  Objective:   Vitals:   06/07/16 0952  BP: 126/79  Pulse: 91  Resp: 18  Temp: 97.9 F (36.6 C)  TempSrc: Oral  SpO2: 98%  Weight: 231 lb (104.8 kg)  Height:  (1.778 m)    Exam General appearance : Awake, alert, not in any distress. Speech Clear. Not toxic looking HEENT: Atraumatic and Normocephalic, pupils equally reactive to light and accomodation Neck: supple, no JVD. No cervical lymphadenopathy.  Chest:decreased breath sounds CVS: S1 S2 regular, no murmurs.  Abdomen:  Bowel sounds present, Non tender and not distended with no guarding, rigidity or rebound. Extremities: B/L Lower Ext shows no edema, both legs are warm to touch Neurology: Awake alert, and oriented X 3, CN II-XII intact, Non focal Skin:No Rash Wounds:N/A  Assessment & Plan  1. Sepsis  -obtain steroid and Bactrim today  -monitor for fevers  -keep appt with ID for later today  -labs in 1 week (CBC,  BMP)  2. HIV/AIDS  -keept appt with ID later today   -antiretrovirals to be filled at ID office today (we do not have here)   Return in about 1 week (around 06/14/2016).  The patient was given clear instructions to go to ER or return to medical center if symptoms don't improve, worsen or new problems develop. The patient verbalized understanding. The patient was told to call to get lab results if they haven't heard anything in the next week.   Total time spent with patient was 19  min. Greater than 50 % of this visit was spent face to face counseling and coordinating care regarding risk factor modification, compliance importance and encouragement, education related to HIV/AIDS and infections.  This note has been created with Education officer, environmental. Any transcriptional errors are unintentional.    Scot Jun, PA-C [facility] [place], [place] [phone]   06/07/2016, 10:04 AM

## 2016-06-09 LAB — HLA B*5701: HLA B 5701: NEGATIVE

## 2016-06-10 LAB — HIV-1 RNA ULTRAQUANT REFLEX TO GENTYP+
HIV-1 RNA BY PCR: 215000 copies/mL
HIV-1 RNA Quant, Log: 5.332 log10copy/mL

## 2016-06-13 LAB — HIV-1 RNA ULTRAQUANT REFLEX TO GENTYP+
HIV-1 RNA BY PCR: 374000 {copies}/mL
HIV-1 RNA QUANT, LOG: 5.573 {Log_copies}/mL

## 2016-06-14 LAB — REFLEX TO GENOSURE(R) MG

## 2016-06-15 ENCOUNTER — Telehealth: Payer: Self-pay | Admitting: Pharmacist Clinician (PhC)/ Clinical Pharmacy Specialist

## 2016-06-15 NOTE — Telephone Encounter (Signed)
Spoke  with [place] at [facility]. She said the Rx was shipped 8/16, and that the last status is 4/19 based on USPS tracking [phone]). It has been labelled as 'in transit' since then.

## 2016-06-17 ENCOUNTER — Encounter: Payer: Self-pay | Admitting: Infectious Disease

## 2016-06-18 ENCOUNTER — Telehealth: Payer: Self-pay | Admitting: Pharmacist Clinician (PhC)/ Clinical Pharmacy Specialist

## 2016-06-18 NOTE — Telephone Encounter (Signed)
Chart Review>>Media

## 2016-06-21 ENCOUNTER — Ambulatory Visit: Payer: Self-pay | Attending: Family Medicine | Admitting: Family Medicine

## 2016-06-21 ENCOUNTER — Encounter: Payer: Self-pay | Admitting: Family Medicine

## 2016-06-21 ENCOUNTER — Other Ambulatory Visit: Payer: Self-pay | Admitting: Family Medicine

## 2016-06-21 VITALS — BP 91/62 | HR 86 | Temp 98.3°F | Resp 18 | Ht 70.0 in | Wt 223.0 lb

## 2016-06-21 DIAGNOSIS — Z79899 Other long term (current) drug therapy: Secondary | ICD-10-CM | POA: Insufficient documentation

## 2016-06-21 DIAGNOSIS — Z590 Homelessness: Secondary | ICD-10-CM | POA: Insufficient documentation

## 2016-06-21 DIAGNOSIS — B20 Human immunodeficiency virus [HIV] disease: Secondary | ICD-10-CM | POA: Insufficient documentation

## 2016-06-21 DIAGNOSIS — Z09 Encounter for follow-up examination after completed treatment for conditions other than malignant neoplasm: Secondary | ICD-10-CM

## 2016-06-21 NOTE — Patient Instructions (Signed)
HIV Infection and AIDS HIV (human immunodeficiency virus) infection is a permanent (chronic) viral infection. HIV kills white blood cells that are called CD4 cells. These cells help to control the body's defense system (immune system) and fight infection. If a person does not have enough CD4 cells, he or she can develop infections, cancers, and other health problems. What are the causes? This condition is caused by HIV. This virus is passed from one person to another person:  Through sex.  Through contact with infected blood.  During childbirth or breastfeeding. What increases the risk? This condition is more likely to develop in people who:  Have unprotected sex.  Share needles or other drug equipment. What are the signs or symptoms? Symptoms of this condition usually develop in phases: Asymptomatic phase  You may not feel sick, or you may only feel sick some of the time. Many people in this phase do not know that they have HIV. Symptoms may include:  Low-grade fever.  Rash.  Fatigue.  Sore throat.  Headaches.  Nausea, vomiting, or diarrhea.  Night sweats. Early symptomatic phase  You may notice:  Your early symptoms getting worse or happening more often.  Oral, vaginal, or rectal sores that are caused by infections.  Problems that are related to inflammation, such as joint pain. Symptomatic phase (AIDS, or acquired immunodeficiency syndrome)  Your immune system no longer protects you from infections and other health problems. You may get infections that you would not normally get if your immune system was healthy and working properly (opportunistic diseases). Problems that are caused by opportunistic diseases include:  Coughing.  Trouble breathing.  Diarrhea.  Skin sores.  Trouble swallowing.  High fevers.  Blurred vision.  Stiff neck.  Mental confusion. You may also begin to notice:  Weight loss.  Tingling or pain in your hands and feet.  Mouth  sores or tooth pain.  Severe fatigue. How is this diagnosed? This condition is diagnosed with:  A screening test to check blood for a chemical (antibody) that is produced only when the body is fighting HIV.  A blood test to confirm the presence of HIV. How is this treated? There is no cure for this condition, but treatment can help to keep HIV from getting worse.  You will be given medicines that may slow down the rate at which HIV multiplies in your body (antiretroviral therapy, or ART). ART may:  Keep your immune system as healthy as possible and help it work better.  Decrease the amount of HIV in your body.  Reduce the risk of problems caused by HIV.  Prolong your life.  Improve the quality of your life.  Help prevent passing HIV to someone.  You will need to have routine lab tests performed to monitor your treatment and immune system. Follow these instructions at home: Medicines   Take over-the-counter and prescription medicines only as told by your health care provider.  If you were prescribed an antibiotic medicine, take it as told by your health care provider. Do not stop taking the antibiotic even if you start to feel better. Lifestyle   Stop or decrease your use of alcohol and recreational drugs, which can cause further damage to your immune system. They can also cause problems with your liver, lungs, and heart.  Do not use any products that contain nicotine or tobacco, such as cigarettes and e-cigarettes. If you need help quitting, ask your health care provider.  Do not share needles or other equipment that is  used for injecting, smoking, or snorting drugs.  Protect yourself from other STIs (sexually transmitted infections) by using condoms when you have sex. This includes vaginal, oral, and anal sex.  Eat in a healthy way, get enough sleep, and exercise. General instructions   Tell your sexual partners that you have HIV. Encourage them to get tested.  Keep  your vaccinations up to date. Make sure that you get all recommended vaccines, including vaccines for hepatitis A, hepatitis B, measles, and influenza.  See your dentist regularly. Brush and floss your teeth every day.  See a counselor or a Child psychotherapist to help you solve problems and find any services that you need.  Get support from your family and friends.  Keep all follow-up visits as told by your health care provider. This is important. You will need to have routine blood tests every 3-6 months to monitor your health and to make sure your treatment is working. How is this prevented? To prevent the spread of HIV:  Talk with your health care provider about protecting your sexual partners from HIV. Your health care provider may encourage your partner to take medicines to decrease the risk of getting HIV (pre-exposure prophylaxis, or PrEP).  Use a condom every time you have sex. This includes vaginal, oral, and anal sex.  The condom should be in place from the beginning of the sexual activity to the end.  Use only latex or polyurethane condoms and water-based lubricants.  Wearing a condom reduces, but does not completely eliminate, your risk of spreading HIV.  Condoms also protect you from other STIs.  Avoid alcohol and recreational drugs that affect your judgment. They may make you forget to use a condom or may increase your chances of participating in high-risk sex.  Do not share equipment that is used to take drugs, such as needles, syringes, cookers, tourniquets, pipes, or straws. If you share equipment, clean it before and after you use it. Contact a health care provider if:  You lose a lot of weight.  You have extreme fatigue.  You have trouble swallowing.  You have vomiting or diarrhea that does not get better.  You have muscle pain or joint pain.  You have any problems that are related to your medicines. Get help right away if:  You have a rash that causes your skin  to peel.  You develop blisters inside your mouth.  You have pain in your abdomen.  You have swelling around your eyes, or you have eye redness.  You have a high fever and chills.  You have shortness of breath.  You have a cough that is dry (nonproductive) or wet (productive).  You have vision problems, such as blind spots, flashing lights, or decreased or blurred vision.  You have a persistent headache, confusion, or changes in the way that you think, feel, or behave (altered mental status). This information is not intended to replace advice given to you by your health care provider. Make sure you discuss any questions you have with your health care provider. Document Released: 11/23/2013 Document Revised: 08/29/2015 Document Reviewed: 07/28/2015 Elsevier Interactive Patient Education  2017 [employer].

## 2016-06-21 NOTE — Progress Notes (Signed)
Patient is here for f/up   Patient denies pain for today  

## 2016-06-21 NOTE — Progress Notes (Signed)
Subjective:  Patient ID: Robert Stout, male    DOB: 1975-07-25  Age: 41 y.o. MRN: [id number]  CC: Establish Care   HPI Robert Stout presents for   Follow up: History of HIV infection. recently diagnosed with AIDS. Currently being followed by infectious disease. Chronic disease management is complicated by homelessness. Reports history of homelessness for 3 years. History of incarceration.  He reports being  from [place] denies any family support in [place]. Denies any SI/HI. Reports adherence with medication regimen.    Outpatient Medications Prior to Visit  Medication Sig Dispense Refill  . predniSONE (DELTASONE) 20 MG tablet  twice daily x 5 days, then  once daily x 5 days, then  once daily x 11 days 41 tablet 0  . sulfamethoxazole-trimethoprim (BACTRIM DS,SEPTRA DS) 800-160 MG tablet Three times daily for 21 days, then once daily 76 tablet 0  . bictegravir-emtricitabine-tenofovir AF (BIKTARVY) 50-200-25 MG TABS tablet Take 1 tablet by mouth daily. 30 tablet 0  . dolutegravir (TIVICAY) 50 MG tablet Take 1 tablet (50 mg total) by mouth daily. 30 tablet 3  . emtricitabine-tenofovir AF (DESCOVY) 200-25 MG tablet Take 1 tablet by mouth daily. 30 tablet 3  . dolutegravir (TIVICAY) 50 MG tablet Take 1 tablet (50 mg total) by mouth daily. 30 tablet 5  . emtricitabine-tenofovir AF (DESCOVY) 200-25 MG tablet Take 1 tablet by mouth daily. 30 tablet 5   No facility-administered medications prior to visit.     ROS Review of Systems  Constitutional: Negative.   Respiratory: Negative.   Cardiovascular: Negative.   Skin: Negative.   Neurological: Negative.     Objective:  BP 91/62 (BP Location: Left Arm, Patient Position: Sitting, Cuff Size: Normal)   Pulse 86   Temp 98.3 F (36.8 C) (Oral)   Resp 18   Ht  (1.778 m)   Wt 223 lb (101.2 kg)   SpO2 97%   BMI 32.00 kg/m   BP/Weight 06/21/2016 06/07/2016 06/04/2016  Systolic BP 91 126 115  Diastolic BP 62 79 92  Wt. (Lbs)  223 231 -  BMI 32 33.15 -     Physical Exam  Constitutional: He is oriented to person, place, and time. He appears well-developed and well-nourished.  Eyes: Conjunctivae are normal. Pupils are equal, round, and reactive to light.  Neck: Normal range of motion.  Cardiovascular: Normal rate, regular rhythm, normal heart sounds and intact distal pulses.   Pulmonary/Chest: Effort normal and breath sounds normal.  Abdominal: Soft. Bowel sounds are normal. There is no tenderness.  Lymphadenopathy:    He has cervical adenopathy.  Neurological: He is alert and oriented to person, place, and time.  Skin: Skin is warm and dry.  Psychiatric: His affect is blunt. His speech is delayed. He is withdrawn. He expresses no homicidal and no suicidal ideation. He expresses no suicidal plans and no homicidal plans.  Nursing note and vitals reviewed.   Assessment & Plan:   Problem List Items Addressed This Visit      Other   AIDS (acquired immune deficiency syndrome) (HCC)    Other Visit DNur Krasinski   Follow up    -  Primary   - Will refer to case management for homelessness resources.    Relevant Orders   CBC with Differential (Completed)   Basic metabolic panel (Completed)        Follow-up: Return in about 1 month (around 07/21/2016), or if symptoms worsen or fail to improve, for Physical.   Dreya Buhrman R  Braulio Conte FNP

## 2016-06-22 ENCOUNTER — Telehealth: Payer: Self-pay

## 2016-06-22 LAB — CBC WITH DIFFERENTIAL/PLATELET
BASOS ABS: 0 10*3/uL (ref 0.0–0.2)
Basos: 0 %
EOS (ABSOLUTE): 0 10*3/uL (ref 0.0–0.4)
EOS: 0 %
HEMATOCRIT: 38.7 % (ref 37.5–51.0)
Hemoglobin: 13 g/dL (ref 13.0–17.7)
IMMATURE GRANULOCYTES: 1 %
Immature Grans (Abs): 0.1 10*3/uL (ref 0.0–0.1)
LYMPHS ABS: 1.1 10*3/uL (ref 0.7–3.1)
Lymphs: 14 %
MCH: 30.5 pg (ref 26.6–33.0)
MCHC: 33.6 g/dL (ref 31.5–35.7)
MCV: 91 fL (ref 79–97)
MONOS ABS: 0.4 10*3/uL (ref 0.1–0.9)
Monocytes: 4 %
Neutrophils Absolute: 6.5 10*3/uL (ref 1.4–7.0)
Neutrophils: 81 %
Platelets: 253 10*3/uL (ref 150–379)
RBC: 4.26 x10E6/uL (ref 4.14–5.80)
RDW: 16.1 % — AB (ref 12.3–15.4)
WBC: 8 10*3/uL (ref 3.4–10.8)

## 2016-06-22 LAB — BASIC METABOLIC PANEL
BUN/Creatinine Ratio: 12 (ref 9–20)
BUN: 15 mg/dL (ref 6–24)
CALCIUM: 10 mg/dL (ref 8.7–10.2)
CHLORIDE: 96 mmol/L (ref 96–106)
CO2: 26 mmol/L (ref 18–29)
Creatinine, Ser: 1.23 mg/dL (ref 0.76–1.27)
GFR calc Af Amer: 83 mL/min/{1.73_m2} (ref >59)
GFR calc non Af Amer: 71 mL/min/{1.73_m2} (ref >59)
GLUCOSE: 109 mg/dL — AB (ref 65–99)
POTASSIUM: 5.3 mmol/L — AB (ref 3.5–5.2)
SODIUM: 134 mmol/L (ref 134–144)

## 2016-06-22 NOTE — Telephone Encounter (Signed)
Thank you Jane! 

## 2016-06-22 NOTE — Telephone Encounter (Signed)
Message received from [facility], FNP, requesting this CM contact the patient to discuss resources for the homeless.  Call placed to the patient. He said that he has been staying in a car; but  not his car.  He noted that he is aware of the [facility] and has been going there every other day. He said that he has spoken to them about his need for housing and they provided him with a list of apartments available.  He said that he called some of the places but nothing is available but he has not called every place that was on the list. Encouraged him to continue to follow up with the [facility].  He noted  that he does have an small income as he is working part time. This CM provided him with the contact # for the [facility] # [phone] and encouraged him to contact them.  He said that he has information about the food pantries in [place] as well as where he can go to get free meals during the day and he was very appreciative of the call.

## 2016-06-23 ENCOUNTER — Other Ambulatory Visit: Payer: Self-pay | Admitting: Pharmacist Clinician (PhC)/ Clinical Pharmacy Specialist

## 2016-06-23 MED ORDER — BICTEGRAVIR-EMTRICITAB-TENOFOV 50-200-25 MG PO TABS: 1.0000 | ORAL_TABLET | Freq: Every day | ORAL | 6 refills | Status: DC

## 2016-06-23 MED ORDER — SULFAMETHOXAZOLE-TRIMETHOPRIM 400-80 MG PO TABS: 1.0000 | ORAL_TABLET | Freq: Every day | ORAL | 6 refills | Status: DC

## 2016-06-23 NOTE — Progress Notes (Unsigned)
[name] is now approved. Rx sent to [facility] in [place] to be mailed here to clinic.

## 2016-06-24 ENCOUNTER — Encounter: Payer: Self-pay | Admitting: *Deleted

## 2016-06-25 ENCOUNTER — Telehealth: Payer: Self-pay

## 2016-06-25 NOTE — Telephone Encounter (Signed)
-----   Message from Lizbeth BarkMandesia R Hairston, FNP sent at 06/25/2016  1:17 PM EDT ----- No signs of anemia present.  Kidney function normal Lab that evaluated your fluid balance and electrolytes are normal.

## 2016-06-25 NOTE — Telephone Encounter (Signed)
CMA call patient regarding lab results   Patient Verify DOB   Patient was aware and understood  

## 2016-07-15 ENCOUNTER — Ambulatory Visit (INDEPENDENT_AMBULATORY_CARE_PROVIDER_SITE_OTHER): Payer: Self-pay | Admitting: Pharmacist Clinician (PhC)/ Clinical Pharmacy Specialist

## 2016-07-15 DIAGNOSIS — Z23 Encounter for immunization: Secondary | ICD-10-CM

## 2016-07-15 DIAGNOSIS — B20 Human immunodeficiency virus [HIV] disease: Secondary | ICD-10-CM

## 2016-07-15 DIAGNOSIS — Z79899 Other long term (current) drug therapy: Secondary | ICD-10-CM

## 2016-07-15 LAB — COMPLETE METABOLIC PANEL WITH GFR
ALT: 17 U/L (ref 9–46)
AST: 19 U/L (ref 10–40)
Albumin: 3.9 g/dL (ref 3.6–5.1)
Alkaline Phosphatase: 53 U/L (ref 40–115)
BUN: 7 mg/dL (ref 7–25)
CALCIUM: 9.3 mg/dL (ref 8.6–10.3)
CHLORIDE: 104 mmol/L (ref 98–110)
CO2: 27 mmol/L (ref 20–31)
Creat: 1.24 mg/dL (ref 0.60–1.35)
GFR, EST NON AFRICAN AMERICAN: 71 mL/min (ref >=60)
GFR, Est African American: 82 mL/min (ref >=60)
Glucose, Bld: 80 mg/dL (ref 65–99)
Potassium: 3.9 mmol/L (ref 3.5–5.3)
SODIUM: 138 mmol/L (ref 135–146)
Total Bilirubin: 0.4 mg/dL (ref 0.2–1.2)
Total Protein: 7.2 g/dL (ref 6.1–8.1)

## 2016-07-15 LAB — CBC
HCT: 38.7 % (ref 38.5–50.0)
HEMOGLOBIN: 12.7 g/dL — AB (ref 13.2–17.1)
MCH: 30.8 pg (ref 27.0–33.0)
MCHC: 32.8 g/dL (ref 32.0–36.0)
MCV: 93.7 fL (ref 80.0–100.0)
MPV: 8.7 fL (ref 7.5–12.5)
Platelets: 318 10*3/uL (ref 140–400)
RBC: 4.13 MIL/uL — AB (ref 4.20–5.80)
RDW: 16.3 % — ABNORMAL HIGH (ref 11.0–15.0)
WBC: 4.2 10*3/uL (ref 3.8–10.8)

## 2016-07-15 LAB — LIPID PANEL
CHOL/HDL RATIO: 5.7 ratio — AB (ref <5.0)
CHOLESTEROL: 206 mg/dL — AB (ref <200)
HDL: 36 mg/dL — AB (ref >40)
LDL CALC: 128 mg/dL — AB (ref <100)
TRIGLYCERIDES: 209 mg/dL — AB (ref <150)
VLDL: 42 mg/dL — ABNORMAL HIGH (ref <30)

## 2016-07-15 NOTE — Progress Notes (Signed)
HPI: Robert Stout is a 41 y.o. male who is here to see pharmacy for his HIV follow up.   Allergies: No Known Allergies  Vitals:    Past Medical History: Past Medical History:  Diagnosis Date  . HIV infection [facility](HCC) Dx 2011    Social History: Social History   Social History  . Marital status: Single    Spouse name: N/A  . Number of children: N/A  . Years of education: N/A   Social History Main Topics  . Smoking status: Never Smoker  . Smokeless tobacco: Never Used  . Alcohol use No  . Drug use: No  . Sexual activity: Not on file   Other Topics Concern  . Not on file   Social History Narrative  . No narrative on file    Previous Regimen: ATP, Descovy/DTG  Current Regimen: Biktarvy  Labs: CD4 T Cell Abs (/uL)  Date Value  06/01/2016 70 (L)  09/04/2014 460   Hepatitis B Surface Ag (no units)  Date Value  06/01/2016 Negative    CrCl: CrCl cannot be calculated (Patient's most recent lab result is older than the maximum 21 days allowed.).  Lipids: No results found for: CHOL, TRIG, HDL, CHOLHDL, VLDL, LDLCALC  Assessment: Robert Stout is doing very well on his regimen of Biktarvy. He has not missed any doses. He still lives in his car and is working in Engineer, drillingjanitorial service at [employer]. Currently he is only on Biktarvy and septra for his HIV and PCP prophylaxis. We are going to get his labs today before his visit with Dr Daiva EvesVan Dam in June.   He needs several vaccines (hep A/B/Menveo/PNA). Gave him all 4 today. Rx will schedule him for the rest of the vaccines.   Recommendations:  Continue Biktarvy 1 PO daily Cont Septra SS 1 PO qday Hep A #1, Hep B#1, Menveo #1, PNA today HIV labs today F/u in 1 month for second hep B  Ulyses SouthwardMinh Nelli Swalley, PharmD, BCPS, AAHIVP, CPP Clinical Infectious Disease Pharmacist [facility] 07/15/2016, 3:03 PM

## 2016-07-16 LAB — T-HELPER CELL (CD4) - (RCID CLINIC ONLY)
CD4 T CELL HELPER: 13 % — AB (ref 33–55)
CD4 T Cell Abs: 330 /uL — ABNORMAL LOW (ref 400–2700)

## 2016-07-21 ENCOUNTER — Ambulatory Visit: Payer: Self-pay | Attending: Family Medicine | Admitting: Family Medicine

## 2016-07-21 ENCOUNTER — Encounter: Payer: Self-pay | Admitting: Family Medicine

## 2016-07-21 VITALS — BP 105/72 | HR 84 | Temp 98.1°F | Resp 18 | Ht 70.0 in | Wt 239.8 lb

## 2016-07-21 DIAGNOSIS — F4323 Adjustment disorder with mixed anxiety and depressed mood: Secondary | ICD-10-CM | POA: Insufficient documentation

## 2016-07-21 DIAGNOSIS — Z09 Encounter for follow-up examination after completed treatment for conditions other than malignant neoplasm: Secondary | ICD-10-CM

## 2016-07-21 DIAGNOSIS — Z79899 Other long term (current) drug therapy: Secondary | ICD-10-CM | POA: Insufficient documentation

## 2016-07-21 DIAGNOSIS — Z0000 Encounter for general adult medical examination without abnormal findings: Secondary | ICD-10-CM | POA: Insufficient documentation

## 2016-07-21 LAB — HIV-1 RNA QUANT-NO REFLEX-BLD
HIV 1 RNA Quant: 166 copies/mL — ABNORMAL HIGH
HIV-1 RNA Quant, Log: 2.22 Log copies/mL — ABNORMAL HIGH

## 2016-07-21 NOTE — Progress Notes (Signed)
Subjective:  Patient ID: Robert Stout, male    DOB: Jul 08, 1975  Age: 41 y.o. MRN: [id number]  CC: Annual Exam   HPI Zakk Borgen presents for annual physical examination. Pt.arrrived 15 minutes late to office appointment. Encouraged to rescheduled for physical exam. Pt.was asked if he had any other concerns that needed to be addressed in office. He denies any concerns. He denies any CP, SOB, or BLE. He reports following up with ID since previous office visit. He denies any constitutional symptoms. Based on PHQ9/GAD scores patient was asked if he would like to speak with clinical pharmacist. He declines speaking with LCSW at this time.    Outpatient Medications Prior to Visit  Medication Sig Dispense Refill  . bictegravir-emtricitabine-tenofovir AF (BIKTARVY) 50-200-25 MG TABS tablet Take 1 tablet by mouth daily. 30 tablet 6  . sulfamethoxazole-trimethoprim (BACTRIM) 400-80 MG tablet Take 1 tablet by mouth daily. 30 tablet 6   No facility-administered medications prior to visit.     ROS Review of Systems  Constitutional: Negative.   Respiratory: Negative.   Cardiovascular: Negative.   Psychiatric/Behavioral: Negative.    Objective:  BP 105/72 (BP Location: Left Arm, Patient Position: Sitting, Cuff Size: Normal)   Pulse 84   Temp 98.1 F (36.7 C) (Oral)   Resp 18   Ht 5\' 10"  (1.778 m)   Wt 239 lb 12.8 oz (108.8 kg)   SpO2 99%   BMI 34.41 kg/m   BP/Weight 07/21/2016 06/21/2016 06/07/2016  Systolic BP 105 91 126  Diastolic BP 72 62 79  Wt. (Lbs) 239.8 223 231  BMI 34.41 32 33.15    Physical Exam  Constitutional: He appears well-developed and well-nourished.  Neck: No JVD present.  Cardiovascular: Normal rate, regular rhythm, normal heart sounds and intact distal pulses.   Pulmonary/Chest: Effort normal and breath sounds normal.  Abdominal: Soft. Bowel sounds are normal.  Skin: Skin is warm and dry.  Psychiatric: His speech is normal. His affect is blunt. He is withdrawn.  He expresses no homicidal and no suicidal ideation. He expresses no suicidal plans and no homicidal plans. He is inattentive.  Nursing note and vitals reviewed.  Depression screen [facility] 2/9 07/21/2016 06/21/2016 06/07/2016  Decreased Interest 3 0 3  Down, Depressed, Hopeless 3 2 0  PHQ - 2 Score 6 2 3   Altered sleeping 2 0 0  Tired, decreased energy 1 0 0  Change in appetite 0 0 0  Feeling bad or failure about yourself  1 2 3   Trouble concentrating 0 0 0  Moving slowly or fidgety/restless 0 0 0  Suicidal thoughts 0 0 0  PHQ-9 Score 10 4 6     GAD 7 : Generalized Anxiety Score 07/21/2016 06/21/2016 06/07/2016  Nervous, Anxious, on Edge 3 0 0  Control/stop worrying 3 3 3   Worry too much - different things 3 3 3   Trouble relaxing 2 2 2   Restless 1 3 0  Easily annoyed or irritable 2 2 0  Afraid - awful might happen 3 3 0  Total GAD 7 Score 17 16 8      Assessment & Plan:   Problem List Items Addressed This Visit    None    Visit Diagnoses    Follow up    -  Primary   Adjustment disorder with mixed anxiety and depressed mood             -Patient declines speaking with LCSW.          -Provided with  depression and anxiety handout resources.        Follow-up: Return in about 2 weeks (around 08/04/2016) for Physical.   Lizbeth BarkMandesia R Hairston FNP

## 2016-07-21 NOTE — Patient Instructions (Signed)
Eating Healthy on a Budget There are many ways to save money at the grocery store and continue to eat healthy. You can be successful if you plan your meals according to your budget, purchase according to your budget and grocery list, and prepare food yourself. How can I buy more food on a limited budget? Plan   Plan meals and snacks according to a grocery list and budget you create.  Look for recipes where you can cook once and make enough food for two meals.  Include meals that will "stretch" more expensive foods such as stews, casseroles, and stir-fry dishes.  Make a grocery list and make sure to bring it with you to the store. If you have a smart phone, you could use your phone to create your shopping list. Purchase   When grocery shopping, buy only the items on your grocery list and go only to the areas of the store that have the items on your list. Prepare   Some meal items can be prepared in advance. Pre-cook on days when you have extra time.  Make extra food (such as by doubling recipes) and freeze the extras in meal-sized containers or in individual portions for fast meals and snacks.  Use leftovers in your meal plan for the week.  Try some meatless meals or try "no cook" meals like salads.  When you come home from the grocery store, wash and prepare your fruits and vegetables so they are ready to use and eat. This will help reduce food waste. How can I buy more food on a limited budget? Try these tips the next time you go shopping:  Buy store brands or generic brands.  Use coupons only for foods and brands you normally buy. Avoid buying items you wouldn't normally buy simply because they are on sale.  Check online and in newspapers for weekly deals.  Buy healthy items from the bulk bins when available, such as herbs, spices, flours, pastas, nuts, and dried fruit.  Buy fruits and vegetables that are in season. Prices are usually lower on in-season produce.  Compare and  contrast different items. You can do this by looking at the unit price on the price tag. Use it to compare different brands and sizes to find out which item is the best deal.  Choose naturally low-cost healthy items, such as carrots, potatoes, apples, bananas, and oranges. Dried or canned beans are a low-cost protein source.  Buy in bulk and freeze extra food. Items you can buy in bulk include meats, fish, poultry, frozen fruits, and frozen vegetables.  Limit the purchase of prepared or "ready-to-eat" foods, such as pre-cut fruits and vegetables and pre-made salads.  If possible, shop around to discover which grocery store offers the best prices. Some stores charge much more than other stores for the same items.  Do not shop when you are hungry. If you shop while hungry, It may be hard to stick to your list and budget.  Stick to your list and resist impulse buys. Treat your list as your official plan for the week.  Buy a variety of vegetables and fruit by purchasing fresh, frozen, and canned items.  Look beyond eye level. Foods at eye level (adult or child eye level) are more expensive. Look at the top and bottom shelves for deals.  Be efficient with your time when shopping. The more time you spend at the store, the more money you are likely to spend.  Consider other retailers such as dollar   stores, larger wholesale stores, local fruit and vegetable stands, and farmers markets. What are some tips for less expensive food substitutions? When choosing more expensive foods like meats and dairy, try these tips to save money:  Choose cheaper cuts of meat, such as bone-in chicken thighs and drumsticks instead skinless and boneless chicken. When you are ready to prepare the chicken, you can remove the skin yourself to make it healthier.  Choose lean meats like chicken or turkey. When choosing ground beef, make sure it is lean ground beef (92% lean, 8% fat). If you do buy a fattier ground beef,  drain the fat before eating.  Buy dried beans and peas, such as lentils, split peas, or kidney beans.  For seafood, choose canned tuna, salmon, or sardines.  Eggs are a low-cost source of protein.  Buy the larger tubs of yogurt instead of individual-sized containers.  Choose water instead of sodas and other sweetened beverages.  Skip buying chips, cookies, and other "junk food". These items are usually expensive, high in calories, and low in nutritional value. How can I prepare the foods I buy in the healthiest way? Practice these tips for cooking foods in the healthiest way to reduce excess fat and calorie intake:  Steam, saute, grill, or bake foods instead of frying them.  Make sure half your plate is filled with fruits or vegetables. Choose from fresh, frozen, or canned fruits and vegetables. If eating canned, remember to rinse them before eating. This will remove any excess salt added for packaging.  Trim all fat from meat before cooking. Remove the skin from chicken or turkey.  Spoon off fat from meat dishes once they have been chilled in the refrigerator and the fat has hardened on the top.  Use skim milk, low-fat milk, or evaporated skim milk when making cream sauces, soups, or puddings.  Substitute low-fat yogurt, sour cream, or cottage cheese for sour cream and mayonnaise in dips and dressings.  Try lemon juice, herbs, or spices to season food instead of salt, butter, or margarine. This information is not intended to replace advice given to you by your health care provider. Make sure you discuss any questions you have with your health care provider. Document Released: 10/12/2013 Document Revised: 08/29/2015 Document Reviewed: 09/11/2013 Elsevier Interactive Patient Education  2017 [employer]  

## 2016-07-21 NOTE — Progress Notes (Signed)
Patient is here for physical  Patient has eaten   Patient has not taking any medication for today  Patient denies pain for today

## 2016-07-28 ENCOUNTER — Encounter: Payer: Self-pay | Admitting: Infectious Disease

## 2016-07-28 ENCOUNTER — Ambulatory Visit (INDEPENDENT_AMBULATORY_CARE_PROVIDER_SITE_OTHER): Payer: Self-pay | Admitting: Infectious Disease

## 2016-07-28 VITALS — BP 113/76 | Temp 97.4°F | Ht 70.0 in | Wt 240.0 lb

## 2016-07-28 DIAGNOSIS — B20 Human immunodeficiency virus [HIV] disease: Secondary | ICD-10-CM

## 2016-07-28 DIAGNOSIS — B59 Pneumocystosis: Secondary | ICD-10-CM

## 2016-07-28 DIAGNOSIS — Z5900 Homelessness unspecified: Secondary | ICD-10-CM

## 2016-07-28 DIAGNOSIS — B009 Herpesviral infection, unspecified: Secondary | ICD-10-CM

## 2016-07-28 HISTORY — DX: Herpesviral infection, unspecified: B00.9

## 2016-07-28 MED ORDER — SULFAMETHOXAZOLE-TRIMETHOPRIM 400-80 MG PO TABS: 1.0000 | ORAL_TABLET | Freq: Every day | ORAL | 3 refills | Status: DC

## 2016-07-28 MED ORDER — BICTEGRAVIR-EMTRICITAB-TENOFOV 50-200-25 MG PO TABS: 1.0000 | ORAL_TABLET | Freq: Every day | ORAL | 11 refills | Status: DC

## 2016-07-28 MED ORDER — VALACYCLOVIR HCL 1 G PO TABS
ORAL_TABLET | ORAL | 11 refills | Status: DC
Start: 2016-07-28 — End: 2017-05-30

## 2016-07-28 NOTE — Patient Instructions (Signed)
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

## 2016-07-28 NOTE — Progress Notes (Signed)
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   MRN: [id number]  HPI  Robert Stout is a 41 year old man who was previously diagnosed HIV and had been recently incarcerated released but not reengaged in care and ultimately was admitted to the hospital with was thought to be PCP pneumonia and AIDS. We placed him on BIKTARVY though obtaining the medication was very difficult to to problems with meds from [facility] being lost by the pulsatile service. He has fontanelle to get his medication and with repeat viral load is dropped his viral load from well over 300,000 or 116 copies. He is also nicely restored his T cells from a value of 70 to 330.  Lab Results  Component Value Date   HIV1RNAQUANT 166 (H) 07/15/2016   Lab Results  Component Value Date   CD4TABS 330 (L) 07/15/2016   CD4TABS 70 (L) 06/01/2016   CD4TABS 460 09/04/2014   He does continue to live in his car and suffered from homelessness. [facility].  He is complaining of a herpes outbreak and asked for medications for this which I am prescribing in the form of Valtrex.    Review of Systems  Constitutional: Negative for chills and fever.  HENT: Negative for congestion and sore throat.   Eyes: Negative for photophobia.  Respiratory: Negative for cough, shortness of breath and wheezing.   Cardiovascular: Negative for chest pain, palpitations and leg swelling.  Gastrointestinal: Negative for abdominal pain, blood in stool, constipation, diarrhea, nausea and vomiting.  Genitourinary: Negative for dysuria, flank pain and hematuria.  Musculoskeletal: Negative for back pain and myalgias.  Skin: Positive for rash.  Neurological: Negative for dizziness, weakness and headaches.  Hematological: Does not bruise/bleed easily.  Psychiatric/Behavioral: Negative for suicidal ideas.        Objective:   Physical Exam  Constitutional: He is oriented to person, place, and time. He appears well-developed and well-nourished. No distress.  HENT:  Head: Normocephalic and atraumatic.  Mouth/Throat: No oropharyngeal exudate.  Eyes: Conjunctivae and EOM are normal. No scleral icterus.  Neck: Normal range of motion. Neck supple.  Cardiovascular: Normal rate and regular rhythm.   Pulmonary/Chest: Effort normal. No respiratory distress. He has no wheezes.  Abdominal: He exhibits no distension.  Musculoskeletal: He exhibits no edema or tenderness.  Neurological: He is alert and oriented to person, place, and time. He exhibits normal muscle tone. Coordination normal.  Skin: Skin is warm and dry. No rash noted. He is not diaphoretic. No erythema. No pallor.  Psychiatric: He has a normal mood and affect. His behavior is normal. Judgment and thought content normal.          Assessment & Plan:   HIV and AIDS: Continue BIKTARVY and Bactrim for PCP prevention recheck labs in follow-up visit with pharmacy he can stop his Bactrim once he's had a persistent CD4 count above 2003 months.  Need for vaccinations he will have social vaccinations as next visit with pharmacy which will be Dr. 1 month since last visit  HSV outbreak of prescribed him Valtrex.  PCP pnumonia: He has completed a course of therapy.  Homelessness he [facility].  I spent greater than 25 minutes with the patient including greater than 50% of time in face to face counsel  of the patient is HIV AIDS and her trial regimen PCP pneumonia herpes simplex homelessness and in coordination of his care.

## 2016-08-13 ENCOUNTER — Encounter: Payer: Self-pay | Admitting: Family Medicine

## 2016-08-19 ENCOUNTER — Ambulatory Visit (INDEPENDENT_AMBULATORY_CARE_PROVIDER_SITE_OTHER): Payer: Self-pay | Admitting: Pharmacist

## 2016-08-19 DIAGNOSIS — Z23 Encounter for immunization: Secondary | ICD-10-CM

## 2016-08-19 DIAGNOSIS — B20 Human immunodeficiency virus [HIV] disease: Secondary | ICD-10-CM

## 2016-08-19 NOTE — Progress Notes (Signed)
HPI: Robert Stout is a 41 y.o. male who presents to the [facility] pharmacy clinic today for HIV follow-up.   Allergies: No Known Allergies  Past Medical History: Past Medical History:  Diagnosis Date  . Robert Stout 07/28/2016  . HIV Stout [facility](HCC) Dx 2011    Social History: Social History   Social History  . Marital status: Single    Spouse name: N/A  . Number of children: N/A  . Years of education: N/A   Social History Main Topics  . Smoking status: Never Smoker  . Smokeless tobacco: Never Used  . Alcohol use No  . Drug use: No  . Sexual activity: No   Other Topics Concern  . Not on file   Social History Narrative  . No narrative on file    Current Regimen: Biktarvy  Labs: HIV 1 RNA Quant (copies/mL)  Date Value  07/15/2016 166 (H)   CD4 T Cell Abs (/uL)  Date Value  07/15/2016 330 (L)  06/01/2016 70 (L)  09/04/2014 460   Hepatitis B Surface Ag (no units)  Date Value  06/01/2016 Negative    CrCl: CrCl cannot be calculated (Patient's most recent lab result is older than the maximum 21 days allowed.).  Lipids:    Component Value Date/Time   CHOL 206 (H) 07/15/2016 1505   TRIG 209 (H) 07/15/2016 1505   HDL 36 (L) 07/15/2016 1505   CHOLHDL 5.7 (H) 07/15/2016 1505   VLDL 42 (H) 07/15/2016 1505   LDLCALC 128 (H) 07/15/2016 1505    Assessment: Robert Stout is here today to pick up his HIV medications that are shipped from [facility]. He is doing well on the Biktarvy - no missed doses and no side effects.  I also gave him his 2nd Hep B shot. I made some follow-up appointments for him for his vaccines.   Plans: - Continue Biktarvy - 2nd Hep B shot today - Appt to renew [name] 7/3 at 3pm - 2nd Menveo appt 7/23 at 3:30pm - F/u with Dr. Daiva EvesVan Dam 9/6 at 3pm - 2nd Hep A and 3rd Hep B appt 11/26 at 3:30pm  Cassie L. Kuppelweiser, PharmD, CPP Infectious Diseases Clinical Pharmacist [facility] 08/19/2016, 4:08 PM

## 2016-08-24 ENCOUNTER — Ambulatory Visit: Payer: Self-pay

## 2016-08-31 ENCOUNTER — Encounter: Payer: Self-pay | Admitting: Infectious Disease

## 2016-09-12 ENCOUNTER — Emergency Department (HOSPITAL_COMMUNITY): Payer: Self-pay

## 2016-09-12 ENCOUNTER — Encounter (HOSPITAL_COMMUNITY): Payer: Self-pay | Admitting: Emergency Medicine

## 2016-09-12 ENCOUNTER — Emergency Department (HOSPITAL_COMMUNITY)
Admission: EM | Admit: 2016-09-12 | Discharge: 2016-09-12 | Disposition: A | Discharge status: Home / Self Care | Payer: Self-pay | Attending: Emergency Medicine | Admitting: Emergency Medicine

## 2016-09-12 DIAGNOSIS — B20 Human immunodeficiency virus [HIV] disease: Secondary | ICD-10-CM | POA: Insufficient documentation

## 2016-09-12 DIAGNOSIS — Z79899 Other long term (current) drug therapy: Secondary | ICD-10-CM | POA: Insufficient documentation

## 2016-09-12 DIAGNOSIS — M79674 Pain in right toe(s): Secondary | ICD-10-CM | POA: Insufficient documentation

## 2016-09-12 MED ORDER — NAPROXEN 500 MG PO TABS: 500.0000 mg | ORAL_TABLET | Freq: Two times a day (BID) | ORAL | 0 refills | Status: DC

## 2016-09-12 MED ORDER — CEPHALEXIN 500 MG PO CAPS: 500.0000 mg | ORAL_CAPSULE | Freq: Three times a day (TID) | ORAL | 0 refills | Status: DC

## 2016-09-12 NOTE — ED Provider Notes (Signed)
  Face-to-face evaluation   History: He complains of pain in right great toe present for 2 weeks, without trauma.  Physical exam: Right great toe with tenderness and swelling, at and distal to the IP joint.  Mild erythema associated.  Tenderness is exquisite.  Pain increased with flexion at the IP joint.  No MTP tenderness.  Differential diagnosis-gout versus subcutaneous infection, versus nonspecific arthralgia.  Medical screening examination/treatment/procedure(s) were conducted as a shared visit with non-physician practitioner(s) and myself.  I personally evaluated the patient during the encounter   Mancel BaleWentz, Gaylynn Seiple, MD 09/12/16 1753

## 2016-09-12 NOTE — ED Provider Notes (Signed)
[facility]-EMERGENCY DEPT Provider Note   CSN: [id number] Arrival date & time: 09/12/16  1048  By signing my name below, I, Robert Stout, attest that this documentation has been prepared under the direction and in the presence of [name], PA-C.  Electronically Signed: Rosario Stout, ED Scribe. 09/12/16. 11:54 AM.  History   Chief Complaint Chief Complaint  Patient presents with  . Toe Pain   The history is provided by the patient. No language interpreter was used.    HPI Comments: Robert Stout is a 41 y.o. male with a PMHx of HIV infection (CD4: 330 on 07/15/16), who presents to the Emergency Department complaining of persistent right great toe pain beginning approximately two weeks ago. No recent injury or trauma to the area. He notes associated swelling over the area. His pain is worse with ambulation. He has been soaking the area at home w/o relief of his symptoms. Pt does note that he previously had surgery involving the toe when he was a child following injury. He denies weakness, numbness, or any other associated symptoms.   Past Medical History:  Diagnosis Date  . 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 HIV infection [facility]) Dx 2011   Patient Active Problem List   Diagnosis Date Noted  . 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 Tachycardia   . 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 Sepsis (HCC) 06/01/2016  . Hypokalemia 06/01/2016  . Influenza-like illness   . AIDS (acquired immune deficiency syndrome) (HCC)   . FUO (fever of unknown origin)   . Lung nodule   . Homelessness   . HIV disease (HCC) 02/07/2014   Past Surgical History:  Procedure Laterality Date  . TENDON REPAIR      Home Medications    Prior to Admission medications   Medication Sig Start Date End Date Taking? Authorizing Provider  bictegravir-emtricitabine-tenofovir AF (BIKTARVY) 50-200-25 MG TABS tablet Take 1 tablet by mouth daily. 07/28/16    Randall Hiss, MD  sulfamethoxazole-trimethoprim (BACTRIM) 400-80 MG tablet Take 1 tablet by mouth daily. 07/28/16   Randall Hiss, MD  valACYclovir (VALTREX) 1000 MG tablet Take BID x 7 days then daily 07/28/16   Daiva Eves, Lisette Grinder, MD   Family History Family History  Problem Relation Age of Onset  . Diabetes Mother   . Hypertension Mother    Social History Social History  Substance Use Topics  . Smoking status: Never Smoker  . Smokeless tobacco: Never Used  . Alcohol use No   Allergies   Patient has no known allergies.  Review of Systems Review of Systems  Musculoskeletal: Positive for arthralgias, joint swelling and myalgias.  Neurological: Negative for weakness and numbness.   Physical Exam Updated Vital Signs BP 123/80 (BP Location: Right Arm)   Pulse 82   Temp 98.2 F (36.8 C) (Oral)   Resp 18   SpO2 96%   Physical Exam  Constitutional: He appears well-developed and well-nourished. No distress.  HENT:  Head: Normocephalic and atraumatic.  Eyes: Conjunctivae are normal.  Neck: Normal range of motion.  Cardiovascular: Normal rate.   Pulmonary/Chest: Effort normal.  Abdominal: He exhibits no distension.  Musculoskeletal: Normal range of motion.  Swelling noted to the right great toe. ttp over medial PIP joint. Large callus to the area, ttp. No redness, drainage, flucuance. Pain with ROM of PIP joint. No MTP joint tenderness. Cap refill <2 sec distally. Fungal infection to  the toenail with no tenderness to palpation over the nail or cuticles.   Neurological: He is alert.  Skin: No pallor.  Psychiatric: He has a normal mood and affect. His behavior is normal.  Nursing note and vitals reviewed.  ED Treatments / Results  DIAGNOSTIC STUDIES: Oxygen Saturation is 96% on RA, normal by my interpretation.   COORDINATION OF CARE: 11:54 AM-Discussed next steps with pt. Pt verbalized understanding and is agreeable with the plan.   Labs (all labs ordered  are listed, but only abnormal results are displayed) Labs Reviewed - No data to display  EKG  EKG Interpretation None      Radiology Dg Toe Great Right  Result Date: 09/12/2016 CLINICAL DATA:  Right great toe swelling/pain EXAM: RIGHT GREAT TOE COMPARISON:  None. FINDINGS: No fracture or dislocation is seen. Mild degenerative changes of the 1st MTP joint. Mild soft tissue swelling. IMPRESSION: No acute osseus abnormality is seen. Mild soft tissue swelling. Electronically Signed   By: Charline BillsSriyesh  Krishnan M.D.   On: 09/12/2016 11:32    Procedures Procedures   Medications Ordered in ED Medications - No data to display  Initial Impression / Assessment and Plan / ED Course  I have reviewed the triage vital signs and the nursing notes.  Pertinent labs & imaging results that were available during my care of the patient were reviewed by me and considered in my medical decision making (see chart for details).     Pt with right great toe pain. No injury. Differential includes PIP joint arthropathy, possibly gout, possible callus pain with secondary infection. Discussed with Dr. Effie ShyWentz who has seen pt as well. Will start on antibiotics. Home with ice, elevation, soaks, NSAIDs, follow up with pcp.   Vitals:   09/12/16 1106  BP: 123/80  Pulse: 82  Resp: 18  Temp: 98.2 F (36.8 C)  TempSrc: Oral  SpO2: 96%     Final Clinical Impressions(s) / ED Diagnoses   Final diagnoses:  Pain of right great toe   New Prescriptions New Prescriptions   CEPHALEXIN (KEFLEX) 500 MG CAPSULE    Take 1 capsule (500 mg total) by mouth 3 (three) times daily.   NAPROXEN (NAPROSYN) 500 MG TABLET    Take 1 tablet (500 mg total) by mouth 2 (two) times daily.   I personally performed the services described in this documentation, which was scribed in my presence. The recorded information has been reviewed and is accurate.     Jaynie CrumbleKirichenko, Arush Gatliff, PA-C 09/12/16 1251    Mancel BaleWentz, Elliott, MD 09/12/16  1753

## 2016-09-12 NOTE — ED Triage Notes (Signed)
Pt sts right great toe pain x 1 month with swelling and pain

## 2016-09-12 NOTE — Discharge Instructions (Signed)
Keep your toe elevated. Keflex as prescribed until all gone. Warm soaks. Naprosyn for pain. Follow up with family doctor.

## 2016-09-13 ENCOUNTER — Ambulatory Visit: Payer: Self-pay

## 2016-09-14 ENCOUNTER — Ambulatory Visit (INDEPENDENT_AMBULATORY_CARE_PROVIDER_SITE_OTHER): Payer: Self-pay | Admitting: Pharmacist Clinician (PhC)/ Clinical Pharmacy Specialist

## 2016-09-14 DIAGNOSIS — B20 Human immunodeficiency virus [HIV] disease: Secondary | ICD-10-CM

## 2016-09-14 DIAGNOSIS — Z23 Encounter for immunization: Secondary | ICD-10-CM

## 2016-09-14 NOTE — Patient Instructions (Addendum)
Continue Biktarvy 1 daily Follow up with Dr. Daiva EvesVan Dam on 9/6 Follow up with us on Nov 26 for the last hepatitis A and B vaccine

## 2016-09-14 NOTE — Progress Notes (Signed)
HPI: Robert Stout is a 41 y.o. male who is here to see pharmacy for his HIV follow up and meningitis vaccine.   Allergies: No Known Allergies  Vitals:    Past Medical History: Past Medical History:  Diagnosis Date  . 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 HIV infection [facility](HCC) Dx 2011    Social History: Social History   Social History  . Marital status: Single    Spouse name: N/A  . Number of children: N/A  . Years of education: N/A   Social History Main Topics  . Smoking status: Never Smoker  . Smokeless tobacco: Never Used  . Alcohol use No  . Drug use: No  . Sexual activity: No   Other Topics Concern  . Not on file   Social History Narrative  . No narrative on file    Previous Regimen: ATP, DTG/Descovy  Current Regimen: Biktarvy  Labs: HIV 1 RNA Quant (copies/mL)  Date Value  07/15/2016 166 (H)   CD4 T Cell Abs (/uL)  Date Value  07/15/2016 330 (L)  06/01/2016 70 (L)  09/04/2014 460   Hepatitis B Surface Ag (no units)  Date Value  06/01/2016 Negative    CrCl: CrCl cannot be calculated (Patient's most recent lab result is older than the maximum 21 days allowed.).  Lipids:    Component Value Date/Time   CHOL 206 (H) 07/15/2016 1505   TRIG 209 (H) 07/15/2016 1505   HDL 36 (L) 07/15/2016 1505   CHOLHDL 5.7 (H) 07/15/2016 1505   VLDL 42 (H) 07/15/2016 1505   LDLCALC 128 (H) 07/15/2016 1505    Assessment: Robert Stout is here for his HIV check up with pharmacy and for the second shot of Menveo. He missed an appt with us yesterday. He is doing very well on the current regimen without missing a dose. He VL and CD4 have responded nicely. We are planning to stop the Bactrim once his CD4 remains above 200 for 3 months. He is almost there. He will be due for his las hep A and B in Nov.   He recently presented to the ED with his large toe pain. They were r/o gout vs infection. He was placed on keflex and naproxen. He said it's not helping. We were  planning to repeat his HIV labs today but I'll add uric acid to it. If he dose indeed has gout, we could place him on allopurinol.   He [facility] sle[facility] [employer]. Pointed an apartment complex out to him that may be reasonable on [address]. He will check it out.   Recommendations:  Menveo #2 today Cont Biktarvy F/u with Dr. Zenaida NieceVan dam in Sept HIV labs and uric acid today F/u with pharmacy in Nov for the hep A and B vaccine  Ulyses SouthwardMinh Leeana Creer, PharmD, BCPS, AAHIVP, CPP Clinical Infectious Disease Pharmacist [facility] 09/14/2016, 3:50 PM

## 2016-09-15 LAB — URIC ACID: URIC ACID, SERUM: 7.9 mg/dL (ref 4.0–8.0)

## 2016-09-16 LAB — T-HELPER CELL (CD4) - (RCID CLINIC ONLY)
CD4 T CELL HELPER: 16 % — AB (ref 33–55)
CD4 T Cell Abs: 410 /uL (ref 400–2700)

## 2016-09-17 LAB — HIV-1 RNA QUANT-NO REFLEX-BLD
HIV 1 RNA Quant: 114 copies/mL — ABNORMAL HIGH
HIV-1 RNA QUANT, LOG: 2.06 {Log_copies}/mL — AB

## 2016-09-20 ENCOUNTER — Encounter: Payer: Self-pay | Admitting: Family Medicine

## 2016-09-20 ENCOUNTER — Ambulatory Visit: Payer: Self-pay | Attending: Family Medicine | Admitting: Family Medicine

## 2016-09-20 VITALS — BP 95/61 | HR 91 | Temp 98.3°F | Resp 18 | Ht 70.0 in | Wt 253.2 lb

## 2016-09-20 DIAGNOSIS — B20 Human immunodeficiency virus [HIV] disease: Secondary | ICD-10-CM | POA: Insufficient documentation

## 2016-09-20 DIAGNOSIS — Z79899 Other long term (current) drug therapy: Secondary | ICD-10-CM | POA: Insufficient documentation

## 2016-09-20 DIAGNOSIS — Z0000 Encounter for general adult medical examination without abnormal findings: Secondary | ICD-10-CM | POA: Insufficient documentation

## 2016-09-20 DIAGNOSIS — M79674 Pain in right toe(s): Secondary | ICD-10-CM | POA: Insufficient documentation

## 2016-09-20 MED ORDER — NAPROXEN 500 MG PO TABS: 500.0000 mg | ORAL_TABLET | Freq: Two times a day (BID) | ORAL | 0 refills | Status: DC

## 2016-09-20 NOTE — Patient Instructions (Signed)
Eating Healthy on a Budget There are many ways to save money at the grocery store and continue to eat healthy. You can be successful if you plan your meals according to your budget, purchase according to your budget and grocery list, and prepare food yourself. How can I buy more food on a limited budget? Plan  Plan meals and snacks according to a grocery list and budget you create.  Look for recipes where you can cook once and make enough food for two meals.  Include meals that will "stretch" more expensive foods such as stews, casseroles, and stir-fry dishes.  Make a grocery list and make sure to bring it with you to the store. If you have a smart phone, you could use your phone to create your shopping list. Purchase  When grocery shopping, buy only the items on your grocery list and go only to the areas of the store that have the items on your list. Prepare  Some meal items can be prepared in advance. Pre-cook on days when you have extra time.  Make extra food (such as by doubling recipes) and freeze the extras in meal-sized containers or in individual portions for fast meals and snacks.  Use leftovers in your meal plan for the week.  Try some meatless meals or try "no cook" meals like salads.  When you come home from the grocery store, wash and prepare your fruits and vegetables so they are ready to use and eat. This will help reduce food waste. How can I buy more food on a limited budget? Try these tips the next time you go shopping:  Buy store brands or generic brands.  Use coupons only for foods and brands you normally buy. Avoid buying items you wouldn't normally buy simply because they are on sale.  Check online and in newspapers for weekly deals.  Buy healthy items from the bulk bins when available, such as herbs, spices, flours, pastas, nuts, and dried fruit.  Buy fruits and vegetables that are in season. Prices are usually lower on in-season produce.  Compare and  contrast different items. You can do this by looking at the unit price on the price tag. Use it to compare different brands and sizes to find out which item is the best deal.  Choose naturally low-cost healthy items, such as carrots, potatoes, apples, bananas, and oranges. Dried or canned beans are a low-cost protein source.  Buy in bulk and freeze extra food. Items you can buy in bulk include meats, fish, poultry, frozen fruits, and frozen vegetables.  Limit the purchase of prepared or "ready-to-eat" foods, such as pre-cut fruits and vegetables and pre-made salads.  If possible, shop around to discover which grocery store offers the best prices. Some stores charge much more than other stores for the same items.  Do not shop when you are hungry. If you shop while hungry, It may be hard to stick to your list and budget.  Stick to your list and resist impulse buys. Treat your list as your official plan for the week.  Buy a variety of vegetables and fruit by purchasing fresh, frozen, and canned items.  Look beyond eye level. Foods at eye level (adult or child eye level) are more expensive. Look at the top and bottom shelves for deals.  Be efficient with your time when shopping. The more time you spend at the store, the more money you are likely to spend.  Consider other retailers such as dollar stores, larger wholesale   stores, local fruit and vegetable stands, and farmers markets.  What are some tips for less expensive food substitutions? When choosing more expensive foods like meats and dairy, try these tips to save money:  Choose cheaper cuts of meat, such as bone-in chicken thighs and drumsticks instead skinless and boneless chicken. When you are ready to prepare the chicken, you can remove the skin yourself to make it healthier.  Choose lean meats like chicken or turkey. When choosing ground beef, make sure it is lean ground beef (92% lean, 8% fat). If you do buy a fattier ground beef,  drain the fat before eating.  Buy dried beans and peas, such as lentils, split peas, or kidney beans.  For seafood, choose canned tuna, salmon, or sardines.  Eggs are a low-cost source of protein.  Buy the larger tubs of yogurt instead of individual-sized containers.  Choose water instead of sodas and other sweetened beverages.  Skip buying chips, cookies, and other "junk food". These items are usually expensive, high in calories, and low in nutritional value.  How can I prepare the foods I buy in the healthiest way? Practice these tips for cooking foods in the healthiest way to reduce excess fat and calorie intake:  Steam, saute, grill, or bake foods instead of frying them.  Make sure half your plate is filled with fruits or vegetables. Choose from fresh, frozen, or canned fruits and vegetables. If eating canned, remember to rinse them before eating. This will remove any excess salt added for packaging.  Trim all fat from meat before cooking. Remove the skin from chicken or turkey.  Spoon off fat from meat dishes once they have been chilled in the refrigerator and the fat has hardened on the top.  Use skim milk, low-fat milk, or evaporated skim milk when making cream sauces, soups, or puddings.  Substitute low-fat yogurt, sour cream, or cottage cheese for sour cream and mayonnaise in dips and dressings.  Try lemon juice, herbs, or spices to season food instead of salt, butter, or margarine.  This information is not intended to replace advice given to you by your health care provider. Make sure you discuss any questions you have with your health care provider. Document Released: 10/12/2013 Document Revised: 08/29/2015 Document Reviewed: 09/11/2013 Elsevier Interactive Patient Education  2018 Elsevier Inc.  

## 2016-09-20 NOTE — Progress Notes (Signed)
Patient is here for physical.

## 2016-09-20 NOTE — Progress Notes (Deleted)
   Subjective:  Patient ID: Robert Stout, male    DOB: Oct 22, 1975  Age: 41 y.o. MRN: [id number]  CC: Annual Exam   HPI   Robert Stout presents for a comprehensive physical exam. Patient denies {Symptoms; cardiac:12860}.  Cardiovascular risk factors: {cv risk factors:510}. He denies any {asthma symptoms:406::"dyspnea","non-productive cough","wheezing"}.  He denies {gerd sx:13195}.      HTN mother   DM mother   Cancer no   Current smoker no  Alcohol use no   2 months 2018   Toe pain for 1 week        Outpatient Medications Prior to Visit  Medication Sig Dispense Refill  . bictegravir-emtricitabine-tenofovir AF (BIKTARVY) 50-200-25 MG TABS tablet Take 1 tablet by mouth daily. 30 tablet 11  . cephALEXin (KEFLEX) 500 MG capsule Take 1 capsule (500 mg total) by mouth 3 (three) times daily. 21 capsule 0  . naproxen (NAPROSYN) 500 MG tablet Take 1 tablet (500 mg total) by mouth 2 (two) times daily. 30 tablet 0  . sulfamethoxazole-trimethoprim (BACTRIM) 400-80 MG tablet Take 1 tablet by mouth daily. 30 tablet 3  . valACYclovir (VALTREX) 1000 MG tablet Take BID x 7 days then daily 40 tablet 11   No facility-administered medications prior to visit.     ROS Review of Systems  Constitutional: Negative.  Negative for appetite change.  HENT: Negative.   Eyes: Negative.   Respiratory: Negative.   Cardiovascular: Negative.   Gastrointestinal: Negative.   Endocrine: Negative.   Genitourinary: Negative.   Musculoskeletal: Negative.   Skin: Negative.   Neurological: Negative.   Hematological: Negative.   Psychiatric/Behavioral: Negative.      Objective:  BP 95/61 (BP Location: Left Arm, Patient Position: Sitting, Cuff Size: Large)   Pulse 91   Temp 98.3 F (36.8 C) (Oral)   Resp 18   Ht 5\' 10"  (1.778 m)   Wt 253 lb 3.2 oz (114.9 kg)   SpO2 98%   BMI 36.33 kg/m   BP/Weight 09/20/2016 09/12/2016 07/28/2016  Systolic BP 95 139 113  Diastolic BP 61 77 76  Wt. (Lbs)  253.2 - 240  BMI 36.33 - 34.44     Physical Exam   Assessment & Plan:   Problem List Items Addressed This Visit    None      No orders of the defined types were placed in this encounter.   Follow-up: No Follow-up on file.   Lizbeth BarkMandesia R Dlisa Barnwell FNP

## 2016-09-21 LAB — CMP14+EGFR
ALK PHOS: 68 IU/L (ref 39–117)
ALT: 15 IU/L (ref 0–44)
AST: 23 IU/L (ref 0–40)
Albumin/Globulin Ratio: 1.2 (ref 1.2–2.2)
Albumin: 4.1 g/dL (ref 3.5–5.5)
BUN/Creatinine Ratio: 11 (ref 9–20)
BUN: 13 mg/dL (ref 6–24)
Bilirubin Total: 0.3 mg/dL (ref 0.0–1.2)
CO2: 23 mmol/L (ref 20–29)
CREATININE: 1.23 mg/dL (ref 0.76–1.27)
Calcium: 9.1 mg/dL (ref 8.7–10.2)
Chloride: 104 mmol/L (ref 96–106)
GFR calc Af Amer: 83 mL/min/{1.73_m2} (ref >59)
GFR calc non Af Amer: 71 mL/min/{1.73_m2} (ref >59)
Globulin, Total: 3.4 g/dL (ref 1.5–4.5)
Glucose: 80 mg/dL (ref 65–99)
Potassium: 4.1 mmol/L (ref 3.5–5.2)
SODIUM: 141 mmol/L (ref 134–144)
Total Protein: 7.5 g/dL (ref 6.0–8.5)

## 2016-09-21 LAB — HEMOGLOBIN A1C
Est. average glucose Bld gHb Est-mCnc: 123 mg/dL
Hgb A1c MFr Bld: 5.9 % — ABNORMAL HIGH (ref 4.8–5.6)

## 2016-09-21 LAB — TSH: TSH: 1.67 u[IU]/mL (ref 0.450–4.500)

## 2016-09-21 LAB — CBC WITH DIFFERENTIAL/PLATELET
BASOS: 1 %
Basophils Absolute: 0 10*3/uL (ref 0.0–0.2)
EOS (ABSOLUTE): 0.2 10*3/uL (ref 0.0–0.4)
Eos: 3 %
HEMOGLOBIN: 12.8 g/dL — AB (ref 13.0–17.7)
Hematocrit: 37.2 % — ABNORMAL LOW (ref 37.5–51.0)
IMMATURE GRANS (ABS): 0 10*3/uL (ref 0.0–0.1)
Immature Granulocytes: 0 %
LYMPHS ABS: 2.4 10*3/uL (ref 0.7–3.1)
LYMPHS: 43 %
MCH: 31 pg (ref 26.6–33.0)
MCHC: 34.4 g/dL (ref 31.5–35.7)
MCV: 90 fL (ref 79–97)
MONOCYTES: 9 %
Monocytes Absolute: 0.5 10*3/uL (ref 0.1–0.9)
NEUTROS ABS: 2.4 10*3/uL (ref 1.4–7.0)
Neutrophils: 44 %
Platelets: 303 10*3/uL (ref 150–379)
RBC: 4.13 x10E6/uL — ABNORMAL LOW (ref 4.14–5.80)
RDW: 14.8 % (ref 12.3–15.4)
WBC: 5.5 10*3/uL (ref 3.4–10.8)

## 2016-09-26 NOTE — Progress Notes (Signed)
Subjective:   Patient ID: Robert Stout, male    DOB: [birth year], 41 y.o.   MRN: [id number]  Chief Complaint  Patient presents with  . Annual Exam   HPI Robert Stout 41 y.o. male presents for a comprehensive physical exam. History of HIV disease. He is being followed by ID. Patient denies chest pain, chest pressure/discomfort, claudication, near-syncope, palpitations and syncope.  Cardiovascular risk factors: none. He denies any dyspnea, non-productive cough and wheezing.  He denies melena and hematochezia. Family history HTN/DM-mother. Denies any family history of cancer. He denies any smoking or alcohol use. He complains of arthralgia for which has been present for 1 weeks. Pain is located in right great toe, is described as aching, and is intermittent .  Associated symptoms include: decreased range of motion and tenderness.  The patient has tried NSAIDs for pain, with moderate relief.  Related to injury: No. Denies symptoms of all other pertinent systems.    Past Medical History:  Diagnosis Date  . 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 HIV infection ([place]) Dx 2011    Past Surgical History:  Procedure Laterality Date  . TENDON REPAIR      Family History  Problem Relation Age of Onset  . Diabetes Mother   . Hypertension Mother     Social History   Social History  . Marital status: Single    Spouse name: N/A  . Number of children: N/A  . Years of education: N/A   Occupational History  . Not on file.   Social History Main Topics  . Smoking status: Never Smoker  . Smokeless tobacco: Never Used  . Alcohol use No  . Drug use: No  . Sexual activity: No   Other Topics Concern  . Not on file   Social History Narrative  . No narrative on file    Outpatient Medications Prior to Visit  Medication Sig Dispense Refill  . bictegravir-emtricitabine-tenofovir AF (BIKTARVY) 50-200-25 MG TABS tablet Take 1 tablet by mouth daily. 30 tablet 11  . cephALEXin (KEFLEX) 500 MG  capsule Take 1 capsule (500 mg total) by mouth 3 (three) times daily. 21 capsule 0  . sulfamethoxazole-trimethoprim (BACTRIM) 400-80 MG tablet Take 1 tablet by mouth daily. 30 tablet 3  . valACYclovir (VALTREX) 1000 MG tablet Take BID x 7 days then daily 40 tablet 11  . naproxen (NAPROSYN) 500 MG tablet Take 1 tablet (500 mg total) by mouth 2 (two) times daily. 30 tablet 0   No facility-administered medications prior to visit.     No Known Allergies  Review of Systems  Constitutional: Negative.   HENT: Negative.   Eyes: Negative.   Respiratory: Negative.   Cardiovascular: Negative.   Gastrointestinal: Negative.   Genitourinary: Negative.   Musculoskeletal: Positive for joint pain.  Skin: Negative.   Neurological: Negative.   Endo/Heme/Allergies: Negative.   Psychiatric/Behavioral: Negative for suicidal ideas.  All other systems reviewed and are negative.      Objective:    Physical Exam  Constitutional: He is oriented to person, place, and time. He appears well-developed and well-nourished.  HENT:  Head: Normocephalic and atraumatic.  Right Ear: External ear normal.  Left Ear: External ear normal.  Nose: Nose normal.  Mouth/Throat: Oropharynx is clear and moist.  Eyes: Pupils are equal, round, and reactive to light. Conjunctivae and EOM are normal.  Neck: Normal range of motion. Neck supple.  Cardiovascular: Normal rate, regular rhythm, normal heart sounds and intact distal pulses.   Pulmonary/Chest: Effort  normal and breath sounds normal.  Abdominal: Soft. Bowel sounds are normal.  Musculoskeletal: Normal range of motion.  Right great toe, limited flexion at the MTP joint; tenderness with palpation.   Neurological: He is alert and oriented to person, place, and time. He has normal reflexes.  Skin: Skin is warm and dry.  Psychiatric: He has a normal mood and affect.  Nursing note and vitals reviewed.   BP 95/61 (BP Location: Left Arm, Patient Position: Sitting, Cuff  Size: Large)   Pulse 91   Temp 98.3 F (36.8 C) (Oral)   Resp 18   Ht '5\' 10"'$  (1.778 m)   Wt 253 lb 3.2 oz (114.9 kg)   SpO2 98%   BMI 36.33 kg/m  Wt Readings from Last 3 Encounters:  09/20/16 253 lb 3.2 oz (114.9 kg)  07/28/16 240 lb (108.9 kg)  07/21/16 239 lb 12.8 oz (108.8 kg)    Immunization History  Administered Date(s) Administered  . Hepatitis A, Adult 07/15/2016  . Hepatitis B, adult 07/15/2016, 08/19/2016  . Influenza,inj,Quad PF,36+ Mos 12/11/2015  . Meningococcal Mcv4o 07/15/2016, 09/14/2016  . Pneumococcal Polysaccharide-23 07/15/2016    Diabetic Foot Exam - Simple   No data filed      Lab Results  Component Value Date   TSH 1.670 09/20/2016   Lab Results  Component Value Date   WBC 5.5 09/20/2016   HGB 12.8 (L) 09/20/2016   HCT 37.2 (L) 09/20/2016   MCV 90 09/20/2016   PLT 303 09/20/2016   Lab Results  Component Value Date   NA 141 09/20/2016   K 4.1 09/20/2016   CO2 23 09/20/2016   GLUCOSE 80 09/20/2016   BUN 13 09/20/2016   CREATININE 1.23 09/20/2016   BILITOT 0.3 09/20/2016   ALKPHOS 68 09/20/2016   AST 23 09/20/2016   ALT 15 09/20/2016   PROT 7.5 09/20/2016   ALBUMIN 4.1 09/20/2016   CALCIUM 9.1 09/20/2016   ANIONGAP 9 06/04/2016   Lab Results  Component Value Date   CHOL 206 (H) 07/15/2016   Lab Results  Component Value Date   HDL 36 (L) 07/15/2016   Lab Results  Component Value Date   LDLCALC 128 (H) 07/15/2016   Lab Results  Component Value Date   TRIG 209 (H) 07/15/2016   Lab Results  Component Value Date   CHOLHDL 5.7 (H) 07/15/2016   Lab Results  Component Value Date   HGBA1C 5.9 (H) 09/20/2016       Assessment & Plan:   Problem List Items Addressed This Visit    None    Visit Diagnoses    Annual physical exam    -  Primary   Relevant Orders   TSH (Completed)   CBC with Differential (Completed)   Hemoglobin A1c (Completed)   CMP14+EGFR (Completed)   Great toe pain, right       Relevant Medications    naproxen (NAPROSYN) 500 MG tablet      I am having Mr. Swingle maintain his valACYclovir, bictegravir-emtricitabine-tenofovir AF, sulfamethoxazole-trimethoprim, cephALEXin, and naproxen.  Meds ordered this encounter  Medications  . DISCONTD: naproxen (NAPROSYN) 500 MG tablet    Sig: Take 1 tablet (500 mg total) by mouth 2 (two) times daily.    Dispense:  30 tablet    Refill:  0    Order Specific Question:   Supervising Provider    Answer:   Tresa Garter [id number]  . naproxen (NAPROSYN) 500 MG tablet    Sig: Take 1 tablet (  500 mg total) by mouth 2 (two) times daily.    Dispense:  30 tablet    Refill:  0   Return if symptoms worsen or fail to improve.   Fredia Beets, FNP

## 2016-09-30 ENCOUNTER — Encounter: Payer: Self-pay | Admitting: Infectious Disease

## 2016-09-30 ENCOUNTER — Telehealth: Payer: Self-pay

## 2016-09-30 NOTE — Telephone Encounter (Signed)
CMA call regarding lab results  Patient did not answer & no Vm set up to leave message

## 2016-09-30 NOTE — Telephone Encounter (Signed)
-----   Message from Lizbeth BarkMandesia R Hairston, FNP sent at 09/30/2016  4:53 PM EDT ----- Diabetes screening shows that you are prediabetic. Avoid or decrease your intake of white rice, white sugar, white bread, and starches. Increase physical activity. Recommend follow up in 3 months. Labs that evaluated your fluid and electrolyte balance are normal.  Labs that evaluates your blood cells show anemia has improved. Increase your dietary iron intake. Good sources of iron include dark green leafy vegetables, meats, beans, and iron fortified cereals. Kidney function normal Liver function normal Thyroid function normal

## 2016-10-03 ENCOUNTER — Encounter: Payer: Self-pay | Admitting: Family Medicine

## 2016-10-03 ENCOUNTER — Encounter: Payer: Self-pay | Admitting: Infectious Disease

## 2016-10-04 ENCOUNTER — Other Ambulatory Visit: Payer: Self-pay | Admitting: *Deleted

## 2016-10-04 ENCOUNTER — Telehealth: Payer: Self-pay | Admitting: Infectious Disease

## 2016-10-04 MED ORDER — IMIQUIMOD 5 % EX CREA: TOPICAL_CREAM | CUTANEOUS | 1 refills | Status: DC

## 2016-10-04 NOTE — Telephone Encounter (Signed)
I sent aldara cream in but to [facility]. Is he [name]? In [facility]

## 2016-10-04 NOTE — Telephone Encounter (Signed)
He is an [name] patient and I resent his medication to [facility].

## 2016-10-04 NOTE — Telephone Encounter (Signed)
Thanks so much Travis! 

## 2016-10-28 ENCOUNTER — Encounter: Payer: Self-pay | Admitting: Infectious Disease

## 2016-10-28 ENCOUNTER — Ambulatory Visit (INDEPENDENT_AMBULATORY_CARE_PROVIDER_SITE_OTHER): Payer: Self-pay | Admitting: Infectious Disease

## 2016-10-28 VITALS — BP 119/76 | HR 93 | Temp 97.5°F | Wt 258.0 lb

## 2016-10-28 DIAGNOSIS — Z23 Encounter for immunization: Secondary | ICD-10-CM

## 2016-10-28 DIAGNOSIS — Z79899 Other long term (current) drug therapy: Secondary | ICD-10-CM

## 2016-10-28 DIAGNOSIS — M109 Gout, unspecified: Secondary | ICD-10-CM

## 2016-10-28 DIAGNOSIS — B20 Human immunodeficiency virus [HIV] disease: Secondary | ICD-10-CM

## 2016-10-28 DIAGNOSIS — A630 Anogenital (venereal) warts: Secondary | ICD-10-CM

## 2016-10-28 DIAGNOSIS — Z113 Encounter for screening for infections with a predominantly sexual mode of transmission: Secondary | ICD-10-CM

## 2016-10-28 DIAGNOSIS — Z5900 Homelessness unspecified: Secondary | ICD-10-CM

## 2016-10-28 HISTORY — DX: Anogenital (venereal) warts: A63.0

## 2016-10-28 HISTORY — DX: Gout, unspecified: M10.9

## 2016-10-28 MED ORDER — ALLOPURINOL 100 MG PO TABS: 100.0000 mg | ORAL_TABLET | Freq: Every day | ORAL | 11 refills | Status: DC

## 2016-10-28 MED ORDER — BICTEGRAVIR-EMTRICITAB-TENOFOV 50-200-25 MG PO TABS: 1.0000 | ORAL_TABLET | Freq: Every day | ORAL | 11 refills | Status: DC

## 2016-10-28 MED ORDER — PREDNISONE 20 MG PO TABS: 40.0000 mg | ORAL_TABLET | Freq: Every day | ORAL | 3 refills | Status: AC

## 2016-10-28 MED ORDER — PREDNISONE 1 MG PO TABS: 40.0000 mg | ORAL_TABLET | Freq: Every day | ORAL | Status: DC

## 2016-10-28 MED ORDER — COLCHICINE 0.6 MG PO TABS: ORAL_TABLET | ORAL | 1 refills | Status: DC

## 2016-10-28 MED ORDER — IMIQUIMOD 5 % EX CREA: TOPICAL_CREAM | CUTANEOUS | 4 refills | Status: DC

## 2016-10-28 NOTE — Progress Notes (Signed)
Subjective:   Chief complaints: gout flare in right toe and genital warts  Patient ID: Robert Stout, male    DOB: 13-Jan-1976, 41 y.o.   MRN: [id number]  HPI  Robert Stout is a 41 year old man who was previously diagnosed HIV and had been recently incarcerated released but not reengaged in care and ultimately was admitted to the hospital with was thought to be PCP pneumonia and AIDS. We placed him on BIKTARVY though obtaining the medication was very difficult to to [facility] by the pulsatile service. He has fontanelle to get his medication and with repeat viral load is dropped his viral load from well over 300,000 or 116 copies. He is also nicely restored his T cells from a value of 70 to 330 and now into 400s.     Lab Results  Component Value Date   HIV1RNAQUANT 114 (H) 09/14/2016   HIV1RNAQUANT 166 (H) 07/15/2016   Lab Results  Component Value Date   CD4TABS 410 09/14/2016   CD4TABS 330 (L) 07/15/2016   CD4TABS 70 (L) 06/01/2016  He continues to be homeless living in his car sleeping there are night stay [place] the [place] to the day if he can. He desires to work and has filled up a work for [employer].  [facility] and is trying to get him [facility].  He has had a flare of pain in his right MCP joint consistent with gout. His uric acid was normal that I think is present in his house consistent with gout. He is also still suffering from genital warts and has been prescribed L there are cream.  Past Medical History:  Diagnosis Date  . 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 HIV infection ([facility]) Dx 2011    Past Surgical History:  Procedure Laterality Date  . TENDON REPAIR      Family History  Problem Relation Age of Onset  . Diabetes Mother   . Hypertension Mother       Social History   Social History  . Marital status: Single    Spouse name: N/A  . Number of children: N/A  . Years of education: N/A   Social History Main Topics  . Smoking status:  Never Smoker  . Smokeless tobacco: Never Used  . Alcohol use No  . Drug use: No  . Sexual activity: No   Other Topics Concern  . None   Social History Narrative  . None    No Known Allergies   Current Outpatient Prescriptions:  .  bictegravir-emtricitabine-tenofovir AF (BIKTARVY) 50-200-25 MG TABS tablet, Take 1 tablet by mouth daily., Disp: 30 tablet, Rfl: 11 .  [START ON 10/29/2016] imiquimod (ALDARA) 5 % cream, Apply topically 3 (three) times a week., Disp: 12 each, Rfl: 4 .  naproxen (NAPROSYN) 500 MG tablet, Take 1 tablet (500 mg total) by mouth 2 (two) times daily., Disp: 30 tablet, Rfl: 0 .  valACYclovir (VALTREX) 1000 MG tablet, Take BID x 7 days then daily, Disp: 40 tablet, Rfl: 11 .  allopurinol (ZYLOPRIM) 100 MG tablet, Take 1 tablet (100 mg total) by mouth daily., Disp: 30 tablet, Rfl: 11 .  colchicine 0.6 MG tablet, Take 2 tablets x1 then one tablet 12 hours later, Disp: 3 tablet, Rfl: 1 .  predniSONE (DELTASONE) 20 MG tablet, Take 2 tablets (40 mg total) by mouth daily with breakfast., Disp: 10 tablet, Rfl: 3    Review of Systems  Constitutional: Negative for chills and fever.  HENT:  Negative for congestion and sore throat.   Eyes: Negative for photophobia.  Respiratory: Negative for cough, shortness of breath and wheezing.   Cardiovascular: Negative for chest pain, palpitations and leg swelling.  Gastrointestinal: Negative for abdominal pain, blood in stool, constipation, diarrhea, nausea and vomiting.  Genitourinary: Negative for dysuria, flank pain and hematuria.  Musculoskeletal: Positive for arthralgias. Negative for back pain and myalgias.  Skin: Positive for rash.  Neurological: Negative for dizziness, weakness and headaches.  Hematological: Does not bruise/bleed easily.  Psychiatric/Behavioral: Negative for suicidal ideas.       Objective:   Physical Exam  Constitutional: He is oriented to person, place, and time. He appears well-developed and  well-nourished. No distress.  HENT:  Head: Normocephalic and atraumatic.  Mouth/Throat: No oropharyngeal exudate.  Eyes: Conjunctivae and EOM are normal. No scleral icterus.  Neck: Normal range of motion. Neck supple.  Cardiovascular: Normal rate and regular rhythm.   Pulmonary/Chest: Effort normal. No respiratory distress. He has no wheezes.  Abdominal: He exhibits no distension.  Genitourinary:     Musculoskeletal: He exhibits no edema or tenderness.       Feet:  Neurological: He is alert and oriented to person, place, and time. He exhibits normal muscle tone. Coordination normal.  Skin: Skin is warm and dry. No rash noted. He is not diaphoretic. No erythema. No pallor.  Psychiatric: He has a normal mood and affect. His behavior is normal. Judgment and thought content normal.          Assessment & Plan:   HIV and AIDS: Continue BIKTARVY And check labs again today discontinue his Bactrim  Need for vaccinations given flu shot today  HSV outbreak of prescribed him Valtrex.  Homelessness he is working with the central  [employer].  Genital warts renew Aldara  Gout attack in right MCP joint: [facility]one [facility] [place] which should be free also absent perception for culture seen for a few days to [facility] as well as allopurinol with a month supply were him by that for roughly $16.  I spent greater than40  minutes with the patient including greater than 50% of time in face to face counsel of the patient is HIV AIDS homelessness, genital warts and gout and in coordination of his care.

## 2016-10-29 LAB — CBC WITH DIFFERENTIAL/PLATELET
BASOS PCT: 1.1 %
Basophils Absolute: 58 cells/uL (ref 0–200)
EOS PCT: 2.9 %
Eosinophils Absolute: 154 cells/uL (ref 15–500)
HCT: 40.8 % (ref 38.5–50.0)
HEMOGLOBIN: 14 g/dL (ref 13.2–17.1)
Lymphs Abs: 2518 cells/uL (ref 850–3900)
MCH: 30.8 pg (ref 27.0–33.0)
MCHC: 34.3 g/dL (ref 32.0–36.0)
MCV: 89.9 fL (ref 80.0–100.0)
MONOS PCT: 10.8 %
MPV: 9.6 fL (ref 7.5–12.5)
Neutro Abs: 1998 cells/uL (ref 1500–7800)
Neutrophils Relative %: 37.7 %
Platelets: 299 10*3/uL (ref 140–400)
RBC: 4.54 10*6/uL (ref 4.20–5.80)
RDW: 13.6 % (ref 11.0–15.0)
Total Lymphocyte: 47.5 %
WBC mixed population: 572 cells/uL (ref 200–950)
WBC: 5.3 10*3/uL (ref 3.8–10.8)

## 2016-10-29 LAB — URINE CYTOLOGY ANCILLARY ONLY
Chlamydia: NEGATIVE
NEISSERIA GONORRHEA: NEGATIVE

## 2016-10-29 LAB — COMPLETE METABOLIC PANEL WITH GFR
AG Ratio: 1.3 (calc) (ref 1.0–2.5)
ALT: 22 U/L (ref 9–46)
AST: 26 U/L (ref 10–40)
Albumin: 4.4 g/dL (ref 3.6–5.1)
Alkaline phosphatase (APISO): 60 U/L (ref 40–115)
BUN / CREAT RATIO: 11 (calc) (ref 6–22)
BUN: 17 mg/dL (ref 7–25)
CO2: 25 mmol/L (ref 20–32)
CREATININE: 1.56 mg/dL — AB (ref 0.60–1.35)
Calcium: 9.4 mg/dL (ref 8.6–10.3)
Chloride: 101 mmol/L (ref 98–110)
GFR, EST AFRICAN AMERICAN: 62 mL/min/{1.73_m2} (ref >=60)
GFR, Est Non African American: 54 mL/min/{1.73_m2} — ABNORMAL LOW (ref >=60)
GLUCOSE: 94 mg/dL (ref 65–99)
Globulin: 3.3 g/dL (calc) (ref 1.9–3.7)
Potassium: 4.1 mmol/L (ref 3.5–5.3)
Sodium: 134 mmol/L — ABNORMAL LOW (ref 135–146)
TOTAL PROTEIN: 7.7 g/dL (ref 6.1–8.1)
Total Bilirubin: 0.4 mg/dL (ref 0.2–1.2)

## 2016-10-29 LAB — RPR: RPR Ser Ql: NONREACTIVE

## 2016-10-29 LAB — T-HELPER CELL (CD4) - (RCID CLINIC ONLY)
CD4 T CELL HELPER: 14 % — AB (ref 33–55)
CD4 T Cell Abs: 400 /uL (ref 400–2700)

## 2016-11-01 LAB — HIV RNA, RTPCR W/R GT (RTI, PI,INT)
HIV 1 RNA QUANT: 33 {copies}/mL — AB
HIV-1 RNA Quant, Log: 1.52 Log copies/mL — ABNORMAL HIGH

## 2016-12-05 ENCOUNTER — Other Ambulatory Visit: Payer: Self-pay | Admitting: Infectious Disease

## 2016-12-07 ENCOUNTER — Other Ambulatory Visit: Payer: Self-pay | Admitting: Infectious Disease

## 2016-12-13 NOTE — Telephone Encounter (Signed)
Patient's gout has improved.  Patient does not need a refill for prednisone at this time.  Therapy completed.

## 2017-01-03 ENCOUNTER — Other Ambulatory Visit: Payer: Self-pay | Admitting: Family Medicine

## 2017-01-03 ENCOUNTER — Encounter: Payer: Self-pay | Admitting: Infectious Disease

## 2017-01-03 ENCOUNTER — Ambulatory Visit (INDEPENDENT_AMBULATORY_CARE_PROVIDER_SITE_OTHER): Payer: Self-pay | Admitting: Infectious Disease

## 2017-01-03 VITALS — BP 107/75 | HR 88 | Temp 98.0°F | Wt 272.0 lb

## 2017-01-03 DIAGNOSIS — B20 Human immunodeficiency virus [HIV] disease: Secondary | ICD-10-CM

## 2017-01-03 DIAGNOSIS — Z5900 Homelessness unspecified: Secondary | ICD-10-CM

## 2017-01-03 DIAGNOSIS — M109 Gout, unspecified: Secondary | ICD-10-CM

## 2017-01-03 DIAGNOSIS — K029 Dental caries, unspecified: Secondary | ICD-10-CM

## 2017-01-03 DIAGNOSIS — M79674 Pain in right toe(s): Secondary | ICD-10-CM

## 2017-01-03 DIAGNOSIS — B009 Herpesviral infection, unspecified: Secondary | ICD-10-CM

## 2017-01-03 HISTORY — DX: Dental caries, unspecified: K02.9

## 2017-01-03 NOTE — Progress Notes (Signed)
Subjective:   Chief complaints: gout flare in right toe and genital warts  Patient ID: Robert Stout, male    DOB: 1975/08/12, 41 y.o.   MRN: [id number]  HPI  Robert Stout is a 41 year old man who was previously diagnosed HIV and then  incarcerated released but not reengaged in care and ultimately was admitted to the hospital with was thought to be PCP pneumonia and AIDS. We placed him on BIKTARVY though obtaining the medication was very difficult to to [facility] the pulsatile service. He has responded to get his medication and with repeat viral load is dropped his viral load from well over 300,000 or 116 copies. He is also nicely restored his T cells from a value of 70 to 330 and now into 400s.  He actually has been able to obtain housing now which really is fantastic news.  He does still suffer from dental caries and has been waiting for several months to be seen by her ambulatory dental clinic.  He says that that woman who he called him Robert Stout as asked him to call has never returned his phone call.  He may have also missed one visit when he had to reschedule due to conflict with work.  We will try to get him back in the clinic as soon as possible.      Lab Results  Component Value Date   HIV1RNAQUANT 33 (H) 10/28/2016   HIV1RNAQUANT 114 (H) 09/14/2016   HIV1RNAQUANT 166 (H) 07/15/2016   Lab Results  Component Value Date   CD4TABS 400 10/28/2016   CD4TABS 410 09/14/2016   CD4TABS 330 (L) 07/15/2016    Past Medical History:  Diagnosis Date  . Genital warts 10/28/2016  . 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 HIV infection ([facility]) Dx 2011    Past Surgical History:  Procedure Laterality Date  . TENDON REPAIR      Family History  Problem Relation Age of Onset  . Diabetes Mother   . Hypertension Mother       Social History   Socioeconomic History  . Marital status: Single    Spouse name:  None  . Number of children: None  . Years of education: None  . Highest education level: None  Social Needs  . Financial resource strain: None  . Food insecurity - worry: None  . Food insecurity - inability: None  . Transportation needs - medical: None  . Transportation needs - non-medical: None  Occupational History  . None  Tobacco Use  . Smoking status: Never Smoker  . Smokeless tobacco: Never Used  Substance and Sexual Activity  . Alcohol use: No    Alcohol/week: 0.0 oz  . Drug use: No  . Sexual activity: No  Other Topics Concern  . None  Social History Narrative  . None    No Known Allergies   Current Outpatient Medications:  .  allopurinol (ZYLOPRIM) 100 MG tablet, Take 1 tablet (100 mg total) by mouth daily., Disp: 30 tablet, Rfl: 11 .  bictegravir-emtricitabine-tenofovir AF (BIKTARVY) 50-200-25 MG TABS tablet, Take 1 tablet by mouth daily., Disp: 30 tablet, Rfl: 11 .  colchicine 0.6 MG tablet, Take 2 tablets x1 then one tablet 12 hours later, Disp: 3 tablet, Rfl: 1 .  imiquimod (ALDARA) 5 % cream, Apply topically 3 (three) times a week., Disp: 12 each, Rfl: 4 .  valACYclovir (VALTREX) 1000 MG tablet, Take  BID x 7 days then daily, Disp: 40 tablet, Rfl: 11 .  naproxen (NAPROSYN) 500 MG tablet, Take 1 tablet (500 mg total) by mouth 2 (two) times daily. (Patient not taking: Reported on 01/03/2017), Disp: 30 tablet, Rfl: 0    Review of Systems  Constitutional: Negative for chills and fever.  HENT: Positive for dental problem. Negative for congestion and sore throat.   Eyes: Negative for photophobia.  Respiratory: Negative for cough, shortness of breath and wheezing.   Cardiovascular: Negative for chest pain, palpitations and leg swelling.  Gastrointestinal: Negative for abdominal pain, blood in stool, constipation, diarrhea, nausea and vomiting.  Genitourinary: Negative for dysuria, flank pain and hematuria.  Musculoskeletal: Negative for arthralgias, back pain and  myalgias.  Skin: Negative for rash.  Neurological: Negative for dizziness, weakness and headaches.  Hematological: Does not bruise/bleed easily.  Psychiatric/Behavioral: Negative for suicidal ideas.       Objective:   Physical Exam  Constitutional: He is oriented to person, place, and time. He appears well-developed and well-nourished. No distress.  HENT:  Head: Normocephalic and atraumatic.  Mouth/Throat: Dental caries present. No oropharyngeal exudate.  Eyes: Conjunctivae and EOM are normal. No scleral icterus.  Neck: Normal range of motion. Neck supple.  Cardiovascular: Normal rate and regular rhythm.  Pulmonary/Chest: Effort normal. No respiratory distress. He has no wheezes.  Abdominal: He exhibits no distension.  Musculoskeletal: He exhibits no edema or tenderness.  Neurological: He is alert and oriented to person, place, and time. He exhibits normal muscle tone. Coordination normal.  Skin: Skin is warm and dry. No rash noted. He is not diaphoretic. No erythema. No pallor.  Psychiatric: He has a normal mood and affect. His behavior is normal. Judgment and thought content normal.  Nursing note and vitals reviewed.         Assessment & Plan:   HIV and AIDS: Continue BIKTARVY And check labs again in February when he comes to renew [name]  HSV outbreak  On Valtrex.  Homelessness : he has housing now  Genital wartsAldara  Gout attack in right MCP joint: Given prednisone intermittently. Is he on allopurinol now as well  Caries: get him into dental clinic as this is worsening.

## 2017-01-10 ENCOUNTER — Encounter: Payer: Self-pay | Admitting: Infectious Disease

## 2017-01-11 ENCOUNTER — Encounter: Payer: Self-pay | Admitting: Infectious Diseases

## 2017-01-11 ENCOUNTER — Ambulatory Visit (INDEPENDENT_AMBULATORY_CARE_PROVIDER_SITE_OTHER): Payer: Self-pay | Admitting: Infectious Diseases

## 2017-01-11 VITALS — BP 133/86 | HR 91 | Temp 98.2°F | Wt 264.0 lb

## 2017-01-11 DIAGNOSIS — R059 Cough, unspecified: Secondary | ICD-10-CM

## 2017-01-11 DIAGNOSIS — B20 Human immunodeficiency virus [HIV] disease: Secondary | ICD-10-CM

## 2017-01-11 DIAGNOSIS — R05 Cough: Secondary | ICD-10-CM

## 2017-01-11 DIAGNOSIS — J209 Acute bronchitis, unspecified: Secondary | ICD-10-CM

## 2017-01-11 MED ORDER — PREDNISONE 20 MG PO TABS: 20.0000 mg | ORAL_TABLET | Freq: Two times a day (BID) | ORAL | 0 refills | Status: AC

## 2017-01-11 MED ORDER — ALBUTEROL SULFATE HFA 108 (90 BASE) MCG/ACT IN AERS: 2.0000 | INHALATION_SPRAY | Freq: Four times a day (QID) | RESPIRATORY_TRACT | 0 refills | Status: DC | PRN

## 2017-01-11 NOTE — Progress Notes (Signed)
Robert Shaggyeter Stout  1976-01-15 [id number] Robert BarkHairston, Mandesia R, FNP   Reason for Visit: sore throat, cough   Brief Narrative: Robert Stout is a 41 y.o. AA male with HIV infection that sees Dr. Daiva EvesVan Dam. He is currently on Biktarvy. He is here today for a history of prolonged cough, sore throat. Was recently in clinic for HIV follow up 01/03/17.     HPI:  Has had a 3 week history of cough, sore throat. He has tried brand name and off brand cough and cold medications that have not offered any improvement. Associated symptoms include nasal congestion, drainage down throat and mild shortness of breath with activity at work but otherwise no chest pain. Denies any facial tenderness, ear pain, dizziness, altered appetite, sputum. Feels cold on occasion but no chills, measured fevers or night sweats. Sleeping and eating normally. Overall he feels the course of this is improving a little but won't go away. He is not a smoker and does not have asthma or COPD at baseline.   His CD4 counts have been good and has not been on bactrim for OI proph recently.   Review of Systems: Review of Systems  Constitutional: Positive for malaise/fatigue. Negative for chills and fever.  HENT: Positive for congestion and sore throat. Negative for ear pain and sinus pain.   Respiratory: Positive for cough and shortness of breath. Negative for sputum production.   Cardiovascular: Negative for chest pain and orthopnea.  Gastrointestinal: Negative for diarrhea and nausea.  Genitourinary: Negative for dysuria.  Musculoskeletal: Negative for joint pain and myalgias.  Skin: Negative for rash.  Neurological: Negative for headaches.    Past Medical History:  Diagnosis Date  . Caries 01/03/2017  . Genital warts 10/28/2016  . 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 HIV infection (HCC) Dx 2011    No Known Allergies  Social History   Tobacco Use  . Smoking status: Never Smoker  .  Smokeless tobacco: Never Used  Substance Use Topics  . Alcohol use: No    Alcohol/week: 0.0 oz  . Drug use: No    Family History  Problem Relation Age of Onset  . Diabetes Mother   . Hypertension Mother     Social History   Substance and Sexual Activity  Sexual Activity No    Physical Exam and Objective Findings:  Vitals:   01/11/17 1128  BP: 133/86  Pulse: 91  Temp: 98.2 F (36.8 C)  TempSrc: Oral  Weight: 264 lb (119.7 kg)   Body mass index is 37.88 kg/m.  Physical Exam  Constitutional: He is oriented to person, place, and time and well-developed, well-nourished, and in no distress.  HENT:  Right Ear: Tympanic membrane and ear canal normal. No middle ear effusion.  Left Ear: Tympanic membrane and ear canal normal.  No middle ear effusion.  Nose: Mucosal edema present. No rhinorrhea. Right sinus exhibits no maxillary sinus tenderness and no frontal sinus tenderness. Left sinus exhibits no maxillary sinus tenderness and no frontal sinus tenderness.  Mouth/Throat: Mucous membranes are normal. No oral lesions. Normal dentition. No dental caries. Posterior oropharyngeal erythema present. No posterior oropharyngeal edema.  Tonsils are not enlarged   Eyes: Conjunctivae are normal. Pupils are equal, round, and reactive to light. Right eye exhibits no discharge. Left eye exhibits no discharge. No scleral icterus.  Neck: Normal range of motion.  Cardiovascular: Normal rate, regular rhythm and normal heart sounds.  Pulmonary/Chest: Effort normal. He  has no decreased breath sounds. He has wheezes in the right upper field and the left upper field.  Lymphadenopathy:    He has no cervical adenopathy.  Neurological: He is alert and oriented to person, place, and time.  Skin: Skin is warm and dry. No rash noted.  Psychiatric: Mood and affect normal.    Lab Results HIV 1 RNA Quant (copies/mL)  Date Value  10/28/2016 33 (H)  09/14/2016 114 (H)  07/15/2016 166 (H)   CD4 T Cell  Abs (/uL)  Date Value  10/28/2016 400  09/14/2016 410  07/15/2016 330 (L)      Problem List Items Addressed This Visit      Other   HIV disease (HCC)    Does have a hx of PJP. Last few CD4 have been in 400 range and viral load down to 33 copies recently.      Cough    Clear to auscultation aside from some upper airway expiratory wheezes. Likely he is on the tail end of viral bronchitis. Will try short course of prednisone to see if offers relief. Will give him rx for inhaler for symptomatic relief as well, however will not be covered by [name] and uncertain if he can afford it. I told him to try the prednisone first and see how he does.   Advised to increase fluids, rest and continue with supportive care with addition of benadryl at night to help with PND, saline rinses for nasal congestion with sparing decongestant use considering his BP. Claritin/Zyrtec during the day if he finds this to be helpful. Throat lozenges.   With his history I put in an order for [facility] should he feel worse or develop new symptoms including fever or sputum production.        Other Visit Diagnoses    Acute bronchitis, unspecified organism    -  Primary   Relevant Medications   albuterol (PROVENTIL HFA;VENTOLIN HFA) 108 (90 Base) MCG/ACT inhaler   predniSONE (DELTASONE) 20 MG tablet   Other Relevant Orders   DG Chest 2 View      Rexene AlbertsStephanie Labron Bloodgood, MSN, NP-C [facility] Pager: [phone]  01/12/2017  11:39 AM

## 2017-01-11 NOTE — Patient Instructions (Addendum)
Benadryl at night 1-2 tablets to help with some of the nasal drip   Would do zyrtec, claritin or allegra over the counter once a day. You can combine this with the sudafed (the behind the counter decongestant) or get the "Zyrtec-D" option.   Generic Aleve twice a day will help with some of the chest soreness and inflammation as well - take this with the decongestant to help.   Throat lozenges like Halls to help with sore throat and cough.   I have sent in a prescription for prednisone for you - take one pill twice a day with meals to see if this helps with your cough and inflammation.   I have put in a order for a chest xray for you - if you feel worse or start you can go to the hospital for a chest xray only without having to be seen in ED.   Rest as much as you can and increase fluid intake to think secretions.

## 2017-01-12 DIAGNOSIS — J069 Acute upper respiratory infection, unspecified: Secondary | ICD-10-CM | POA: Insufficient documentation

## 2017-01-12 DIAGNOSIS — B9789 Other viral agents as the cause of diseases classified elsewhere: Secondary | ICD-10-CM

## 2017-01-12 NOTE — Assessment & Plan Note (Addendum)
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 Last few CD4 have been in 400 range and viral load down to 33 copies recently.

## 2017-01-12 NOTE — Assessment & Plan Note (Signed)
Clear to auscultation aside from some upper airway expiratory wheezes. Likely he is on the tail end of viral bronchitis. Will try short course of prednisone to see if offers relief. Will give him rx for inhaler for symptomatic relief as well, however will not be covered by [name] and uncertain if he can afford it. I told him to try the prednisone first and see how he does.   Advised to increase fluids, rest and continue with supportive care with addition of benadryl at night to help with PND, saline rinses for nasal congestion with sparing decongestant use considering his BP. Claritin/Zyrtec during the day if he finds this to be helpful. Throat lozenges.   With his history I put in an order [facility] should he feel worse or develop new symptoms including fever or sputum production.

## 2017-01-17 ENCOUNTER — Ambulatory Visit (INDEPENDENT_AMBULATORY_CARE_PROVIDER_SITE_OTHER): Payer: Self-pay | Admitting: Pharmacist Clinician (PhC)/ Clinical Pharmacy Specialist

## 2017-01-17 DIAGNOSIS — Z23 Encounter for immunization: Secondary | ICD-10-CM

## 2017-01-17 DIAGNOSIS — B20 Human immunodeficiency virus [HIV] disease: Secondary | ICD-10-CM

## 2017-01-17 NOTE — Progress Notes (Signed)
HPI: Robert Stout is a 41 y.o. male who is here to get his last hep A/B shots.   Allergies: No Known Allergies  Vitals:    Past Medical History: Past Medical History:  Diagnosis Date  . Caries 01/03/2017  . Genital warts 10/28/2016  . 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 HIV infection [facility](HCC) Dx 2011    Social History: Social History   Socioeconomic History  . Marital status: Single    Spouse name: Not on file  . Number of children: Not on file  . Years of education: Not on file  . Highest education level: Not on file  Social Needs  . Financial resource strain: Not on file  . Food insecurity - worry: Not on file  . Food insecurity - inability: Not on file  . Transportation needs - medical: Not on file  . Transportation needs - non-medical: Not on file  Occupational History  . Not on file  Tobacco Use  . Smoking status: Never Smoker  . Smokeless tobacco: Never Used  Substance and Sexual Activity  . Alcohol use: No    Alcohol/week: 0.0 oz  . Drug use: No  . Sexual activity: No  Other Topics Concern  . Not on file  Social History Narrative  . Not on file    Previous Regimen: ATP, DTG/Descovy  Current Regimen: Biktarvy  Labs: HIV 1 RNA Quant (copies/mL)  Date Value  10/28/2016 33 (H)  09/14/2016 114 (H)  07/15/2016 166 (H)   CD4 T Cell Abs (/uL)  Date Value  10/28/2016 400  09/14/2016 410  07/15/2016 330 (L)   Hepatitis B Surface Ag (no units)  Date Value  06/01/2016 Negative    CrCl: CrCl cannot be calculated (Patient's most recent lab result is older than the maximum 21 days allowed.).  Lipids:    Component Value Date/Time   CHOL 206 (H) 07/15/2016 1505   TRIG 209 (H) 07/15/2016 1505   HDL 36 (L) 07/15/2016 1505   CHOLHDL 5.7 (H) 07/15/2016 1505   VLDL 42 (H) 07/15/2016 1505   LDLCALC 128 (H) 07/15/2016 1505    Assessment: Robert Stout is here to get his last shots of hep A/B. He has been  doing very well on Biktarvy. His VL is pretty much suppressed. He recently has some bronchitis issue where Judeth CornfieldStephanie send in some albuterol. However, the cost was ~$70 for the inhaler. Told him that he should try to reach out to me next time and I'll try to negotiate with [facility] to get it at a cheaper prices.  He still have some cough and congestions. Told him to go to [employer] to pick up some antitussive and allergy meds. I gave him some from our supply that I pick up the other day. He has an up coming appt with Dr. Daiva EvesVan Dam next year so we can do labs then.   Recommendations:  Cont Biktarvy 1 daily Final hep A/B today F/u with Dr. Daiva EvesVan Dam at the next visit  Ulyses SouthwardMinh Taveon Enyeart, PharmD, BCPS, AAHIVP, CPP Clinical Infectious Disease Pharmacist [facility] 01/17/2017, 4:53 PM

## 2017-01-17 NOTE — Patient Instructions (Signed)
Follow up with Dr. Daiva EvesVan Dam in Feb

## 2017-01-28 ENCOUNTER — Encounter: Payer: Self-pay | Admitting: Infectious Diseases

## 2017-01-28 ENCOUNTER — Encounter: Payer: Self-pay | Admitting: Infectious Disease

## 2017-01-29 ENCOUNTER — Encounter (HOSPITAL_COMMUNITY): Payer: Self-pay | Admitting: Emergency Medicine

## 2017-01-29 ENCOUNTER — Emergency Department (HOSPITAL_COMMUNITY)
Admission: EM | Admit: 2017-01-29 | Discharge: 2017-01-29 | Disposition: A | Discharge status: Home / Self Care | Payer: Self-pay | Attending: Emergency Medicine | Admitting: Emergency Medicine

## 2017-01-29 ENCOUNTER — Other Ambulatory Visit: Payer: Self-pay

## 2017-01-29 DIAGNOSIS — Z590 Homelessness: Secondary | ICD-10-CM | POA: Insufficient documentation

## 2017-01-29 DIAGNOSIS — Z202 Contact with and (suspected) exposure to infections with a predominantly sexual mode of transmission: Secondary | ICD-10-CM | POA: Insufficient documentation

## 2017-01-29 DIAGNOSIS — R369 Urethral discharge, unspecified: Secondary | ICD-10-CM | POA: Insufficient documentation

## 2017-01-29 DIAGNOSIS — Z21 Asymptomatic human immunodeficiency virus [HIV] infection status: Secondary | ICD-10-CM | POA: Insufficient documentation

## 2017-01-29 DIAGNOSIS — Z79899 Other long term (current) drug therapy: Secondary | ICD-10-CM | POA: Insufficient documentation

## 2017-01-29 MED ORDER — CEFTRIAXONE SODIUM 250 MG IJ SOLR
250.0000 mg | Freq: Once | INTRAMUSCULAR | Status: AC
  Administered 2017-01-29: 250 mg via INTRAMUSCULAR
  Filled 2017-01-29: qty 250

## 2017-01-29 MED ORDER — AZITHROMYCIN 1 G PO PACK
1.0000 g | PACK | Freq: Once | ORAL | Status: AC
  Administered 2017-01-29: 1 g via ORAL
  Filled 2017-01-29: qty 1

## 2017-01-29 MED ORDER — LIDOCAINE HCL (PF) 1 % IJ SOLN
INTRAMUSCULAR | Status: AC
Start: 2017-01-29 — End: 2017-01-29
  Administered 2017-01-29: 2 mL
  Filled 2017-01-29: qty 5

## 2017-01-29 MED ORDER — TETANUS-DIPHTH-ACELL PERTUSSIS 5-2.5-18.5 LF-MCG/0.5 IM SUSP: 0.5000 mL | Freq: Once | INTRAMUSCULAR | Status: DC

## 2017-01-29 NOTE — Discharge Instructions (Signed)
You were seen in the emergency department evaluated for sexually transmitted diseases.  We have evaluated you for gonorrhea, chlamydia, and syphilis.  You received treatment for gonorrhea and chlamydia while in the emergency department.   It is important that you do not have intercourse for the next 7 days.  Be sure to inform your partners as they will need treatment as well.  Continue to take your HIV medications as prescribed.  Follow-up with your primary care provider in 1 week.  Return to the emergency department for any new or worsening symptoms including but not limited to continued penile discharge, nausea, vomiting, abdominal pain, fevers.

## 2017-01-29 NOTE — ED Triage Notes (Addendum)
Pt states 2 days of cloudy penile discharge. Last intercourse was 2 months ago.  Hx of HIV, genital warts and herpes.

## 2017-01-29 NOTE — ED Provider Notes (Signed)
[facility] Provider Note   CSN: [id number] Arrival date & time: 01/29/17  [id number]     History   Chief Complaint Chief Complaint  Patient presents with  . Penile Discharge    HPI Robert Shaggyeter Leinbach is a 41 y.o. male history of HIV, HSV 2, and genital warts presents with complaint of penile discharge over the past few days.  Patient states the discharge is a white color.  He is experiencing associated increased urinary frequency.  No alleviating or aggravating factors.  States progressively worsening.  Reports this feels similar to previous chlamydia.  States he last had intercourse 2 months prior, this was unprotected.  Denies fever, chills, abdominal pain, nausea, vomiting, dysuria, or flank pain.  Reports he is taking his HIV medications as prescribed  HPI  Past Medical History:  Diagnosis Date  . Caries 01/03/2017  . Genital warts 10/28/2016  . 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 HIV infection [facility](HCC) Dx 2011    Patient Active Problem List   Diagnosis Date Noted  . Cough 01/12/2017  . Caries 01/03/2017  . Genital warts 10/28/2016  . 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 Tachycardia   . 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 Sepsis (HCC) 06/01/2016  . Hypokalemia 06/01/2016  . Lung nodule   . Homelessness   . HIV disease (HCC) 02/07/2014    Past Surgical History:  Procedure Laterality Date  . TENDON REPAIR         Home Medications    Prior to Admission medications   Medication Sig Start Date End Date Taking? Authorizing Provider  albuterol (PROVENTIL HFA;VENTOLIN HFA) 108 (90 Base) MCG/ACT inhaler Inhale 2 puffs into the lungs every 6 (six) hours as needed for wheezing or shortness of breath. 01/11/17   Blanchard Kelchixon, Stephanie N, NP  allopurinol (ZYLOPRIM) 100 MG tablet Take 1 tablet (100 mg  total) by mouth daily. 10/28/16   Randall HissVan Dam, Cornelius N, MD  bictegravir-emtricitabine-tenofovir AF (BIKTARVY) 50-200-25 MG TABS tablet Take 1 tablet by mouth daily. 10/28/16   Randall HissVan Dam, Cornelius N, MD  colchicine 0.6 MG tablet Take 2 tablets x1 then one tablet 12 hours later 10/28/16   Daiva EvesVan Dam, Lisette Grinderornelius N, MD  imiquimod [facility](ALDARA) 5 % cream Apply topically 3 (three) times a week. 10/29/16   Randall HissVan Dam, Cornelius N, MD  naproxen (NAPROSYN) 500 MG tablet Take 1 tablet (500 mg total) by mouth 2 (two) times daily. 09/20/16   [name], Mandesia R, FNP  valACYclovir (VALTREX) 1000 MG tablet Take BID x 7 days then daily 07/28/16   Daiva EvesVan Dam, Lisette Grinderornelius N, MD    Family History Family History  Problem Relation Age of Onset  . Diabetes Mother   . Hypertension Mother     Social History Social History   Tobacco Use  . Smoking status: Never Smoker  . Smokeless tobacco: Never Used  Substance Use Topics  . Alcohol use: No    Alcohol/week: 0.0 oz  . Drug use: No     Allergies   Patient has no known allergies.   Review of Systems Review of Systems  Constitutional: Negative for chills and fever.  Respiratory: Negative for shortness of breath.   Cardiovascular: Negative for chest pain.  Gastrointestinal: Negative for abdominal pain, diarrhea, nausea and vomiting.  Genitourinary: Positive for discharge and frequency. Negative  for dysuria, flank pain, hematuria, penile pain, penile swelling, scrotal swelling and testicular pain.  Allergic/Immunologic: Positive for immunocompromised state (hx of HIV).     Physical Exam Updated Vital Signs BP 122/88 (BP Location: Right Arm)   Pulse (!) 102   Temp 97.6 F (36.4 C) (Oral)   Resp 16   Ht 5\' 10"  (1.778 m)   Wt 113.4 kg (250 lb)   SpO2 98%   BMI 35.87 kg/m   Physical Exam  Constitutional: He appears well-developed and well-nourished. No distress.  HENT:  Head: Normocephalic and atraumatic.  Eyes: Conjunctivae are normal. Right eye exhibits no  discharge. Left eye exhibits no discharge.  Abdominal: Soft. He exhibits no distension. There is no tenderness. There is no rebound and no guarding.  Genitourinary: Testes normal. Cremasteric reflex is present. Right testis shows no swelling and no tenderness. Left testis shows no swelling and no tenderness. Circumcised. No penile erythema or penile tenderness. Discharge (white cloudy at the urethral meatus) found.  Genitourinary Comments: EMT patrick present as chaperone   Neurological: He is alert.  Clear speech.   Psychiatric: He has a normal mood and affect. His behavior is normal. Thought content normal.  Nursing note and vitals reviewed.    ED Treatments / Results  Labs (all labs ordered are listed, but only abnormal results are displayed) Labs Reviewed  RPR  GC/CHLAMYDIA PROBE AMP ([place]) NOT AT [facility]    EKG  EKG Interpretation None      Radiology No results found.  Procedures Procedures (including critical care time)  Medications Ordered in ED Medications  cefTRIAXone (ROCEPHIN) injection 250 mg (250 mg Intramuscular Given 01/29/17 1043)  azithromycin (ZITHROMAX) powder 1 g (1 g Oral Given 01/29/17 1043)  lidocaine (PF) (XYLOCAINE) 1 % injection (2 mLs  Given 01/29/17 1043)    Initial Impression / Assessment and Plan / ED Course  I have reviewed the triage vital signs and the nursing notes.  Pertinent labs & imaging results that were available during my care of the patient were reviewed by me and considered in my medical decision making (see chart for details).  Patient presents with complaint of penile discharge and urinary frequency.  The patient is nontoxic-appearing with stable vital signs.  Pertinent history includes HIV, HSV 2, and genital warts. Patient is afebrile without abdominal tenderness, abdominal pain or painful bowel movements to indicate prostatitis.  No tenderness to palpation of the testes or epididymis to suggest orchitis or epididymitis.  STD  testing obtained including syphilis, gonorrhea and chlamydia. Discussed importance of using protection when sexually active. Discussed the need to abstain from sexual activity for next 1 week after abx today. Pt understands that they have GC/Chlamydia cultures and syphilis lab work pending and that they will need to inform all sexual partners if results return positive. Patient has been treated prophylactically with azithromycin and ceftriaxone. I discussed treatment plan, need for PCP follow-up, and return precautions with the patient. Provided opportunity for questions, patient confirmed understanding and is in agreement with plan.    Final Clinical Impressions(s) / ED Diagnoses   Final diagnoses:  Possible exposure to STD    ED Discharge Orders    None       Cherly Andersonetrucelli, Corderius Saraceni R, PA-C 01/29/17 1130    Charlynne PanderYao, David Hsienta, MD 01/30/17 [phone]

## 2017-01-30 LAB — RPR: RPR Ser Ql: NONREACTIVE

## 2017-02-01 LAB — GC/CHLAMYDIA PROBE AMP (~~LOC~~) NOT AT ARMC
CHLAMYDIA, DNA PROBE: NEGATIVE
NEISSERIA GONORRHEA: POSITIVE — AB

## 2017-02-09 ENCOUNTER — Encounter: Payer: Self-pay | Admitting: Infectious Disease

## 2017-03-28 ENCOUNTER — Ambulatory Visit: Payer: Self-pay

## 2017-03-28 ENCOUNTER — Other Ambulatory Visit: Payer: Self-pay

## 2017-03-28 DIAGNOSIS — Z113 Encounter for screening for infections with a predominantly sexual mode of transmission: Secondary | ICD-10-CM

## 2017-03-28 DIAGNOSIS — Z79899 Other long term (current) drug therapy: Secondary | ICD-10-CM

## 2017-03-28 DIAGNOSIS — B20 Human immunodeficiency virus [HIV] disease: Secondary | ICD-10-CM

## 2017-03-29 LAB — CBC WITH DIFFERENTIAL/PLATELET
Basophils Absolute: 40 cells/uL (ref 0–200)
Basophils Relative: 0.6 %
Eosinophils Absolute: 127 cells/uL (ref 15–500)
Eosinophils Relative: 1.9 %
HCT: 39.6 % (ref 38.5–50.0)
HEMOGLOBIN: 13.8 g/dL (ref 13.2–17.1)
Lymphs Abs: 2874 cells/uL (ref 850–3900)
MCH: 31.8 pg (ref 27.0–33.0)
MCHC: 34.8 g/dL (ref 32.0–36.0)
MCV: 91.2 fL (ref 80.0–100.0)
MPV: 9.8 fL (ref 7.5–12.5)
Monocytes Relative: 7.5 %
NEUTROS ABS: 3156 {cells}/uL (ref 1500–7800)
Neutrophils Relative %: 47.1 %
PLATELETS: 294 10*3/uL (ref 140–400)
RBC: 4.34 10*6/uL (ref 4.20–5.80)
RDW: 13 % (ref 11.0–15.0)
TOTAL LYMPHOCYTE: 42.9 %
WBC: 6.7 10*3/uL (ref 3.8–10.8)
WBCMIX: 503 {cells}/uL (ref 200–950)

## 2017-03-29 LAB — COMPLETE METABOLIC PANEL WITH GFR
AG Ratio: 1.2 (calc) (ref 1.0–2.5)
ALKALINE PHOSPHATASE (APISO): 53 U/L (ref 40–115)
ALT: 15 U/L (ref 9–46)
AST: 21 U/L (ref 10–40)
Albumin: 4.1 g/dL (ref 3.6–5.1)
BUN: 11 mg/dL (ref 7–25)
CO2: 24 mmol/L (ref 20–32)
CREATININE: 1.34 mg/dL (ref 0.60–1.35)
Calcium: 9.6 mg/dL (ref 8.6–10.3)
Chloride: 102 mmol/L (ref 98–110)
GFR, Est African American: 74 mL/min/{1.73_m2} (ref >=60)
GFR, Est Non African American: 64 mL/min/{1.73_m2} (ref >=60)
GLOBULIN: 3.4 g/dL (ref 1.9–3.7)
GLUCOSE: 113 mg/dL — AB (ref 65–99)
Potassium: 3.8 mmol/L (ref 3.5–5.3)
SODIUM: 137 mmol/L (ref 135–146)
Total Bilirubin: 0.3 mg/dL (ref 0.2–1.2)
Total Protein: 7.5 g/dL (ref 6.1–8.1)

## 2017-03-29 LAB — LIPID PANEL
CHOL/HDL RATIO: 4.8 (calc) (ref <5.0)
Cholesterol: 184 mg/dL (ref <200)
HDL: 38 mg/dL — ABNORMAL LOW (ref >40)
LDL CHOLESTEROL (CALC): 114 mg/dL — AB
NON-HDL CHOLESTEROL (CALC): 146 mg/dL — AB (ref <130)
Triglycerides: 207 mg/dL — ABNORMAL HIGH (ref <150)

## 2017-03-29 LAB — RPR: RPR Ser Ql: NONREACTIVE

## 2017-03-29 LAB — T-HELPER CELL (CD4) - (RCID CLINIC ONLY)
CD4 % Helper T Cell: 20 % — ABNORMAL LOW (ref 33–55)
CD4 T CELL ABS: 660 /uL (ref 400–2700)

## 2017-03-29 LAB — URINE CYTOLOGY ANCILLARY ONLY
CHLAMYDIA, DNA PROBE: NEGATIVE
NEISSERIA GONORRHEA: NEGATIVE

## 2017-03-30 ENCOUNTER — Other Ambulatory Visit: Payer: Self-pay | Admitting: Infectious Disease

## 2017-03-30 ENCOUNTER — Encounter: Payer: Self-pay | Admitting: Infectious Disease

## 2017-03-30 LAB — HIV-1 RNA QUANT-NO REFLEX-BLD
HIV 1 RNA Quant: 82 copies/mL — ABNORMAL HIGH
HIV-1 RNA QUANT, LOG: 1.91 {Log_copies}/mL — AB

## 2017-04-11 ENCOUNTER — Encounter: Payer: Self-pay | Admitting: Infectious Disease

## 2017-04-11 ENCOUNTER — Ambulatory Visit (INDEPENDENT_AMBULATORY_CARE_PROVIDER_SITE_OTHER): Payer: Self-pay | Admitting: Infectious Disease

## 2017-04-11 VITALS — BP 114/74 | HR 89 | Temp 98.1°F | Ht 70.0 in | Wt 273.0 lb

## 2017-04-11 DIAGNOSIS — J4520 Mild intermittent asthma, uncomplicated: Secondary | ICD-10-CM

## 2017-04-11 DIAGNOSIS — J209 Acute bronchitis, unspecified: Secondary | ICD-10-CM

## 2017-04-11 DIAGNOSIS — B20 Human immunodeficiency virus [HIV] disease: Secondary | ICD-10-CM

## 2017-04-11 MED ORDER — MOMETASONE FURO-FORMOTEROL FUM 100-5 MCG/ACT IN AERO: 2.0000 | INHALATION_SPRAY | Freq: Two times a day (BID) | RESPIRATORY_TRACT | 5 refills | Status: DC

## 2017-04-11 MED ORDER — ALBUTEROL SULFATE HFA 108 (90 BASE) MCG/ACT IN AERS: 2.0000 | INHALATION_SPRAY | Freq: Four times a day (QID) | RESPIRATORY_TRACT | 5 refills | Status: DC | PRN

## 2017-04-11 MED ORDER — FLUTICASONE-SALMETEROL 115-21 MCG/ACT IN AERO: 2.0000 | INHALATION_SPRAY | Freq: Two times a day (BID) | RESPIRATORY_TRACT | 12 refills | Status: DC

## 2017-04-11 MED ORDER — BICTEGRAVIR-EMTRICITAB-TENOFOV 50-200-25 MG PO TABS: 1.0000 | ORAL_TABLET | Freq: Every day | ORAL | 11 refills | Status: DC

## 2017-04-11 MED ORDER — SPACER/AERO CHAMBER MOUTHPIECE MISC: 5 refills | Status: DC

## 2017-04-11 MED FILL — VENTOLIN HFA 90 MCG INHALER: 108 (90 BAS | 30 days supply | Qty: 18 | Fill #0

## 2017-04-11 NOTE — Progress Notes (Signed)
Subjective:   Chief complaints: having asthma flares from time to time   Patient ID: Robert Stout, male    DOB: [birth year], 42 y.o.   MRN: [id number]  HPI  Robert Stout is a 42 year-old man who was previously diagnosed HIV and then  incarcerated released but not reengaged in care and ultimately was admitted to the hospital with was thought to be PCP pneumonia and AIDS. We placed him on BIKTARVY though obtaining the medication was very difficult to to problems with meds [facility] being lost by the pulsatile service. He has responded to get his medication and with repeat viral load is dropped his viral load from well over 300,000 or 116 copies. He is also nicely restored his T cells from a value of 70 to 330 and now into 400s.  He actually has been able to obtain housing now which really is fantastic news.    His virus is under reasonable control at present.    Lab Results  Component Value Date   HIV1RNAQUANT 82 (H) 03/28/2017   HIV1RNAQUANT 33 (H) 10/28/2016   HIV1RNAQUANT 114 (H) 09/14/2016   Lab Results  Component Value Date   CD4TABS 660 03/28/2017   CD4TABS 400 10/28/2016   CD4TABS 410 09/14/2016    He has been suffering from asthma and needing to use albuterol sometimes a few times a week but he is out now.      Past Medical History:  Diagnosis Date  . Caries 01/03/2017  . Genital warts 10/28/2016  . 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 HIV infection (HCC) Dx 2011    Past Surgical History:  Procedure Laterality Date  . TENDON REPAIR      Family History  Problem Relation Age of Onset  . Diabetes Mother   . Hypertension Mother       Social History   Socioeconomic History  . Marital status: Single    Spouse name: None  . Number of children: None  . Years of education: None  . Highest education level: None  Social Needs  . Financial resource strain: None  . Food insecurity - worry: None  . Food  insecurity - inability: None  . Transportation needs - medical: None  . Transportation needs - non-medical: None  Occupational History  . None  Tobacco Use  . Smoking status: Never Smoker  . Smokeless tobacco: Never Used  Substance and Sexual Activity  . Alcohol use: No    Alcohol/week: 0.0 oz  . Drug use: No  . Sexual activity: No  Other Topics Concern  . None  Social History Narrative  . None    No Known Allergies   Current Outpatient Medications:  .  allopurinol (ZYLOPRIM) 100 MG tablet, Take 1 tablet (100 mg total) by mouth daily., Disp: 30 tablet, Rfl: 11 .  bictegravir-emtricitabine-tenofovir AF (BIKTARVY) 50-200-25 MG TABS tablet, Take 1 tablet by mouth daily., Disp: 30 tablet, Rfl: 11 .  imiquimod (ALDARA) 5 % cream, APPLY TOPICALLY TO THE AFFECTED AREA THREE TIMES A WEEK, Disp: 1 each, Rfl: 2 .  valACYclovir (VALTREX) 1000 MG tablet, Take BID x 7 days then daily, Disp: 40 tablet, Rfl: 11 .  albuterol (PROVENTIL HFA;VENTOLIN HFA) 108 (90 Base) MCG/ACT inhaler, Inhale 2 puffs into the lungs every 6 (six) hours as needed for wheezing or shortness of breath., Disp: 1 Inhaler, Rfl: 0 .  colchicine 0.6 MG tablet, Take 2 tablets x1 then  one tablet 12 hours later (Patient not taking: Reported on 04/11/2017), Disp: 3 tablet, Rfl: 1 .  naproxen (NAPROSYN) 500 MG tablet, Take 1 tablet (500 mg total) by mouth 2 (two) times daily., Disp: 30 tablet, Rfl: 0    Review of Systems  Constitutional: Negative for chills and fever.  HENT: Positive for dental problem. Negative for congestion and sore throat.   Eyes: Negative for photophobia.  Respiratory: Positive for shortness of breath. Negative for cough and wheezing.   Cardiovascular: Negative for chest pain, palpitations and leg swelling.  Gastrointestinal: Negative for abdominal pain, blood in stool, constipation, diarrhea, nausea and vomiting.  Genitourinary: Negative for dysuria, flank pain and hematuria.  Musculoskeletal: Negative  for arthralgias, back pain and myalgias.  Skin: Negative for rash.  Neurological: Negative for dizziness, weakness and headaches.  Hematological: Does not bruise/bleed easily.  Psychiatric/Behavioral: Negative for suicidal ideas.       Objective:   Physical Exam  Constitutional: He is oriented to person, place, and time. He appears well-developed and well-nourished. No distress.  HENT:  Head: Normocephalic and atraumatic.  Mouth/Throat: Dental caries present. No oropharyngeal exudate.  Eyes: Conjunctivae and EOM are normal. No scleral icterus.  Neck: Normal range of motion. Neck supple.  Cardiovascular: Normal rate, regular rhythm and normal heart sounds. Exam reveals no gallop and no friction rub.  No murmur heard. Pulmonary/Chest: Effort normal and breath sounds normal. No respiratory distress. He has no wheezes. He has no rales.  Abdominal: Soft. He exhibits no distension.  Musculoskeletal: He exhibits no edema or tenderness.  Neurological: He is alert and oriented to person, place, and time. He exhibits normal muscle tone. Coordination normal.  Skin: Skin is warm and dry. No rash noted. He is not diaphoretic. No erythema. No pallor.  Psychiatric: He has a normal mood and affect. His behavior is normal. Judgment and thought content normal.  Nursing note and vitals reviewed.         Assessment & Plan:   HIV and AIDS: Continue BIKTARVY,. HMAP renewed and RTC in the summer  Homelessness : he has housing now  Genital wartsAldara  Asthma: filling out patient assistance for [facility] that I want him to take to get controll of this plus albuterol MDI for breakthrough flares. I am also sending a spacer. Would be nice to get formal PFTs done at some point but without insurance he cannot afford this  I spent greater than 25 minutes with the patient including greater than 50% of time in face to face counsel of the patient re his asthma, how to take long acting meds to control baseline  inflammation and albuterol PRN  and in coordination of with ID pharmacy and patient assistance navigator.

## 2017-04-11 NOTE — Progress Notes (Signed)
HPI: Robert Stout is a 42 y.o. male who is here for his HIV visit with Dr. Daiva EvesVan Dam  Allergies: No Known Allergies  Vitals: Temp: 98.1 F (36.7 C) (02/18 1105) Temp Source: Oral (02/18 1105) BP: 114/74 (02/18 1105) Pulse Rate: 89 (02/18 1105)  Past Medical History: Past Medical History:  Diagnosis Date  . Caries 01/03/2017  . Genital warts 10/28/2016  . 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 HIV infection [facility](HCC) Dx 2011    Social History: Social History   Socioeconomic History  . Marital status: Single    Spouse name: None  . Number of children: None  . Years of education: None  . Highest education level: None  Social Needs  . Financial resource strain: None  . Food insecurity - worry: None  . Food insecurity - inability: None  . Transportation needs - medical: None  . Transportation needs - non-medical: None  Occupational History  . None  Tobacco Use  . Smoking status: Never Smoker  . Smokeless tobacco: Never Used  Substance and Sexual Activity  . Alcohol use: No    Alcohol/week: 0.0 oz  . Drug use: No  . Sexual activity: No  Other Topics Concern  . None  Social History Narrative  . None    Previous Regimen: ATP, DTG/Descovy  Current Regimen: Biktarvy  Labs: HIV 1 RNA Quant (copies/mL)  Date Value  03/28/2017 82 (H)  10/28/2016 33 (H)  09/14/2016 114 (H)   CD4 T Cell Abs (/uL)  Date Value  03/28/2017 660  10/28/2016 400  09/14/2016 410   Hepatitis B Surface Ag (no units)  Date Value  06/01/2016 Negative    CrCl: Estimated Creatinine Clearance: 92.8 mL/min (by C-G formula based on SCr of 1.34 mg/dL).  Lipids:    Component Value Date/Time   CHOL 184 03/28/2017 1552   TRIG 207 (H) 03/28/2017 1552   HDL 38 (L) 03/28/2017 1552   CHOLHDL 4.8 03/28/2017 1552   VLDL 42 (H) 07/15/2016 1505   LDLCALC 128 (H) 07/15/2016 1505    Assessment: Robert Stout has been doing well on his Biktarvy. Dr.  Daiva EvesVan Dam asked me to see if we can help with getting some albuterol for a cheaper price. He can afford about $10. [facility] will get it to him to that price. He is also going to be on a long acting inhaler. Advair was sent in but this would be very expensive for him. However, Elwin SleightDulera is on [facility]. Morrie Sheldonshley will fill out his application today for it. Counseled him on how to use it.   Recommendations:  Continue Biktarvy 1 PO qday Albuterol as [facility] Dulera 100/5 2 puffs BID [facility]  Minh Pham, PharmD, BCPS, AAHIVP, CPP Clinical Infectious Disease Pharmacist [facility] 04/11/2017, 11:48 AM

## 2017-05-17 ENCOUNTER — Telehealth: Payer: Self-pay | Admitting: Behavioral Health

## 2017-05-17 MED FILL — VENTOLIN HFA 90 MCG INHALER: 108 (90 BAS | 30 days supply | Qty: 18 | Fill #1

## 2017-05-17 NOTE — Telephone Encounter (Signed)
Patient called stating he needs a Refill on his Albuterol Inhaler.  Writer informed him that [facility].  Writer informed Black & DeckerMinh Pham Pharmacist because patient states he was able to get it for 10 dollars previously.  Writer notified patient that Rogelia BogaMinh was made aware and working on this.  Angeline SlimAshley Hill RN

## 2017-05-18 ENCOUNTER — Telehealth: Payer: Self-pay | Admitting: Pharmacist Clinician (PhC)/ Clinical Pharmacy Specialist

## 2017-05-18 NOTE — Telephone Encounter (Signed)
Thanks Minh I really appreciate it 

## 2017-05-18 NOTE — Telephone Encounter (Signed)
[facility] that [facility] said that the cost for his albuterol inhaler is $80. Since it's not on [name], it's very expensive to get. Robert CanalesSteve is willing to continue to sell it to him for $10. He is still taking his dulera from [facility]. Once he runs out, he will need to come back here to meet with [name] to renew with [facility].

## 2017-05-30 ENCOUNTER — Other Ambulatory Visit: Payer: Self-pay

## 2017-05-30 DIAGNOSIS — A630 Anogenital (venereal) warts: Secondary | ICD-10-CM

## 2017-05-30 DIAGNOSIS — B20 Human immunodeficiency virus [HIV] disease: Secondary | ICD-10-CM

## 2017-05-30 MED ORDER — IMIQUIMOD 5 % EX CREA
TOPICAL_CREAM | CUTANEOUS | 2 refills | Status: DC
Start: 2017-05-30 — End: 2017-09-23

## 2017-05-30 MED ORDER — VALACYCLOVIR HCL 1 G PO TABS
ORAL_TABLET | ORAL | 11 refills | Status: DC
Start: 2017-05-30 — End: 2017-11-08

## 2017-05-30 MED ORDER — BICTEGRAVIR-EMTRICITAB-TENOFOV 50-200-25 MG PO TABS: 1.0000 | ORAL_TABLET | Freq: Every day | ORAL | 11 refills | Status: DC

## 2017-08-11 ENCOUNTER — Other Ambulatory Visit: Payer: Self-pay | Admitting: Infectious Disease

## 2017-08-11 DIAGNOSIS — B20 Human immunodeficiency virus [HIV] disease: Secondary | ICD-10-CM

## 2017-08-29 ENCOUNTER — Ambulatory Visit: Payer: Self-pay

## 2017-09-02 ENCOUNTER — Encounter: Payer: Self-pay | Admitting: Infectious Disease

## 2017-09-23 ENCOUNTER — Other Ambulatory Visit: Payer: Self-pay | Admitting: Infectious Disease

## 2017-09-23 DIAGNOSIS — A630 Anogenital (venereal) warts: Secondary | ICD-10-CM

## 2017-09-26 ENCOUNTER — Other Ambulatory Visit: Payer: Self-pay | Admitting: *Deleted

## 2017-09-26 ENCOUNTER — Other Ambulatory Visit: Payer: Self-pay

## 2017-09-26 MED ORDER — MOMETASONE FURO-FORMOTEROL FUM 100-5 MCG/ACT IN AERO: 2.0000 | INHALATION_SPRAY | Freq: Two times a day (BID) | RESPIRATORY_TRACT | 5 refills | Status: DC

## 2017-10-11 ENCOUNTER — Encounter: Payer: Self-pay | Admitting: Infectious Disease

## 2017-10-25 ENCOUNTER — Other Ambulatory Visit: Payer: Self-pay

## 2017-10-25 DIAGNOSIS — B20 Human immunodeficiency virus [HIV] disease: Secondary | ICD-10-CM

## 2017-10-25 DIAGNOSIS — J209 Acute bronchitis, unspecified: Secondary | ICD-10-CM

## 2017-10-26 LAB — T-HELPER CELL (CD4) - (RCID CLINIC ONLY)
CD4 T CELL HELPER: 23 % — AB (ref 33–55)
CD4 T Cell Abs: 700 /uL (ref 400–2700)

## 2017-10-27 LAB — CBC WITH DIFFERENTIAL/PLATELET
BASOS PCT: 0.8 %
Basophils Absolute: 47 cells/uL (ref 0–200)
EOS ABS: 130 {cells}/uL (ref 15–500)
Eosinophils Relative: 2.2 %
HCT: 41 % (ref 38.5–50.0)
HEMOGLOBIN: 14 g/dL (ref 13.2–17.1)
Lymphs Abs: 3103 cells/uL (ref 850–3900)
MCH: 31.6 pg (ref 27.0–33.0)
MCHC: 34.1 g/dL (ref 32.0–36.0)
MCV: 92.6 fL (ref 80.0–100.0)
MONOS PCT: 8.1 %
MPV: 9.9 fL (ref 7.5–12.5)
NEUTROS ABS: 2142 {cells}/uL (ref 1500–7800)
Neutrophils Relative %: 36.3 %
Platelets: 324 10*3/uL (ref 140–400)
RBC: 4.43 10*6/uL (ref 4.20–5.80)
RDW: 13 % (ref 11.0–15.0)
Total Lymphocyte: 52.6 %
WBC mixed population: 478 cells/uL (ref 200–950)
WBC: 5.9 10*3/uL (ref 3.8–10.8)

## 2017-10-27 LAB — COMPLETE METABOLIC PANEL WITH GFR
AG Ratio: 1.3 (calc) (ref 1.0–2.5)
ALT: 21 U/L (ref 9–46)
AST: 31 U/L (ref 10–40)
Albumin: 4.3 g/dL (ref 3.6–5.1)
Alkaline phosphatase (APISO): 68 U/L (ref 40–115)
BUN/Creatinine Ratio: 6 (calc) (ref 6–22)
BUN: 9 mg/dL (ref 7–25)
CALCIUM: 9.7 mg/dL (ref 8.6–10.3)
CO2: 27 mmol/L (ref 20–32)
Chloride: 102 mmol/L (ref 98–110)
Creat: 1.48 mg/dL — ABNORMAL HIGH (ref 0.60–1.35)
GFR, EST NON AFRICAN AMERICAN: 57 mL/min/{1.73_m2} — AB (ref >=60)
GFR, Est African American: 66 mL/min/{1.73_m2} (ref >=60)
Globulin: 3.4 g/dL (calc) (ref 1.9–3.7)
Glucose, Bld: 130 mg/dL — ABNORMAL HIGH (ref 65–99)
POTASSIUM: 3.9 mmol/L (ref 3.5–5.3)
Sodium: 137 mmol/L (ref 135–146)
Total Bilirubin: 0.4 mg/dL (ref 0.2–1.2)
Total Protein: 7.7 g/dL (ref 6.1–8.1)

## 2017-10-27 LAB — LIPID PANEL
CHOL/HDL RATIO: 5.5 (calc) — AB (ref <5.0)
Cholesterol: 199 mg/dL (ref <200)
HDL: 36 mg/dL — ABNORMAL LOW (ref >40)
LDL Cholesterol (Calc): 131 mg/dL (calc) — ABNORMAL HIGH
Non-HDL Cholesterol (Calc): 163 mg/dL (calc) — ABNORMAL HIGH (ref <130)
Triglycerides: 184 mg/dL — ABNORMAL HIGH (ref <150)

## 2017-10-27 LAB — HIV-1 RNA QUANT-NO REFLEX-BLD
HIV 1 RNA Quant: <20 copies/mL — AB
HIV-1 RNA Quant, Log: <1.3 Log copies/mL — AB

## 2017-10-27 LAB — RPR: RPR Ser Ql: NONREACTIVE

## 2017-11-08 ENCOUNTER — Encounter: Payer: Self-pay | Admitting: Family

## 2017-11-08 ENCOUNTER — Ambulatory Visit (INDEPENDENT_AMBULATORY_CARE_PROVIDER_SITE_OTHER): Payer: Self-pay | Admitting: Family

## 2017-11-08 VITALS — BP 137/83 | HR 86 | Temp 98.7°F | Wt 278.0 lb

## 2017-11-08 DIAGNOSIS — B009 Herpesviral infection, unspecified: Secondary | ICD-10-CM

## 2017-11-08 DIAGNOSIS — B20 Human immunodeficiency virus [HIV] disease: Secondary | ICD-10-CM

## 2017-11-08 DIAGNOSIS — Z23 Encounter for immunization: Secondary | ICD-10-CM

## 2017-11-08 MED ORDER — FAMCICLOVIR 250 MG PO TABS: 250.0000 mg | ORAL_TABLET | Freq: Two times a day (BID) | ORAL | 2 refills | Status: DC

## 2017-11-08 NOTE — Assessment & Plan Note (Signed)
Mr. is a has poorly controlled HSV infections with current medication regimen of valacyclovir.  Discontinue valacyclovir and start famciclovir.  Continue to monitor and follow-up if symptoms worsen or do not improve.

## 2017-11-08 NOTE — Assessment & Plan Note (Signed)
Mr. Robert Stout has well-controlled HIV disease with current regimen and no adverse side effects.  He is adherent to the medication regimen with no problems obtaining his medication.  His viral load remains suppressed with a CD4 count of 700.  He has no signs/symptoms of opportunistic infection through physical exam or history.  He has no signs/symptoms of opportunistic infection through history or physical exam.  We will get him reconnected with our out reach nurse.  Prevnar and influenza updated today.  Continue current dose of Biktarvy.  Plan for office follow-up in 6 months or sooner if needed with blood work 1 to 2 weeks prior to office visit.

## 2017-11-08 NOTE — Patient Instructions (Addendum)
Nice to meet you.  We will check your A1c with next blood work.  Continue to take your BergooBiktarvy as prescribed.   Plan for follow up office visit in 6 months or sooner if needed with blood work 1-2 weeks prior to your appointment.  STOP taking the Valacyclovir and START taking the Famciclovir. Continue to monitor for outbreaks.   We will have Ambre get in touch with you.

## 2017-11-08 NOTE — Progress Notes (Signed)
Subjective:    Patient ID: Robert Stout, male    DOB: [birth year], 42 y.o.   MRN: [id number]  Chief Complaint  Patient presents with  . HIV Positive/AIDS     HPI:  Robert Stout is a 42 y.o. male who presents today for routine follow-up of HIV disease.  Robert Stout was last seen in the office on 04/11/2017 with a viral load of 82 and a CD4 count of 660. He was continued on the Ramer at the time.  His most recent blood work completed on 10/25/2017 shows a CD4 count of 700 and a viral load that remains undetectable.  Cholesterol was slightly elevated with an LDL of 131 and an HDL of 36.  RPR was negative.  His creatinine did creep up slightly to 1.48.  He is due for Prevnar and influenza vaccinations.  Robert Stout has been taking his Biktarvy as prescribed with no adverse side effects or missed doses.  He continues to receive his medication through the [facility] program and has applied appropriately.  He is interested in getting in contact with the out reach nurse. Denies fevers, chills, night sweats, headaches, changes in vision, neck pain/stiffness, nausea, diarrhea, vomiting, lesions or rashes.   No Known Allergies    Outpatient Medications Prior to Visit  Medication Sig Dispense Refill  . albuterol (PROVENTIL HFA;VENTOLIN HFA) 108 (90 Base) MCG/ACT inhaler Inhale 2 puffs into the lungs every 6 (six) hours as needed for wheezing or shortness of breath. 1 Inhaler 5  . allopurinol (ZYLOPRIM) 100 MG tablet Take 1 tablet (100 mg total) by mouth daily. 30 tablet 11  . bictegravir-emtricitabine-tenofovir AF (BIKTARVY) 50-200-25 MG TABS tablet Take 1 tablet by mouth daily. 30 tablet 11  . imiquimod (ALDARA) 5 % cream APPLY TOPICALLY TO THE AFFECTED AREA 3 TIMES WEEKLY 12 each 1  . mometasone-formoterol (DULERA) 100-5 MCG/ACT AERO Inhale 2 puffs into the lungs 2 (two) times daily. 1 Inhaler 5  . naproxen (NAPROSYN) 500 MG tablet Take 1 tablet (500 mg total) by mouth 2 (two) times daily. 30 tablet 0    . Spacer/Aero Chamber Mouthpiece MISC Use the spacer to administer albuterol 1 each 5  . valACYclovir (VALTREX) 1000 MG tablet Take BID x 7 days then daily 40 tablet 11  . valACYclovir (VALTREX) 1000 MG tablet Take 1 tablet (1,000 mg total) by mouth daily. 1 TABLET DAILY 30 tablet 5   No facility-administered medications prior to visit.      Past Medical History:  Diagnosis Date  . Caries 01/03/2017  . Genital warts 10/28/2016  . 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 HIV infection (HCC) Dx 2011     Past Surgical History:  Procedure Laterality Date  . TENDON REPAIR         Review of Systems  Constitutional: Negative for appetite change, chills, fatigue, fever and unexpected weight change.  Eyes: Negative for visual disturbance.  Respiratory: Negative for cough, chest tightness, shortness of breath and wheezing.   Cardiovascular: Negative for chest pain and leg swelling.  Gastrointestinal: Negative for abdominal pain, constipation, diarrhea, nausea and vomiting.  Genitourinary: Negative for dysuria, flank pain, frequency, genital sores, hematuria and urgency.  Skin: Negative for rash.  Allergic/Immunologic: Negative for immunocompromised state.  Neurological: Negative for dizziness and headaches.      Objective:    BP 137/83   Pulse 86   Temp 98.7 F (37.1 C) (Oral)   Wt 278  lb (126.1 kg)   BMI 39.89 kg/m  Nursing note and vital signs reviewed.  Physical Exam  Constitutional: He is oriented to person, place, and time. He appears well-developed. No distress.  HENT:  Mouth/Throat: Oropharynx is clear and moist.  Eyes: Conjunctivae are normal.  Neck: Neck supple.  Cardiovascular: Normal rate, regular rhythm, normal heart sounds and intact distal pulses. Exam reveals no gallop and no friction rub.  No murmur heard. Pulmonary/Chest: Effort normal and breath sounds normal. No respiratory distress. He has no wheezes.  He has no rales. He exhibits no tenderness.  Abdominal: Soft. Bowel sounds are normal. There is no tenderness.  Lymphadenopathy:    He has no cervical adenopathy.  Neurological: He is alert and oriented to person, place, and time.  Skin: Skin is warm and dry. No rash noted.  Psychiatric: He has a normal mood and affect. His behavior is normal. Judgment and thought content normal.       Assessment & Plan:   Problem List Items Addressed This Visit      Other   HIV disease (HCC) - Primary    Robert Stout has well-controlled HIV disease with current regimen and no adverse side effects.  He is adherent to the medication regimen with no problems obtaining his medication.  His viral load remains suppressed with a CD4 count of 700.  He has no signs/symptoms of opportunistic infection through physical exam or history.  He has no signs/symptoms of opportunistic infection through history or physical exam.  We will get him reconnected with our out reach nurse.  Prevnar and influenza updated today.  Continue current dose of Biktarvy.  Plan for office follow-up in 6 months or sooner if needed with blood work 1 to 2 weeks prior to office visit.      Relevant Medications   famciclovir (FAMVIR) 250 MG tablet   Other Relevant Orders   T-helper cell (CD4)- ([facility] clinic only)   HIV-1 RNA quant-no reflex-bld   CBC   Comprehensive metabolic panel   Lipid panel   RPR   HgB A1c   Pneumococcal conjugate vaccine 13-valent IM (Completed)   Herpes simplex type 2 infection    Mr. is a has poorly controlled HSV infections with current medication regimen of valacyclovir.  Discontinue valacyclovir and start famciclovir.  Continue to monitor and follow-up if symptoms worsen or do not improve.      Relevant Medications   famciclovir (FAMVIR) 250 MG tablet    Other Visit Diagnoses    Need for vaccination with 13-polyvalent pneumococcal conjugate vaccine       Relevant Orders   Pneumococcal conjugate vaccine  13-valent IM (Completed)   Need for immunization against influenza       Relevant Orders   Flu Vaccine QUAD 36+ mos IM (Completed)       I have discontinued Theron AristaPeter Acero's valACYclovir and valACYclovir. I am also having him start on famciclovir. Additionally, I am having him maintain his naproxen, allopurinol, Spacer/Aero Chamber Mouthpiece, albuterol, bictegravir-emtricitabine-tenofovir AF, imiquimod, and mometasone-formoterol.   Meds ordered this encounter  Medications  . famciclovir (FAMVIR) 250 MG tablet    Sig: Take 1 tablet (250 mg total) by mouth 2 (two) times daily.    Dispense:  60 tablet    Refill:  2    Order Specific Question:   Supervising Provider    Answer:   Judyann MunsonSNIDER, CYNTHIA [[id number]]     Follow-up: Return in about 6 months (around 05/09/2018), or if symptoms worsen or  fail to improve.   Terri Piedra, MSN, FNP-C Nurse Practitioner [facility] Office phone: [phone] Pager: [place] number: [phone]

## 2017-11-17 ENCOUNTER — Telehealth: Payer: Self-pay | Admitting: Behavioral Health

## 2017-11-17 NOTE — Telephone Encounter (Signed)
Robert Stout called stating he did not get his Dulera inhaler in the mail.  Patient states he received it las month.  He states Alvan Dame. has helped him with medication assistance in the past and was told to call the office if he has issues.  Spoke with Alvan Dame and she states Robert. Chabot needs to come and apply again for medication assistance.  Robert Mcgroarty was made aware and states he will come in next week to fill out the correct paperwork. Angeline Slim RN

## 2017-11-18 ENCOUNTER — Other Ambulatory Visit: Payer: Self-pay | Admitting: Behavioral Health

## 2017-11-18 DIAGNOSIS — J4520 Mild intermittent asthma, uncomplicated: Secondary | ICD-10-CM

## 2017-11-18 MED ORDER — MOMETASONE FURO-FORMOTEROL FUM 100-5 MCG/ACT IN AERO: 2.0000 | INHALATION_SPRAY | Freq: Two times a day (BID) | RESPIRATORY_TRACT | 5 refills | Status: DC

## 2017-11-18 NOTE — Telephone Encounter (Signed)
Noted. Thanks.

## 2017-11-30 ENCOUNTER — Other Ambulatory Visit: Payer: Self-pay | Admitting: Infectious Disease

## 2017-11-30 DIAGNOSIS — A630 Anogenital (venereal) warts: Secondary | ICD-10-CM

## 2017-12-01 ENCOUNTER — Telehealth: Payer: Self-pay | Admitting: *Deleted

## 2017-12-01 DIAGNOSIS — A630 Anogenital (venereal) warts: Secondary | ICD-10-CM

## 2017-12-01 NOTE — Telephone Encounter (Signed)
Patient called for refill of his aldara cream. He also noted that [facility] refilled valtrex instead of new famciclovir.  RN left message with [facility] to cancel all valtrex refills on file, to please fill famciclovir today. Patient asked how long he should be on aldara cream? He states he hasn't seen much improvement in his warts. He thought Dr Daiva Eves stated it could take a year.  Please advise on genital wart treatment. Andree Coss, RN

## 2017-12-01 NOTE — Telephone Encounter (Signed)
Anchor study is a great idea. This is too long to be using Aldara

## 2017-12-05 NOTE — Addendum Note (Signed)
Addended by: Andree Coss on: 12/05/2017 05:30 PM   Modules accepted: Orders

## 2017-12-05 NOTE — Telephone Encounter (Signed)
Referral placed for Infectious Disease/Dr Maurice March, Anchor Study.

## 2017-12-26 ENCOUNTER — Other Ambulatory Visit: Payer: Self-pay | Admitting: Infectious Disease

## 2017-12-26 DIAGNOSIS — B20 Human immunodeficiency virus [HIV] disease: Secondary | ICD-10-CM

## 2018-01-22 ENCOUNTER — Other Ambulatory Visit: Payer: Self-pay | Admitting: Infectious Disease

## 2018-01-22 DIAGNOSIS — A630 Anogenital (venereal) warts: Secondary | ICD-10-CM

## 2018-02-06 ENCOUNTER — Ambulatory Visit (HOSPITAL_COMMUNITY)
Admission: RE | Admit: 2018-02-06 | Discharge: 2018-02-06 | Disposition: A | Discharge status: Home / Self Care | Payer: Self-pay | Source: Ambulatory Visit | Attending: Infectious Diseases | Admitting: Infectious Diseases

## 2018-02-06 ENCOUNTER — Ambulatory Visit: Payer: Self-pay | Attending: Nurse Practitioner | Admitting: Nurse Practitioner

## 2018-02-06 ENCOUNTER — Encounter: Payer: Self-pay | Admitting: Nurse Practitioner

## 2018-02-06 VITALS — BP 129/86 | HR 86 | Temp 98.7°F | Ht 70.0 in | Wt 286.2 lb

## 2018-02-06 DIAGNOSIS — R071 Chest pain on breathing: Secondary | ICD-10-CM | POA: Insufficient documentation

## 2018-02-06 DIAGNOSIS — M109 Gout, unspecified: Secondary | ICD-10-CM | POA: Insufficient documentation

## 2018-02-06 DIAGNOSIS — Z791 Long term (current) use of non-steroidal anti-inflammatories (NSAID): Secondary | ICD-10-CM | POA: Insufficient documentation

## 2018-02-06 DIAGNOSIS — R0789 Other chest pain: Secondary | ICD-10-CM

## 2018-02-06 DIAGNOSIS — A630 Anogenital (venereal) warts: Secondary | ICD-10-CM | POA: Insufficient documentation

## 2018-02-06 DIAGNOSIS — B20 Human immunodeficiency virus [HIV] disease: Secondary | ICD-10-CM | POA: Insufficient documentation

## 2018-02-06 DIAGNOSIS — Z79899 Other long term (current) drug therapy: Secondary | ICD-10-CM | POA: Insufficient documentation

## 2018-02-06 DIAGNOSIS — J209 Acute bronchitis, unspecified: Secondary | ICD-10-CM | POA: Insufficient documentation

## 2018-02-06 DIAGNOSIS — Z8249 Family history of ischemic heart disease and other diseases of the circulatory system: Secondary | ICD-10-CM | POA: Insufficient documentation

## 2018-02-06 MED ORDER — METHOCARBAMOL 500 MG PO TABS: 500.0000 mg | ORAL_TABLET | Freq: Four times a day (QID) | ORAL | 1 refills | Status: AC

## 2018-02-06 MED ORDER — DICLOFENAC SODIUM 1 % TD GEL: 4.0000 g | Freq: Four times a day (QID) | TRANSDERMAL | 0 refills | Status: AC

## 2018-02-06 MED FILL — METHOCARBAMOL 500 MG TABS: 500 | 15 days supply | Qty: 60 | Fill #0

## 2018-02-06 MED FILL — DICLOFENAC SODIUM 1% GEL: 1 | 6 days supply | Qty: 100 | Fill #0

## 2018-02-06 NOTE — Patient Instructions (Signed)
Costochondritis Costochondritis is swelling and irritation (inflammation) of the tissue (cartilage) that connects your ribs to your breastbone (sternum). This causes pain in the front of your chest. Usually, the pain:  Starts gradually.  Is in more than one rib.  This condition usually goes away on its own over time. Follow these instructions at home:  Do not do anything that makes your pain worse.  If directed, put ice on the painful area: ? Put ice in a plastic bag. ? Place a towel between your skin and the bag. ? Leave the ice on for 20 minutes, 2-3 times a day.  If directed, put heat on the affected area as often as told by your doctor. Use the heat source that your doctor tells you to use, such as a moist heat pack or a heating pad. ? Place a towel between your skin and the heat source. ? Leave the heat on for 20-30 minutes. ? Take off the heat if your skin turns bright red. This is very important if you cannot feel pain, heat, or cold. You may have a greater risk of getting burned.  Take over-the-counter and prescription medicines only as told by your doctor.  Return to your normal activities as told by your doctor. Ask your doctor what activities are safe for you.  Keep all follow-up visits as told by your doctor. This is important. Contact a doctor if:  You have chills or a fever.  Your pain does not go away or it gets worse.  You have a cough that does not go away. Get help right away if:  You are short of breath. This information is not intended to replace advice given to you by your health care provider. Make sure you discuss any questions you have with your health care provider. Document Released: 07/28/2007 Document Revised: 08/29/2015 Document Reviewed: 06/04/2015 Elsevier Interactive Patient Education  2018 [employer]  

## 2018-02-06 NOTE — Progress Notes (Signed)
Assessment & Plan:  Anchor was seen today for establish care.  Diagnoses and all orders for this visit:  Costochondral chest pain -     diclofenac sodium (VOLTAREN) 1 % GEL; Apply 4 g topically 4 (four) times daily for 14 days. -     methocarbamol (ROBAXIN) 500 MG tablet; Take 1 tablet (500 mg total) by mouth 4 (four) times daily for 14 days.    Patient has been counseled on age-appropriate routine health concerns for screening and prevention. These are reviewed and up-to-date. Referrals have been placed accordingly. Immunizations are up-to-date or declined.    Subjective:   Chief Complaint  Patient presents with  . Establish Care   HPI Robert Stout 42 y.o. male presents to office today to establish care.  He has a history of HIV, HSV, Gout, Genital Warts (being followed by ID).   Chest Pain Patient complains of left sided chest pain with cough. Onset of cough was several weeks ago. Onset of chest pain associated with cough was one week ago with worsening course since that time. The patient describes the pain as constant, occurring with increasing frequency, awakening patient from sleep, costochondral and sharp in nature, does not radiate. Patient rates pain as a 6/10 in intensity.  Associated symptoms are none. Aggravating factors are coughing and deep inspiration.  Alleviating factors are: rest. Patient's cardiac risk factors are male gender and obesity (BMI >= 30 kg/m2).  Patient's risk factors for DVT/PE: none. Previous cardiac testing: electrocardiogram (ECG). He has taken [facility] powder, ibuprofen, theraflu and other OTC medications for the cough with no relief of symptoms.  Cough is Productive cough with non purulent thick sputum. He denies any fever. He has not applied for financial assistance and states he is unable to afford the cost of an OOP xray. Patient has been advised and encouraged to apply for financial assistance and schedule to see our financial counselor.   Review  of Systems  Constitutional: Negative for fever, malaise/fatigue and weight loss.  HENT: Negative.  Negative for nosebleeds.   Eyes: Negative.  Negative for blurred vision, double vision and photophobia.  Respiratory: Positive for cough and sputum production. Negative for shortness of breath.   Cardiovascular: Positive for chest pain. Negative for palpitations and leg swelling.  Gastrointestinal: Negative.  Negative for heartburn, nausea and vomiting.  Musculoskeletal: Negative.  Negative for myalgias.  Neurological: Negative.  Negative for dizziness, focal weakness, seizures and headaches.  Psychiatric/Behavioral: Negative.  Negative for suicidal ideas.    Past Medical History:  Diagnosis Date  . Caries 01/03/2017  . Genital warts 10/28/2016  . 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 HIV infection (HCC) Dx 2011    Past Surgical History:  Procedure Laterality Date  . TENDON REPAIR      Family History  Problem Relation Age of Onset  . Diabetes Mother   . Hypertension Mother     Social History Reviewed with no changes to be made today.   Outpatient Medications Prior to Visit  Medication Sig Dispense Refill  . bictegravir-emtricitabine-tenofovir AF (BIKTARVY) 50-200-25 MG TABS tablet Take 1 tablet by mouth daily. 30 tablet 11  . albuterol (PROVENTIL HFA;VENTOLIN HFA) 108 (90 Base) MCG/ACT inhaler Inhale 2 puffs into the lungs every 6 (six) hours as needed for wheezing or shortness of breath. (Patient not taking: Reported on 02/06/2018) 1 Inhaler 5  . allopurinol (ZYLOPRIM) 100 MG tablet Take 1  tablet (100 mg total) by mouth daily. (Patient not taking: Reported on 02/06/2018) 30 tablet 11  . famciclovir (FAMVIR) 250 MG tablet Take 1 tablet (250 mg total) by mouth 2 (two) times daily. (Patient not taking: Reported on 02/06/2018) 60 tablet 2  . imiquimod (ALDARA) 5 % cream APPLY TOPICALLY TO THE AFFECTED AREA 3 TIMES WEEKLY (Patient  not taking: Reported on 02/06/2018) 12 each 1  . mometasone-formoterol (DULERA) 100-5 MCG/ACT AERO Inhale 2 puffs into the lungs 2 (two) times daily. (Patient not taking: Reported on 02/06/2018) 1 Inhaler 5  . Spacer/Aero Chamber Mouthpiece MISC Use the spacer to administer albuterol (Patient not taking: Reported on 02/06/2018) 1 each 5  . naproxen (NAPROSYN) 500 MG tablet Take 1 tablet (500 mg total) by mouth 2 (two) times daily. (Patient not taking: Reported on 02/06/2018) 30 tablet 0   No facility-administered medications prior to visit.     No Known Allergies     Objective:    BP 129/86   Pulse 86   Temp 98.7 F (37.1 C) (Oral)   Ht 5\' 10"  (1.778 m)   Wt 286 lb 3.2 oz (129.8 kg)   SpO2 95%   BMI 41.07 kg/m  Wt Readings from Last 3 Encounters:  02/06/18 286 lb 3.2 oz (129.8 kg)  11/08/17 278 lb (126.1 kg)  04/11/17 273 lb (123.8 kg)    Physical Exam Vitals signs and nursing note reviewed.  Constitutional:      Appearance: He is well-developed.  HENT:     Head: Normocephalic and atraumatic.  Neck:     Musculoskeletal: Normal range of motion.  Cardiovascular:     Rate and Rhythm: Normal rate and regular rhythm.     Heart sounds: Normal heart sounds. No murmur. No friction rub. No gallop.   Pulmonary:     Effort: Pulmonary effort is normal. No tachypnea or respiratory distress.     Breath sounds: Normal breath sounds. No decreased breath sounds, wheezing, rhonchi or rales.  Chest:     Chest wall: Tenderness (reproducible chest wall tenderness) present. No mass, swelling, crepitus or edema. There is no dullness to percussion.    Abdominal:     General: Bowel sounds are normal.     Palpations: Abdomen is soft.  Musculoskeletal: Normal range of motion.  Skin:    General: Skin is warm and dry.  Neurological:     Mental Status: He is alert and oriented to person, place, and time.     Coordination: Coordination normal.  Psychiatric:        Behavior: Behavior normal.  Behavior is cooperative.        Thought Content: Thought content normal.        Judgment: Judgment normal.          Patient has been counseled extensively about nutrition and exercise as well as the importance of adherence with medications and regular follow-up. The patient was given clear instructions to go to ER or return to medical center if symptoms don't improve, worsen or new problems develop. The patient verbalized understanding.   Follow-up: Return in about 3 months (around 05/08/2018) for FASTING labs and Physical.   Claiborne RiggZelda W Byard Carranza, FNP-BC [facility] [place], [place] [phone]   02/06/2018, 6:19 PM

## 2018-02-07 ENCOUNTER — Encounter: Payer: Self-pay | Admitting: Infectious Diseases

## 2018-02-07 ENCOUNTER — Ambulatory Visit (INDEPENDENT_AMBULATORY_CARE_PROVIDER_SITE_OTHER): Payer: Self-pay | Admitting: Infectious Diseases

## 2018-02-07 VITALS — BP 141/94 | HR 88 | Temp 98.1°F | Wt 283.0 lb

## 2018-02-07 DIAGNOSIS — B9789 Other viral agents as the cause of diseases classified elsewhere: Secondary | ICD-10-CM

## 2018-02-07 DIAGNOSIS — B20 Human immunodeficiency virus [HIV] disease: Secondary | ICD-10-CM

## 2018-02-07 DIAGNOSIS — R0781 Pleurodynia: Secondary | ICD-10-CM | POA: Insufficient documentation

## 2018-02-07 DIAGNOSIS — J069 Acute upper respiratory infection, unspecified: Secondary | ICD-10-CM

## 2018-02-07 MED ORDER — PREDNISONE 10 MG (21) PO TBPK: ORAL_TABLET | ORAL | 0 refills | Status: DC

## 2018-02-07 NOTE — Assessment & Plan Note (Signed)
Inflammatory in the setting of prolonged cough. Advised to continue robaxin at night to help him sleep and OTC cough suppressants ([name] will not cover rx syrup and he does not want this). Will give short course of prednisone to help with inflammation (40 mg dose max d/t boarderline BP). Advised to take with food during the day until gone to help with inflammation. Continue diclofenac cream as he can apply it.

## 2018-02-07 NOTE — Patient Instructions (Signed)
Continue your cream on your chest for the pain.   Cough - my favorite over the counter cough syrup is Delsym, honey and hot tea can help too.   Plenty of fluids and rest.   Will give you some prednisone to help reduce inflammation (cough and rib pain)  40 mg (4 pills) once a day with food for 2 days, then 30 mg (3 pills) once a day with food for 2 days, then  20 mg (2 pills) once a day with food for 2 days, then 10 mg (1 pill) once a day with food until gone.   Flonase is helpful with consistent use - takes about 2 weeks for full effect.    General Recommendations:    Please drink plenty of fluids.  Get plenty of rest   Sleep in humidified air  Use saline nasal sprays  Netti pot   OTC Medications:  Decongestants - helps relieve congestion / stuffy nose:   Flonase (generic fluticasone) or Nasacort (generic triamcinolone) - please make sure to use the "cross-over" technique at a 45 degree angle towards the opposite eye as opposed to straight up the nasal passageway.   Sudafed (generic pseudoephedrine - Note this is the one that is available behind the pharmacy counter); Products with phenylephrine (-PE) may also be used but is often not as effective as pseudoephedrine.   If you have HIGH BLOOD PRESSURE - Coricidin HBP; AVOID any product that is -D as this contains pseudoephedrine which may increase your blood pressure.  Afrin (oxymetazoline) every 6-8 hours for up to 3 days.   Allergies - helps relieve runny nose, itchy eyes and sneezing   Claritin (generic loratidine), Allegra (fexofenidine), or Zyrtec (generic cyrterizine) for runny nose. These medications should not cause drowsiness.  Note - Benadryl (generic diphenhydramine) may be used however may cause drowsiness  Cough -   Delsym or Robitussin (generic dextromethorphan)  Honey   Hot tea   Plenty of fluids   Expectorants - helps loosen mucus to ease removal   Mucinex (generic guaifenesin) as directed  on the package.   Sore Throat -   Salt water gargle   Chloraseptic (generic benzocaine) spray or lozenges / Sucrets (generic dyclonine)

## 2018-02-07 NOTE — Assessment & Plan Note (Signed)
Lung assessment benign. Chest x ray was reviewed personally and no evidence of lower airway infection. He has a dry/reactive cough with significant inflammation/erythema of the nasal mucosa and clear rhinorrhea. No sinus pain/tenderness. Provided with reassurance today that he will continue to slowly improve; not uncommon for these respiratory illnesses to linger. No evidence of bacterial process and no antibiotics needed today.  Prednisone, OTC symptomatic relief discussed, rest, fluids.

## 2018-02-07 NOTE — Assessment & Plan Note (Addendum)
Well controlled on Biktarvy with healthy CD4. Discussed he is not at risk for the same pneumonia he had when he was diagnosed. Routine follow up as scheduled.

## 2018-02-07 NOTE — Progress Notes (Signed)
Name: Robert Stout  DOB: 11/03/1975 MRN: [id number] PCP: [name], Mandesia R, FNP    Patient Active Problem List   Diagnosis Date Noted  . Pleuritic chest pain 02/07/2018  . Viral upper respiratory tract infection with cough 01/12/2017  . Caries 01/03/2017  . Genital warts 10/28/2016  . 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 Lung nodule   . Homelessness   . HIV disease (HCC) 02/07/2014     Subjective:  CC:  Cough x 3 weeks, chest pain under left rib.   HPI:  [name] has over the last 3 weeks had progressive cough with some associated nasal congestion and post nasal drip. He has tried many over the counter remedies such as Mucinex-DM, flonase (sporadic short term use), Tylenol cold and sinus without effect. He describes the pain to have been new over the last 5-7 days, sharp, increased with coughing/breathing in deeply and located under left breast between mid clavicular line and axillary line. He denies any fevers/chills, ear pain, sore throat, SOB, decreased functional capacity, sleep interruption. Cough is not productive and feels it is from discharge from stuffy nose mostly. He went to see his PCP yesterday and had a chest x-ray and was given robaxin and dicloenac topical gel. He has used the gel a few times and reports mild relief but unable to apply as often as indicated d/t work schedule. Robaxin does make him sleepy and he needs to be awake to drive for his job.   He has done very well on his HAART and missed no doses. Last labs reveal undetectable VL and CD4 700. He has a history of PJP pneumonia at diagnosis 1 year ago with CD4 70 at the time.    Review of Systems  Constitutional: Negative for chills, diaphoresis, fever, malaise/fatigue and weight loss.  HENT: Positive for congestion. Negative for ear discharge, ear pain, sinus pain and sore throat.   Eyes: Negative for blurred vision, pain, discharge and redness.    Respiratory: Positive for cough (dry, non productive ). Negative for sputum production, shortness of breath and wheezing.   Cardiovascular: Positive for chest pain (Left lower rib cage ). Negative for orthopnea and leg swelling.  Gastrointestinal: Negative for abdominal pain, diarrhea and vomiting.  Skin: Negative for rash.  Neurological: Negative for dizziness and headaches.  Psychiatric/Behavioral: The patient does not have insomnia.     Past Medical History:  Diagnosis Date  . Caries 01/03/2017  . Genital warts 10/28/2016  . 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 HIV infection [facility](HCC) Dx 2011    Outpatient Medications Prior to Visit  Medication Sig Dispense Refill  . allopurinol (ZYLOPRIM) 100 MG tablet Take 1 tablet (100 mg total) by mouth daily. 30 tablet 11  . bictegravir-emtricitabine-tenofovir AF (BIKTARVY) 50-200-25 MG TABS tablet Take 1 tablet by mouth daily. 30 tablet 11  . diclofenac sodium (VOLTAREN) 1 % GEL Apply 4 g topically 4 (four) times daily for 14 days. 100 g 0  . albuterol (PROVENTIL HFA;VENTOLIN HFA) 108 (90 Base) MCG/ACT inhaler Inhale 2 puffs into the lungs every 6 (six) hours as needed for wheezing or shortness of breath. (Patient not taking: Reported on 02/07/2018) 1 Inhaler 5  . famciclovir (FAMVIR) 250 MG tablet Take 1 tablet (250 mg total) by mouth 2 (two) times daily. 60 tablet 2  . imiquimod (ALDARA) 5 % cream APPLY TOPICALLY TO THE  AFFECTED AREA 3 TIMES WEEKLY (Patient not taking: Reported on 02/07/2018) 12 each 1  . methocarbamol (ROBAXIN) 500 MG tablet Take 1 tablet (500 mg total) by mouth 4 (four) times daily for 14 days. (Patient not taking: Reported on 02/07/2018) 60 tablet 1  . mometasone-formoterol (DULERA) 100-5 MCG/ACT AERO Inhale 2 puffs into the lungs 2 (two) times daily. (Patient not taking: Reported on 02/07/2018) 1 Inhaler 5  . Spacer/Aero Chamber Mouthpiece MISC Use the spacer to administer  albuterol (Patient not taking: Reported on 02/07/2018) 1 each 5   No facility-administered medications prior to visit.      No Known Allergies  Social History   Tobacco Use  . Smoking status: Never Smoker  . Smokeless tobacco: Never Used  Substance Use Topics  . Alcohol use: No    Alcohol/week: 0.0 standard drinks  . Drug use: No    Social History   Substance and Sexual Activity  Sexual Activity Never     Objective:   Vitals:   02/07/18 1511  BP: (!) 141/94  Pulse: 88  Temp: 98.1 F (36.7 C)  SpO2: 97%  Weight: 283 lb (128.4 kg)   Body mass index is 40.61 kg/m.  Physical Exam Vitals signs reviewed.  Constitutional:      Appearance: Normal appearance. He is not ill-appearing.  HENT:     Head: Normocephalic.     Right Ear: Tympanic membrane normal.     Left Ear: Tympanic membrane normal.     Nose: Mucosal edema, congestion and rhinorrhea present.     Mouth/Throat:     Mouth: Mucous membranes are moist.     Pharynx: Oropharynx is clear. No oropharyngeal exudate.  Eyes:     General: No scleral icterus.    Conjunctiva/sclera: Conjunctivae normal.     Pupils: Pupils are equal, round, and reactive to light.  Cardiovascular:     Rate and Rhythm: Normal rate and regular rhythm.     Heart sounds: No murmur.  Pulmonary:     Effort: Pulmonary effort is normal.     Breath sounds: Normal breath sounds. No wheezing, rhonchi or rales.  Chest:     Chest wall: Tenderness (LL rib cage ) present.  Lymphadenopathy:     Cervical: No cervical adenopathy.  Neurological:     Mental Status: He is alert.    Lab Results Lab Results  Component Value Date   WBC 5.9 10/25/2017   HGB 14.0 10/25/2017   HCT 41.0 10/25/2017   MCV 92.6 10/25/2017   PLT 324 10/25/2017    Lab Results  Component Value Date   CREATININE 1.48 (H) 10/25/2017   BUN 9 10/25/2017   NA 137 10/25/2017   K 3.9 10/25/2017   CL 102 10/25/2017   CO2 27 10/25/2017    Lab Results  Component Value  Date   ALT 21 10/25/2017   AST 31 10/25/2017   ALKPHOS 68 09/20/2016   BILITOT 0.4 10/25/2017    Lab Results  Component Value Date   CHOL 199 10/25/2017   HDL 36 (L) 10/25/2017   LDLCALC 131 (H) 10/25/2017   TRIG 184 (H) 10/25/2017   CHOLHDL 5.5 (H) 10/25/2017   HIV 1 RNA Quant (copies/mL)  Date Value  10/25/2017 <20 DETECTED (A)  03/28/2017 82 (H)  10/28/2016 33 (H)   CD4 T Cell Abs (/uL)  Date Value  10/25/2017 700  03/28/2017 660  10/28/2016 400     Assessment & Plan:   Problem List Items Addressed This Visit  Unprioritized   Viral upper respiratory tract infection with cough - Primary    Lung assessment benign. Chest x ray was reviewed personally and no evidence of lower airway infection. He has a dry/reactive cough with significant inflammation/erythema of the nasal mucosa and clear rhinorrhea. No sinus pain/tenderness. Provided with reassurance today that he will continue to slowly improve; not uncommon for these respiratory illnesses to linger. No evidence of bacterial process and no antibiotics needed today.  Prednisone, OTC symptomatic relief discussed, rest, fluids.       Pleuritic chest pain    Inflammatory in the setting of prolonged cough. Advised to continue robaxin at night to help him sleep and OTC cough suppressants ([name] will not cover rx syrup and he does not want this). Will give short course of prednisone to help with inflammation (40 mg dose max d/t boarderline BP). Advised to take with food during the day until gone to help with inflammation. Continue diclofenac cream as he can apply it.       HIV disease (HCC) (Chronic)    Well controlled on Biktarvy with healthy CD4. Discussed he is not at risk for the same pneumonia he had when he was diagnosed. Routine follow up as scheduled.          Rexene Alberts, MSN, NP-C [facility] Pager: [phone] Office: [phone]  02/07/18    4:14 PM

## 2018-03-15 ENCOUNTER — Encounter: Payer: Self-pay | Admitting: Nurse Practitioner

## 2018-03-16 ENCOUNTER — Other Ambulatory Visit: Payer: Self-pay

## 2018-03-16 DIAGNOSIS — B20 Human immunodeficiency virus [HIV] disease: Secondary | ICD-10-CM

## 2018-03-16 MED ORDER — DOXYCYCLINE HYCLATE 100 MG PO TABS: ORAL_TABLET | ORAL | 0 refills | Status: DC

## 2018-03-27 ENCOUNTER — Other Ambulatory Visit: Payer: Self-pay | Admitting: Behavioral Health

## 2018-03-27 DIAGNOSIS — J4520 Mild intermittent asthma, uncomplicated: Secondary | ICD-10-CM

## 2018-03-27 MED ORDER — MOMETASONE FURO-FORMOTEROL FUM 100-5 MCG/ACT IN AERO: 2.0000 | INHALATION_SPRAY | Freq: Two times a day (BID) | RESPIRATORY_TRACT | 5 refills | Status: DC

## 2018-04-18 ENCOUNTER — Encounter: Payer: Self-pay | Admitting: Infectious Disease

## 2018-04-25 ENCOUNTER — Other Ambulatory Visit: Payer: Self-pay

## 2018-04-25 DIAGNOSIS — B20 Human immunodeficiency virus [HIV] disease: Secondary | ICD-10-CM

## 2018-04-26 LAB — HEMOGLOBIN A1C
HEMOGLOBIN A1C: 6.8 %{Hb} — AB (ref <5.7)
Mean Plasma Glucose: 148 (calc)
eAG (mmol/L): 8.2 (calc)

## 2018-04-26 LAB — COMPREHENSIVE METABOLIC PANEL
AG Ratio: 1.5 (calc) (ref 1.0–2.5)
ALT: 19 U/L (ref 9–46)
AST: 24 U/L (ref 10–40)
Albumin: 4.4 g/dL (ref 3.6–5.1)
Alkaline phosphatase (APISO): 64 U/L (ref 36–130)
BUN/Creatinine Ratio: 6 (calc) (ref 6–22)
BUN: 9 mg/dL (ref 7–25)
CO2: 26 mmol/L (ref 20–32)
CREATININE: 1.45 mg/dL — AB (ref 0.60–1.35)
Calcium: 9.5 mg/dL (ref 8.6–10.3)
Chloride: 104 mmol/L (ref 98–110)
Globulin: 3 g/dL (calc) (ref 1.9–3.7)
Glucose, Bld: 100 mg/dL — ABNORMAL HIGH (ref 65–99)
Potassium: 4.2 mmol/L (ref 3.5–5.3)
Sodium: 138 mmol/L (ref 135–146)
Total Bilirubin: 0.5 mg/dL (ref 0.2–1.2)
Total Protein: 7.4 g/dL (ref 6.1–8.1)

## 2018-04-26 LAB — CBC
HCT: 41.7 % (ref 38.5–50.0)
Hemoglobin: 14.2 g/dL (ref 13.2–17.1)
MCH: 31.1 pg (ref 27.0–33.0)
MCHC: 34.1 g/dL (ref 32.0–36.0)
MCV: 91.2 fL (ref 80.0–100.0)
MPV: 9.9 fL (ref 7.5–12.5)
Platelets: 329 10*3/uL (ref 140–400)
RBC: 4.57 10*6/uL (ref 4.20–5.80)
RDW: 12.7 % (ref 11.0–15.0)
WBC: 5.4 10*3/uL (ref 3.8–10.8)

## 2018-04-26 LAB — LIPID PANEL
Cholesterol: 182 mg/dL (ref <200)
HDL: 36 mg/dL — ABNORMAL LOW (ref >=40)
LDL Cholesterol (Calc): 120 mg/dL (calc) — ABNORMAL HIGH
Non-HDL Cholesterol (Calc): 146 mg/dL (calc) — ABNORMAL HIGH (ref <130)
Total CHOL/HDL Ratio: 5.1 (calc) — ABNORMAL HIGH (ref <5.0)
Triglycerides: 147 mg/dL (ref <150)

## 2018-04-26 LAB — RPR: RPR Ser Ql: NONREACTIVE

## 2018-04-26 LAB — T-HELPER CELL (CD4) - (RCID CLINIC ONLY)
CD4 % Helper T Cell: 25 % — ABNORMAL LOW (ref 33–55)
CD4 T Cell Abs: 760 /uL (ref 400–2700)

## 2018-04-27 LAB — HIV-1 RNA QUANT-NO REFLEX-BLD
HIV 1 RNA Quant: <20 copies/mL — AB
HIV-1 RNA Quant, Log: <1.3 Log copies/mL — AB

## 2018-05-09 ENCOUNTER — Telehealth: Payer: Self-pay | Admitting: Pharmacy Technician

## 2018-05-09 ENCOUNTER — Other Ambulatory Visit: Payer: Self-pay

## 2018-05-09 ENCOUNTER — Ambulatory Visit (INDEPENDENT_AMBULATORY_CARE_PROVIDER_SITE_OTHER): Payer: Self-pay | Admitting: Infectious Disease

## 2018-05-09 ENCOUNTER — Encounter: Payer: Self-pay | Admitting: Infectious Disease

## 2018-05-09 VITALS — Wt 282.0 lb

## 2018-05-09 DIAGNOSIS — B20 Human immunodeficiency virus [HIV] disease: Secondary | ICD-10-CM

## 2018-05-09 DIAGNOSIS — R635 Abnormal weight gain: Secondary | ICD-10-CM

## 2018-05-09 DIAGNOSIS — R7303 Prediabetes: Secondary | ICD-10-CM

## 2018-05-09 DIAGNOSIS — J4520 Mild intermittent asthma, uncomplicated: Secondary | ICD-10-CM

## 2018-05-09 DIAGNOSIS — J209 Acute bronchitis, unspecified: Secondary | ICD-10-CM

## 2018-05-09 MED ORDER — MOMETASONE FURO-FORMOTEROL FUM 100-5 MCG/ACT IN AERO: 2.0000 | INHALATION_SPRAY | Freq: Two times a day (BID) | RESPIRATORY_TRACT | 11 refills | Status: DC

## 2018-05-09 MED ORDER — ALBUTEROL SULFATE HFA 108 (90 BASE) MCG/ACT IN AERS: 2.0000 | INHALATION_SPRAY | Freq: Four times a day (QID) | RESPIRATORY_TRACT | 8 refills | Status: DC | PRN

## 2018-05-09 NOTE — Telephone Encounter (Signed)
[facility] Patient Advocate Encounter  Completed manufacturer application for Albuterol and Dulera.    Application completed and faxed to [employer].   This encounter will be updated until final determination.   Netty Starring. Dimas Aguas CPhT [facility] Patient [facility] Phone: [phone] Fax:  [phone]

## 2018-05-09 NOTE — Progress Notes (Signed)
Subjective:   Chief complaints: He is concerned about becoming prediabetic with his A1c up to 6.8   Patient ID: Robert Stout, male    DOB: 1975/06/23, 43 y.o.   MRN: [id number]  HPI   Robert Stout is a 43 year-old man who was previously diagnosed HIV and then  incarcerated released but not reengaged in care and ultimately was admitted to the hospital with was thought to be PCP pneumonia and AIDS. We placed him on BIKTARVY though obtaining the medication was very difficult to to problems with meds from [facility] being lost by the pulsatile service. He has responded to get his medication and with repeat viral load is dropped his viral load from well over 300,000 or 116 copies. He is also nicely restored his T cells from a value of 70 to 330 and now into 400s.  He actually has been able to obtain housing now which really is fantastic news.    He does have comorbid asthma for which he was taking Dulera along with albuterol.  He is now out of those medications at present says he has used albuterol medications a few times a week.      Past Medical History:  Diagnosis Date  . Caries 01/03/2017  . Genital warts 10/28/2016  . 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 HIV infection (HCC) Dx 2011    Past Surgical History:  Procedure Laterality Date  . TENDON REPAIR      Family History  Problem Relation Age of Onset  . Diabetes Mother   . Hypertension Mother       Social History   Socioeconomic History  . Marital status: Single    Spouse name: Not on file  . Number of children: Not on file  . Years of education: Not on file  . Highest education level: Not on file  Occupational History  . Not on file  Social Needs  . Financial resource strain: Not on file  . Food insecurity:    Worry: Not on file    Inability: Not on file  . Transportation needs:    Medical: Not on file    Non-medical: Not on file  Tobacco Use  . Smoking status:  Never Smoker  . Smokeless tobacco: Never Used  Substance and Sexual Activity  . Alcohol use: No    Alcohol/week: 0.0 standard drinks  . Drug use: No  . Sexual activity: Never  Lifestyle  . Physical activity:    Days per week: Not on file    Minutes per session: Not on file  . Stress: Not on file  Relationships  . Social connections:    Talks on phone: Not on file    Gets together: Not on file    Attends religious service: Not on file    Active member of club or organization: Not on file    Attends meetings of clubs or organizations: Not on file    Relationship status: Not on file  Other Topics Concern  . Not on file  Social History Narrative  . Not on file    No Known Allergies   Current Outpatient Medications:  .  albuterol (PROVENTIL HFA;VENTOLIN HFA) 108 (90 Base) MCG/ACT inhaler, Inhale 2 puffs into the lungs every 6 (six) hours as needed for wheezing or shortness of breath. (Patient not taking: Reported on 02/07/2018), Disp: 1 Inhaler, Rfl: 5 .  allopurinol (ZYLOPRIM) 100 MG tablet, Take 1 tablet (  100 mg total) by mouth daily., Disp: 30 tablet, Rfl: 11 .  bictegravir-emtricitabine-tenofovir AF (BIKTARVY) 50-200-25 MG TABS tablet, Take 1 tablet by mouth daily., Disp: 30 tablet, Rfl: 11 .  doxycycline (VIBRA-TABS) 100 MG tablet, Take twice a day with food, Disp: 14 tablet, Rfl: 0 .  famciclovir (FAMVIR) 250 MG tablet, Take 1 tablet (250 mg total) by mouth 2 (two) times daily., Disp: 60 tablet, Rfl: 2 .  imiquimod (ALDARA) 5 % cream, APPLY TOPICALLY TO THE AFFECTED AREA 3 TIMES WEEKLY (Patient not taking: Reported on 02/07/2018), Disp: 12 each, Rfl: 1 .  mometasone-formoterol (DULERA) 100-5 MCG/ACT AERO, Inhale 2 puffs into the lungs 2 (two) times daily., Disp: 1 Inhaler, Rfl: 5 .  predniSONE (STERAPRED UNI-PAK 21 TAB) 10 MG (21) TBPK tablet, Start taking 40 mg (4 pills) once a day with meals for 2 days, then 3 pills x 2 days, 2 pills x 2 days then 1 pill until gone., Disp: 21  tablet, Rfl: 0 .  Spacer/Aero Chamber Mouthpiece MISC, Use the spacer to administer albuterol (Patient not taking: Reported on 02/07/2018), Disp: 1 each, Rfl: 5    Review of Systems  Constitutional: Positive for unexpected weight change. Negative for chills and fever.  HENT: Negative for congestion, dental problem and sore throat.   Eyes: Negative for photophobia.  Respiratory: Negative for cough, shortness of breath and wheezing.   Cardiovascular: Negative for chest pain, palpitations and leg swelling.  Gastrointestinal: Negative for abdominal pain, blood in stool, constipation, diarrhea, nausea and vomiting.  Genitourinary: Negative for dysuria, flank pain and hematuria.  Musculoskeletal: Negative for arthralgias, back pain and myalgias.  Skin: Negative for rash.  Neurological: Negative for dizziness, weakness and headaches.  Hematological: Does not bruise/bleed easily.  Psychiatric/Behavioral: Negative for suicidal ideas.       Objective:   Physical Exam Vitals signs and nursing note reviewed.  Constitutional:      General: He is not in acute distress.    Appearance: He is well-developed. He is not diaphoretic.  HENT:     Head: Normocephalic and atraumatic.     Mouth/Throat:     Dentition: Dental caries present.     Pharynx: No oropharyngeal exudate.  Eyes:     General: No scleral icterus.    Conjunctiva/sclera: Conjunctivae normal.  Neck:     Musculoskeletal: Normal range of motion and neck supple.  Cardiovascular:     Rate and Rhythm: Normal rate and regular rhythm.     Heart sounds: Normal heart sounds. No murmur. No friction rub. No gallop.   Pulmonary:     Effort: Pulmonary effort is normal. No respiratory distress.     Breath sounds: Normal breath sounds. No wheezing or rales.  Abdominal:     General: There is no distension.     Palpations: Abdomen is soft.  Musculoskeletal:        General: No tenderness.  Skin:    General: Skin is warm and dry.      Coloration: Skin is not pale.     Findings: No erythema or rash.  Neurological:     Mental Status: He is alert and oriented to person, place, and time.     Motor: No abnormal muscle tone.     Coordination: Coordination normal.  Psychiatric:        Behavior: Behavior normal.        Thought Content: Thought content normal.        Judgment: Judgment normal.  Assessment & Plan:   HIV and AIDS: Continue BIKTARVY,. HMAP renewed and RTC in the summer  Asthma: Pharmacy to meet with him to see where he can get [facility] and albuterol filled and if he can get assistance for these medications.  Prediabetes and weight gain have asked him to carbohydrate restricted his diet.  We may have to also consider getting him off and integrates strand transfer inhibitor.  I spent greater than 25 minutes with the patient including greater than 50% of time in face to face counsel of the patient guarding his prediabetic state weight gain dietary maneuvers history of antiretrovirals current laboratory data and in coordination of his care.

## 2018-05-10 NOTE — Telephone Encounter (Signed)
[facility] Patient Advocate Encounter  Completed manufacturer application for [facility] and Ventolin inhalers in an effort to reduce patient's out of pocket expense.    Application completed and faxed to [employer] patient assistance.   This encounter will be updated until final determination.   Netty Starring. Dimas Aguas CPhT Specialty Pharmacy Patient [facility] for Infectious Disease Phone: [phone] Fax:  [phone]

## 2018-05-12 ENCOUNTER — Other Ambulatory Visit: Payer: Self-pay

## 2018-05-12 ENCOUNTER — Encounter: Payer: Self-pay | Admitting: Nurse Practitioner

## 2018-05-12 ENCOUNTER — Ambulatory Visit: Payer: Self-pay | Admitting: Nurse Practitioner

## 2018-05-15 NOTE — Progress Notes (Signed)
This encounter was created in error - please disregard.

## 2018-05-25 ENCOUNTER — Other Ambulatory Visit: Payer: Self-pay

## 2018-05-29 NOTE — Telephone Encounter (Signed)
The representative said they will only accept mailed applications for [facility], not faxed.  I let her know the past 4 people did not let me know that.  She apologized.  I mailed the application today.  Netty Starring. Dimas Aguas CPhT Specialty Pharmacy Patient [facility] for Infectious Disease Phone: [phone] Fax:  [phone]

## 2018-06-08 ENCOUNTER — Encounter: Payer: Self-pay | Admitting: Infectious Disease

## 2018-06-12 ENCOUNTER — Telehealth: Payer: Self-pay | Admitting: Infectious Disease

## 2018-06-12 NOTE — Telephone Encounter (Signed)
I'll take a look when I get in tomorrow.

## 2018-06-12 NOTE — Telephone Encounter (Signed)
Patient received a letter from [employer] saying the application could not be completed because it needs another signature.  Patient is wanting to know what he needs to do.

## 2018-06-13 ENCOUNTER — Telehealth: Payer: Self-pay | Admitting: Pharmacist

## 2018-06-13 MED FILL — VENTOLIN HFA 90 MCG INHALER: 108 (90 BAS | 25 days supply | Qty: 18 | Fill #0

## 2018-06-13 NOTE — Telephone Encounter (Signed)
Patient is uninsured and has HMAP. He is in need of Dulera and Albuterol inhalers which are not on [employer]'s formulary.  We have been trying to get him approved for patient assistance through the drug company, [employer], for over a month and they aren't cooperating. We had to mail the application in and it was missing a signature so they mailed it back to the patient.  He will drop it off today, and we will finish that and mail it back.   In the meantime, [facility] can fill both inhalers for $10 each. Patient has a Dulera inhaler that he is using now but needs the Albuterol badly.  He will go to [facility] and pick up his Albuterol today. Hopefully we can get this [employer] situation figured out and get him approved for medication assistance before he needs a refill next month.

## 2018-06-13 NOTE — Telephone Encounter (Signed)
Thanks so much Cassie  I really appreciated

## 2018-06-15 ENCOUNTER — Other Ambulatory Visit: Payer: Self-pay | Admitting: Infectious Disease

## 2018-06-15 DIAGNOSIS — B20 Human immunodeficiency virus [HIV] disease: Secondary | ICD-10-CM

## 2018-06-23 NOTE — Telephone Encounter (Signed)
[facility], the pharmacy in [place] for [employer] called needed the Chiropractor with [employer].  I gave the Paulette Blanch Dam's name and NPI and they said that is all that they need.  Netty Starring. Dimas Aguas CPhT Specialty Pharmacy Patient [facility] for Infectious Disease Phone: [phone] Fax:  [phone]

## 2018-06-28 NOTE — Telephone Encounter (Signed)
Robert Stout has been approved for Proventil and Dulera inhalers through [employer] Patient Assistance.  They will arrive to his home 07/01/2018 and the tracking number is [id number].  Netty Starring. Dimas Aguas CPhT Specialty Pharmacy Patient [facility] for Infectious Disease Phone: [phone] Fax:  [phone]

## 2018-08-07 ENCOUNTER — Other Ambulatory Visit: Payer: Self-pay | Admitting: *Deleted

## 2018-08-07 DIAGNOSIS — B20 Human immunodeficiency virus [HIV] disease: Secondary | ICD-10-CM

## 2018-08-07 MED ORDER — VALACYCLOVIR HCL 500 MG PO TABS: 500.0000 mg | ORAL_TABLET | Freq: Two times a day (BID) | ORAL | 0 refills | Status: DC

## 2018-08-14 ENCOUNTER — Encounter: Payer: Self-pay | Admitting: Nurse Practitioner

## 2018-09-18 ENCOUNTER — Telehealth: Payer: Self-pay | Admitting: Pharmacy Technician

## 2018-09-18 NOTE — Telephone Encounter (Signed)
[facility] Patient Advocate Encounter  Patient called concerned that [employer] was unable to process his medication refill request. They told him to reach out to the provider to get the prescription fixed. Called the [employer] [phone]) and provided them the necessary information to send the medication and patient's updated address. Patient is aware and will alert [place] at his next appointment if he has not yet received the medication.

## 2018-09-25 ENCOUNTER — Other Ambulatory Visit: Payer: Self-pay

## 2018-09-25 ENCOUNTER — Encounter: Payer: Self-pay | Admitting: Infectious Disease

## 2018-09-25 ENCOUNTER — Ambulatory Visit: Payer: Self-pay

## 2018-09-25 DIAGNOSIS — B20 Human immunodeficiency virus [HIV] disease: Secondary | ICD-10-CM

## 2018-09-26 LAB — T-HELPER CELL (CD4) - (RCID CLINIC ONLY)
CD4 % Helper T Cell: 29 % — ABNORMAL LOW (ref 33–65)
CD4 T Cell Abs: 733 /uL (ref 400–1790)

## 2018-10-03 LAB — COMPLETE METABOLIC PANEL WITH GFR
AG Ratio: 1.3 (calc) (ref 1.0–2.5)
ALT: 15 U/L (ref 9–46)
AST: 20 U/L (ref 10–40)
Albumin: 4 g/dL (ref 3.6–5.1)
Alkaline phosphatase (APISO): 61 U/L (ref 36–130)
BUN/Creatinine Ratio: 7 (calc) (ref 6–22)
BUN: 11 mg/dL (ref 7–25)
CO2: 26 mmol/L (ref 20–32)
Calcium: 9.3 mg/dL (ref 8.6–10.3)
Chloride: 105 mmol/L (ref 98–110)
Creat: 1.49 mg/dL — ABNORMAL HIGH (ref 0.60–1.35)
GFR, Est African American: 65 mL/min/{1.73_m2} (ref >=60)
GFR, Est Non African American: 56 mL/min/{1.73_m2} — ABNORMAL LOW (ref >=60)
Globulin: 3 g/dL (calc) (ref 1.9–3.7)
Glucose, Bld: 66 mg/dL (ref 65–99)
Potassium: 3.9 mmol/L (ref 3.5–5.3)
Sodium: 139 mmol/L (ref 135–146)
Total Bilirubin: 0.3 mg/dL (ref 0.2–1.2)
Total Protein: 7 g/dL (ref 6.1–8.1)

## 2018-10-03 LAB — CBC WITH DIFFERENTIAL/PLATELET
Absolute Monocytes: 504 cells/uL (ref 200–950)
Basophils Absolute: 42 cells/uL (ref 0–200)
Basophils Relative: 0.6 %
Eosinophils Absolute: 140 cells/uL (ref 15–500)
Eosinophils Relative: 2 %
HCT: 40.3 % (ref 38.5–50.0)
Hemoglobin: 13.6 g/dL (ref 13.2–17.1)
Lymphs Abs: 2583 cells/uL (ref 850–3900)
MCH: 31.5 pg (ref 27.0–33.0)
MCHC: 33.7 g/dL (ref 32.0–36.0)
MCV: 93.3 fL (ref 80.0–100.0)
MPV: 9.7 fL (ref 7.5–12.5)
Monocytes Relative: 7.2 %
Neutro Abs: 3731 cells/uL (ref 1500–7800)
Neutrophils Relative %: 53.3 %
Platelets: 309 10*3/uL (ref 140–400)
RBC: 4.32 10*6/uL (ref 4.20–5.80)
RDW: 13.1 % (ref 11.0–15.0)
Total Lymphocyte: 36.9 %
WBC: 7 10*3/uL (ref 3.8–10.8)

## 2018-10-03 LAB — HIV-1 RNA QUANT-NO REFLEX-BLD
HIV 1 RNA Quant: <20 copies/mL
HIV-1 RNA Quant, Log: <1.3 Log copies/mL

## 2018-10-03 LAB — RPR: RPR Ser Ql: NONREACTIVE

## 2018-10-09 ENCOUNTER — Ambulatory Visit (INDEPENDENT_AMBULATORY_CARE_PROVIDER_SITE_OTHER): Payer: Self-pay | Admitting: Infectious Disease

## 2018-10-09 ENCOUNTER — Other Ambulatory Visit: Payer: Self-pay

## 2018-10-09 ENCOUNTER — Encounter: Payer: Self-pay | Admitting: Infectious Disease

## 2018-10-09 ENCOUNTER — Other Ambulatory Visit (HOSPITAL_COMMUNITY)
Admission: RE | Admit: 2018-10-09 | Discharge: 2018-10-09 | Disposition: A | Discharge status: Home / Self Care | Payer: Self-pay | Source: Ambulatory Visit | Attending: Infectious Disease | Admitting: Infectious Disease

## 2018-10-09 VITALS — BP 134/86 | HR 73 | Temp 98.4°F | Wt 267.0 lb

## 2018-10-09 DIAGNOSIS — Z79899 Other long term (current) drug therapy: Secondary | ICD-10-CM

## 2018-10-09 DIAGNOSIS — K625 Hemorrhage of anus and rectum: Secondary | ICD-10-CM | POA: Insufficient documentation

## 2018-10-09 DIAGNOSIS — Z113 Encounter for screening for infections with a predominantly sexual mode of transmission: Secondary | ICD-10-CM

## 2018-10-09 DIAGNOSIS — A630 Anogenital (venereal) warts: Secondary | ICD-10-CM

## 2018-10-09 DIAGNOSIS — B20 Human immunodeficiency virus [HIV] disease: Secondary | ICD-10-CM

## 2018-10-09 DIAGNOSIS — Z6837 Body mass index (BMI) 37.0-37.9, adult: Secondary | ICD-10-CM | POA: Insufficient documentation

## 2018-10-09 DIAGNOSIS — R635 Abnormal weight gain: Secondary | ICD-10-CM

## 2018-10-09 HISTORY — DX: Abnormal weight gain: R63.5

## 2018-10-09 HISTORY — DX: Hemorrhage of anus and rectum: K62.5

## 2018-10-09 MED ORDER — IMIQUIMOD 5 % EX CREA: TOPICAL_CREAM | CUTANEOUS | 3 refills | Status: DC

## 2018-10-09 NOTE — Progress Notes (Signed)
Subjective:   Chief complaints: lesions on his penis.  Intermittent rectal bleeding  Patient ID: Robert Stout, male    DOB: 1975/12/25, 43 y.o.   MRN: [id number]  HPI  Robert Stout is a 43 year-old African-American man living with HIV that is currently well controlled.  He did have some trouble with homelessness but currently has housing but no transportation in terms of having a car.  He had called in for a medicine for an "outbreak on his penis.  He was given Valtrex for 10 days without any benefit.  On exam however he clearly has genital warts which would certainly not respond to Valtrex.  I propose also screening for HPV with a rectal Pap smear.  He denies ever having any receptive anal intercourse but I counseled him that he still could have HPV in the rectum and that we should screen for this.  He also while you are talking about screening for HPV he mentioned that he has had intermittent blood per rectum depending on what he is eating in his diet.  Suspect he may have hemorrhoids though I could not visualize them on exam today.     Past Medical History:  Diagnosis Date  . Caries 01/03/2017  . Genital warts 10/28/2016  . 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 HIV infection (HCC) Dx 2011  . Rectal bleeding 10/09/2018  . Weight gain 10/09/2018    Past Surgical History:  Procedure Laterality Date  . TENDON REPAIR      Family History  Problem Relation Age of Onset  . Diabetes Mother   . Hypertension Mother       Social History   Socioeconomic History  . Marital status: Single    Spouse name: Not on file  . Number of children: Not on file  . Years of education: Not on file  . Highest education level: Not on file  Occupational History  . Not on file  Social Needs  . Financial resource strain: Not on file  . Food insecurity    Worry: Not on file    Inability: Not on file  . Transportation needs    Medical: Not on  file    Non-medical: Not on file  Tobacco Use  . Smoking status: Never Smoker  . Smokeless tobacco: Never Used  Substance and Sexual Activity  . Alcohol use: No    Alcohol/week: 0.0 standard drinks  . Drug use: No  . Sexual activity: Never  Lifestyle  . Physical activity    Days per week: Not on file    Minutes per session: Not on file  . Stress: Not on file  Relationships  . Social Musicianconnections    Talks on phone: Not on file    Gets together: Not on file    Attends religious service: Not on file    Active member of club or organization: Not on file    Attends meetings of clubs or organizations: Not on file    Relationship status: Not on file  Other Topics Concern  . Not on file  Social History Narrative  . Not on file    No Known Allergies   Current Outpatient Medications:  .  albuterol (PROVENTIL HFA;VENTOLIN HFA) 108 (90 Base) MCG/ACT inhaler, Inhale 2 puffs into the lungs every 6 (six) hours as needed for wheezing or shortness of breath., Disp: 1 Inhaler, Rfl: 8 .  BIKTARVY 50-200-25 MG TABS tablet, TAKE 1  TABLET BY MOUTH DAILY., Disp: 30 tablet, Rfl: 4 .  mometasone-formoterol (DULERA) 100-5 MCG/ACT AERO, Inhale 2 puffs into the lungs 2 (two) times daily., Disp: 1 Inhaler, Rfl: 11 .  valACYclovir (VALTREX) 500 MG tablet, Take 1 tablet (500 mg total) by mouth 2 (two) times daily. 1 TABLET DAILY, Disp: 14 tablet, Rfl: 0 .  imiquimod (ALDARA) 5 % cream, Apply topically 3 (three) times a week., Disp: 12 each, Rfl: 3    Review of Systems  Constitutional: Positive for unexpected weight change. Negative for chills and fever.  HENT: Negative for congestion, dental problem and sore throat.   Eyes: Negative for photophobia.  Respiratory: Negative for cough, shortness of breath and wheezing.   Cardiovascular: Negative for chest pain, palpitations and leg swelling.  Gastrointestinal: Positive for anal bleeding and rectal pain. Negative for abdominal pain, blood in stool,  constipation, diarrhea, nausea and vomiting.  Genitourinary: Negative for dysuria, flank pain and hematuria.  Musculoskeletal: Negative for arthralgias, back pain and myalgias.  Skin: Negative for rash.  Neurological: Negative for dizziness, weakness and headaches.  Hematological: Does not bruise/bleed easily.  Psychiatric/Behavioral: Negative for agitation, behavioral problems and suicidal ideas.       Objective:   Physical Exam Vitals signs and nursing note reviewed. Exam conducted with a chaperone present.  Constitutional:      General: He is not in acute distress.    Appearance: He is well-developed. He is not diaphoretic.  HENT:     Head: Normocephalic and atraumatic.     Mouth/Throat:     Dentition: Dental caries present.     Pharynx: No oropharyngeal exudate.  Eyes:     General: No scleral icterus.    Conjunctiva/sclera: Conjunctivae normal.  Neck:     Musculoskeletal: Normal range of motion and neck supple.  Cardiovascular:     Rate and Rhythm: Normal rate and regular rhythm.     Heart sounds: No friction rub.  Pulmonary:     Effort: Pulmonary effort is normal. No respiratory distress.     Breath sounds: No wheezing or rales.  Abdominal:     General: There is no distension.     Palpations: Abdomen is soft.  Genitourinary:    Rectum: Guaiac result positive. Tenderness present.      Musculoskeletal:        General: No tenderness.  Skin:    General: Skin is warm and dry.     Coloration: Skin is not pale.     Findings: No erythema or rash.  Neurological:     Mental Status: He is alert and oriented to person, place, and time.     Motor: No abnormal muscle tone.     Coordination: Coordination normal.  Psychiatric:        Behavior: Behavior normal.        Thought Content: Thought content normal.        Judgment: Judgment normal.     Dental work see picture October 09, 2018:          Assessment & Plan:   HIV and AIDS: Continue BIKTARVY,. HMAP renewed  and RTC in December for labs and renewal in January.    Asthma: Continue meds including Dulera through [name]  Weight gain is actually less weight through dietary restriction.  Penile lesions: These are genital warts now prescribe Aldara for him I also did a rectal Pap smear.  [facility] we get results back.  Blood per rectum with  tenderness on exam: Could potentially have hemorrhoids and/or rectal fissure: He may benefit by seeing general surgery who could help also with doing HRA.   Foot problems: He asked to be referred to a podiatrist that he could see that would not cost a large amount of money since he did not have insurance.  He can look into this with our referral coordinator tomorrow.  I spent greater than 40 minutes with the patient including greater than 50% of time in face to face counsel of the patient need to screen for HPV in terms of cancer screening, differential for blood per rectum work-up required and in coordination of his care

## 2018-10-09 NOTE — Addendum Note (Signed)
Addended by: Reggy Eye on: 10/09/2018 03:36 PM   Modules accepted: Orders

## 2018-10-17 ENCOUNTER — Telehealth: Payer: Self-pay

## 2018-10-17 NOTE — Telephone Encounter (Signed)
-----   Message from Truman Hayward, MD sent at 10/16/2018  9:16 AM EDT ----- Anal pap smear was normal with no HPV or malignant cells. He should continue the aldara for his genital warts

## 2018-10-17 NOTE — Telephone Encounter (Signed)
Patient made aware of anal pap results and advised to continue to take aldera. No questions/concerns voiced by patient. Patient was appreciative of phone call.  Robert Stout

## 2018-10-25 ENCOUNTER — Other Ambulatory Visit: Payer: Self-pay | Admitting: Infectious Disease

## 2018-10-25 DIAGNOSIS — B20 Human immunodeficiency virus [HIV] disease: Secondary | ICD-10-CM

## 2018-11-06 ENCOUNTER — Other Ambulatory Visit: Payer: Self-pay

## 2018-11-06 ENCOUNTER — Encounter: Payer: Self-pay | Admitting: Nurse Practitioner

## 2018-11-06 ENCOUNTER — Ambulatory Visit: Payer: Self-pay | Attending: Nurse Practitioner | Admitting: Nurse Practitioner

## 2018-11-06 ENCOUNTER — Ambulatory Visit (HOSPITAL_BASED_OUTPATIENT_CLINIC_OR_DEPARTMENT_OTHER): Payer: Self-pay | Admitting: Pharmacist

## 2018-11-06 VITALS — BP 107/70 | HR 84 | Temp 98.8°F | Ht 70.0 in | Wt 269.0 lb

## 2018-11-06 DIAGNOSIS — Z23 Encounter for immunization: Secondary | ICD-10-CM

## 2018-11-06 DIAGNOSIS — Z0000 Encounter for general adult medical examination without abnormal findings: Secondary | ICD-10-CM

## 2018-11-06 DIAGNOSIS — E669 Obesity, unspecified: Secondary | ICD-10-CM

## 2018-11-06 DIAGNOSIS — J209 Acute bronchitis, unspecified: Secondary | ICD-10-CM

## 2018-11-06 DIAGNOSIS — Z130 Encounter for screening for diseases of the blood and blood-forming organs and certain disorders involving the immune mechanism: Secondary | ICD-10-CM

## 2018-11-06 DIAGNOSIS — M79674 Pain in right toe(s): Secondary | ICD-10-CM

## 2018-11-06 LAB — GLUCOSE, POCT (MANUAL RESULT ENTRY): POC Glucose: 108 mg/dl — AB (ref 70–99)

## 2018-11-06 LAB — POCT GLYCOSYLATED HEMOGLOBIN (HGB A1C): Hemoglobin A1C: 5.9 % — AB (ref 4.0–5.6)

## 2018-11-06 NOTE — Progress Notes (Signed)
Assessment & Plan:  Robert Stout was seen today for annual exam.  Diagnoses and all orders for this visit:  Encounter for annual physical exam -     Glucose (CBG) -     HgB A1c Lab Results  Component Value Date   HGBA1C 5.9 (A) 11/06/2018    Obesity (BMI 30-39.9) Discussed diet and exercise for person with BMI >38. Instructed: You must burn more calories than you eat. Losing 5 percent of your body weight should be considered a success. In the longer term, losing more than 15 percent of your body weight and staying at this weight is an extremely good result. However, keep in mind that even losing 5 percent of your body weight leads to important health benefits, so try not to get discouraged if you're not able to lose more than this. Will recheck weight in 3-6 months.  Screening for deficiency anemia -     Cancel: CBC  Pain of right great toe -     Uric Acid    Patient has been counseled on age-appropriate routine health concerns for screening and prevention. These are reviewed and up-to-date. Referrals have been placed accordingly. Immunizations are up-to-date or declined.    Subjective:   Chief Complaint  Patient presents with  . Annual Exam    Pt. is here for a physical.    HPI Robert Stout 43 y.o. male presents to office today for annual physical.    Review of Systems  Constitutional: Negative for fever, malaise/fatigue and weight loss.  HENT: Negative.  Negative for nosebleeds.   Eyes: Negative.  Negative for blurred vision, double vision and photophobia.  Respiratory: Negative.  Negative for cough and shortness of breath.   Cardiovascular: Negative.  Negative for chest pain, palpitations and leg swelling.  Gastrointestinal: Negative.  Negative for heartburn, nausea and vomiting.  Genitourinary: Negative.   Musculoskeletal: Positive for joint pain. Negative for myalgias.  Skin: Negative.   Neurological: Negative.  Negative for dizziness, focal weakness, seizures and  headaches.  Endo/Heme/Allergies: Negative.   Psychiatric/Behavioral: Negative.  Negative for suicidal ideas.    Past Medical History:  Diagnosis Date  . Caries 01/03/2017  . Genital warts 10/28/2016  . 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 HIV infection ([place]) Dx 2011  . Rectal bleeding 10/09/2018  . Weight gain 10/09/2018    Past Surgical History:  Procedure Laterality Date  . TENDON REPAIR      Family History  Problem Relation Age of Onset  . Diabetes Mother   . Hypertension Mother     Social History Reviewed with no changes to be made today.   Outpatient Medications Prior to Visit  Medication Sig Dispense Refill  . albuterol (PROVENTIL HFA;VENTOLIN HFA) 108 (90 Base) MCG/ACT inhaler Inhale 2 puffs into the lungs every 6 (six) hours as needed for wheezing or shortness of breath. 1 Inhaler 8  . BIKTARVY 50-200-25 MG TABS tablet TAKE 1 TABLET BY MOUTH DAILY 30 tablet 4  . imiquimod (ALDARA) 5 % cream Apply topically 3 (three) times a week. 12 each 3  . mometasone-formoterol (DULERA) 100-5 MCG/ACT AERO Inhale 2 puffs into the lungs 2 (two) times daily. 1 Inhaler 11  . valACYclovir (VALTREX) 500 MG tablet Take 1 tablet (500 mg total) by mouth 2 (two) times daily. 1 TABLET DAILY 14 tablet 0   No facility-administered medications prior to visit.     No Known Allergies  Objective:    BP 107/70 (BP Location: Left Arm, Patient Position: Sitting, Cuff Size: Large)   Pulse 84   Temp 98.8 F (37.1 C) (Oral)   Ht 5\' 10"  (1.778 m)   Wt 269 lb (122 kg)   SpO2 97%   BMI 38.60 kg/m  Wt Readings from Last 3 Encounters:  11/06/18 269 lb (122 kg)  10/09/18 267 lb (121.1 kg)  05/12/18 285 lb (129.3 kg)    Physical Exam Constitutional:      Appearance: He is well-developed.  HENT:     Head: Normocephalic and atraumatic.     Right Ear: Hearing, tympanic membrane, ear canal and external ear normal.     Left Ear: Hearing,  tympanic membrane, ear canal and external ear normal.     Nose: Nose normal. No mucosal edema or rhinorrhea.     Mouth/Throat:     Pharynx: Uvula midline.     Tonsils: No tonsillar exudate. 1+ on the right. 1+ on the left.  Eyes:     General: Lids are normal. No scleral icterus.    Conjunctiva/sclera: Conjunctivae normal.     Pupils: Pupils are equal, round, and reactive to light.  Neck:     Musculoskeletal: Normal range of motion and neck supple.     Thyroid: No thyromegaly.     Trachea: No tracheal deviation.  Cardiovascular:     Rate and Rhythm: Normal rate and regular rhythm.     Pulses:          Dorsalis pedis pulses are 2+ on the right side and 2+ on the left side.       Posterior tibial pulses are 2+ on the right side and 2+ on the left side.     Heart sounds: Normal heart sounds. No murmur. No friction rub. No gallop.   Pulmonary:     Effort: Pulmonary effort is normal. No respiratory distress.     Breath sounds: Normal breath sounds. No wheezing or rales.  Chest:     Chest wall: No mass or tenderness.     Breasts:        Right: No inverted nipple, mass, nipple discharge, skin change or tenderness.        Left: No inverted nipple, mass, nipple discharge, skin change or tenderness.  Abdominal:     General: Abdomen is protuberant. Bowel sounds are normal. There is no distension.     Palpations: Abdomen is soft. There is no mass.     Tenderness: There is no abdominal tenderness. There is no guarding or rebound.     Hernia: There is no hernia in the left inguinal area.  Genitourinary:    Penis: Normal.      Scrotum/Testes: Normal.        Right: Mass, tenderness or swelling not present. Right testis is descended. Cremasteric reflex is present.         Left: Mass, tenderness or swelling not present. Left testis is descended. Cremasteric reflex is present.   Musculoskeletal: Normal range of motion.        General: No tenderness or deformity.       Feet:  Feet:     Right  foot:     Skin integrity: Callus and dry skin present.     Toenail Condition: Right toenails are abnormally thick. Fungal disease present.    Left foot:     Skin integrity: Callus and dry skin present.     Toenail Condition: Left toenails are abnormally thick.  Fungal disease present. Lymphadenopathy:     Cervical: No cervical adenopathy.     Lower Body: No right inguinal adenopathy. No left inguinal adenopathy.  Skin:    General: Skin is warm and dry.     Capillary Refill: Capillary refill takes less than 2 seconds.     Findings: No erythema.  Neurological:     Mental Status: He is alert and oriented to person, place, and time.     Cranial Nerves: No cranial nerve deficit.     Motor: No abnormal muscle tone.     Coordination: Coordination normal.     Deep Tendon Reflexes: Reflexes normal.  Psychiatric:        Behavior: Behavior normal.        Thought Content: Thought content normal.        Judgment: Judgment normal.          Patient has been counseled extensively about nutrition and exercise as well as the importance of adherence with medications and regular follow-up. The patient was given clear instructions to go to ER or return to medical center if symptoms don't improve, worsen or new problems develop. The patient verbalized understanding.   Follow-up: No follow-ups on file.   Claiborne RiggZelda W Fleming, FNP-BC [facility] [place], [place] [phone]   11/07/2018, 9:36 PM

## 2018-11-06 NOTE — Progress Notes (Signed)
Patient presents for vaccination against influenza per orders of Zelda. Consent given. Counseling provided. No contraindications exists. Vaccine administered without incident.   

## 2018-11-06 NOTE — Patient Instructions (Signed)
 Gout  Gout is painful swelling of your joints. Gout is a type of arthritis. It is caused by having too much uric acid in your body. Uric acid is a chemical that is made when your body breaks down substances called purines. If your body has too much uric acid, sharp crystals can form and build up in your joints. This causes pain and swelling. Gout attacks can happen quickly and be very painful (acute gout). Over time, the attacks can affect more joints and happen more often (chronic gout). What are the causes?  Too much uric acid in your blood. This can happen because: ? Your kidneys do not remove enough uric acid from your blood. ? Your body makes too much uric acid. ? You eat too many foods that are high in purines. These foods include organ meats, some seafood, and beer.  Trauma or stress. What increases the risk?  Having a family history of gout.  Being male and middle-aged.  Being male and having gone through menopause.  Being very overweight (obese).  Drinking alcohol, especially beer.  Not having enough water in the body (being dehydrated).  Losing weight too quickly.  Having an organ transplant.  Having lead poisoning.  Taking certain medicines.  Having kidney disease.  Having a skin condition called psoriasis. What are the signs or symptoms? An attack of acute gout usually happens in just one joint. The most common place is the big toe. Attacks often start at night. Other joints that may be affected include joints of the feet, ankle, knee, fingers, wrist, or elbow. Symptoms of an attack may include:  Very bad pain.  Warmth.  Swelling.  Stiffness.  Shiny, red, or purple skin.  Tenderness. The affected joint may be very painful to touch.  Chills and fever. Chronic gout may cause symptoms more often. More joints may be involved. You may also have white or yellow lumps (tophi) on your hands or feet or in other areas near your joints. How is this  treated?  Treatment for this condition has two phases: treating an acute attack and preventing future attacks.  Acute gout treatment may include: ? NSAIDs. ? Steroids. These are taken by mouth or injected into a joint. ? Colchicine. This medicine relieves pain and swelling. It can be given by mouth or through an IV tube.  Preventive treatment may include: ? Taking small doses of NSAIDs or colchicine daily. ? Using a medicine that reduces uric acid levels in your blood. ? Making changes to your diet. You may need to see a food expert (dietitian) about what to eat and drink to prevent gout. Follow these instructions at home: During a gout attack   If told, put ice on the painful area: ? Put ice in a plastic bag. ? Place a towel between your skin and the bag. ? Leave the ice on for 20 minutes, 2-3 times a day.  Raise (elevate) the painful joint above the level of your heart as often as you can.  Rest the joint as much as possible. If the joint is in your leg, you may be given crutches.  Follow instructions from your doctor about what you cannot eat or drink. Avoiding future gout attacks  Eat a low-purine diet. Avoid foods and drinks such as: ? Liver. ? Kidney. ? Anchovies. ? Asparagus. ? Herring. ? Mushrooms. ? Mussels. ? Beer.  Stay at a healthy weight. If you want to lose weight, talk with your doctor. Do not lose   weight too fast.  Start or continue an exercise plan as told by your doctor. Eating and drinking  Drink enough fluids to keep your pee (urine) pale yellow.  If you drink alcohol: ? Limit how much you use to:  0-1 drink a day for women.  0-2 drinks a day for men. ? Be aware of how much alcohol is in your drink. In the [place] one drink equals one 12 oz bottle of beer (355 mL), one 5 oz glass of wine (148 mL), or one 1 oz glass of hard liquor (44 mL). General instructions  Take over-the-counter and prescription medicines only as told by your doctor.  Do  not drive or use heavy machinery while taking prescription pain medicine.  Return to your normal activities as told by your doctor. Ask your doctor what activities are safe for you.  Keep all follow-up visits as told by your doctor. This is important. Contact a doctor if:  You have another gout attack.  You still have symptoms of a gout attack after 10 days of treatment.  You have problems (side effects) because of your medicines.  You have chills or a fever.  You have burning pain when you pee (urinate).  You have pain in your lower back or belly. Get help right away if:  You have very bad pain.  Your pain cannot be controlled.  You cannot pee. Summary  Gout is painful swelling of the joints.  The most common site of pain is the big toe, but it can affect other joints.  Medicines and avoiding some foods can help to prevent and treat gout attacks. This information is not intended to replace advice given to you by your health care provider. Make sure you discuss any questions you have with your health care provider. Document Released: 11/18/2007 Document Revised: 08/31/2017 Document Reviewed: 08/31/2017 Elsevier Patient Education  2020 Elsevier Inc.   Low-Purine Eating Plan A low-purine eating plan involves making food choices to limit your intake of purine. Purine is a kind of uric acid. Too much uric acid in your blood can cause certain conditions, such as gout and kidney stones. Eating a low-purine diet can help control these conditions. What are tips for following this plan? Reading food labels   Avoid foods with saturated or Trans fat.  Check the ingredient list of grains-based foods, such as bread and cereal, to make sure that they contain whole grains.  Check the ingredient list of sauces or soups to make sure they do not contain meat or fish.  When choosing soft drinks, check the ingredient list to make sure they do not contain high-fructose corn syrup.  Shopping  Buy plenty of fresh fruits and vegetables.  Avoid buying canned or fresh fish.  Buy dairy products labeled as low-fat or nonfat.  Avoid buying premade or processed foods. These foods are often high in fat, salt (sodium), and added sugar. Cooking  Use olive oil instead of butter when cooking. Oils like olive oil, canola oil, and sunflower oil contain healthy fats. Meal planning  Learn which foods do or do not affect you. If you find out that a food tends to cause your gout symptoms to flare up, avoid eating that food. You can enjoy foods that do not cause problems. If you have any questions about a food item, talk with your dietitian or health care provider.  Limit foods high in fat, especially saturated fat. Fat makes it harder for your body to get rid of   uric acid.  Choose foods that are lower in fat and are lean sources of protein. General guidelines  Limit alcohol intake to no more than 1 drink a day for nonpregnant women and 2 drinks a day for men. One drink equals 12 oz of beer, 5 oz of wine, or 1 oz of hard liquor. Alcohol can affect the way your body gets rid of uric acid.  Drink plenty of water to keep your urine clear or pale yellow. Fluids can help remove uric acid from your body.  If directed by your health care provider, take a vitamin C supplement.  Work with your health care provider and dietitian to develop a plan to achieve or maintain a healthy weight. Losing weight can help reduce uric acid in your blood. What foods are recommended? The items listed may not be a complete list. Talk with your dietitian about what dietary choices are best for you. Foods low in purines Foods low in purines do not need to be limited. These include:  All fruits.  All low-purine vegetables, pickles, and olives.  Breads, pasta, rice, cornbread, and popcorn. Cake and other baked goods.  All dairy foods.  Eggs, nuts, and nut butters.  Spices and condiments, such as  salt, herbs, and vinegar.  Plant oils, butter, and margarine.  Water, sugar-free soft drinks, tea, coffee, and cocoa.  Vegetable-based soups, broths, sauces, and gravies. Foods moderate in purines Foods moderate in purines should be limited to the amounts listed.   cup of asparagus, cauliflower, spinach, mushrooms, or green peas, each day.  2/3 cup uncooked oatmeal, each day.   cup dry wheat bran or wheat germ, each day.  2-3 ounces of meat or poultry, each day.  4-6 ounces of shellfish, such as crab, lobster, oysters, or shrimp, each day.  1 cup cooked beans, peas, or lentils, each day.  Soup, broths, or bouillon made from meat or fish. Limit these foods as much as possible. What foods are not recommended? The items listed may not be a complete list. Talk with your dietitian about what dietary choices are best for you. Limit your intake of foods high in purines, including:  Beer and other alcohol.  Meat-based gravy or sauce.  Canned or fresh fish, such as: ? Anchovies, sardines, herring, and tuna. ? Mussels and scallops. ? Codfish, trout, and haddock.  Bacon.  Organ meats, such as: ? Liver or kidney. ? Tripe. ? Sweetbreads (thymus gland or pancreas).  Wild game or goose.  Yeast or yeast extract supplements.  Drinks sweetened with high-fructose corn syrup. Summary  Eating a low-purine diet can help control conditions caused by too much uric acid in the body, such as gout or kidney stones.  Choose low-purine foods, limit alcohol, and limit foods high in fat.  You will learn over time which foods do or do not affect you. If you find out that a food tends to cause your gout symptoms to flare up, avoid eating that food. This information is not intended to replace advice given to you by your health care provider. Make sure you discuss any questions you have with your health care provider. Document Released: 06/05/2010 Document Revised: 01/21/2017 Document  Reviewed: 03/24/2016 Elsevier Patient Education  2020 Elsevier Inc.   

## 2018-11-07 ENCOUNTER — Ambulatory Visit: Payer: Self-pay

## 2018-11-07 ENCOUNTER — Other Ambulatory Visit: Payer: Self-pay | Admitting: Nurse Practitioner

## 2018-11-07 ENCOUNTER — Encounter: Payer: Self-pay | Admitting: Nurse Practitioner

## 2018-11-07 LAB — URIC ACID: Uric Acid: 9.8 mg/dL — ABNORMAL HIGH (ref 3.7–8.6)

## 2018-11-07 MED ORDER — ALLOPURINOL 100 MG PO TABS: 100.0000 mg | ORAL_TABLET | Freq: Every day | ORAL | 2 refills | Status: DC

## 2018-11-07 MED ORDER — PREDNISONE 10 MG PO TABS: 30.0000 mg | ORAL_TABLET | Freq: Every day | ORAL | 0 refills | Status: AC

## 2018-11-08 MED FILL — predniSONE 10 MG TABS: 10 | 7 days supply | Qty: 21 | Fill #0

## 2018-11-08 MED FILL — ALLOPURINOL 100 MG TABLET: 100 | 30 days supply | Qty: 30 | Fill #0

## 2018-11-16 ENCOUNTER — Other Ambulatory Visit: Payer: Self-pay

## 2018-11-16 ENCOUNTER — Ambulatory Visit: Payer: Self-pay | Attending: Family Medicine

## 2018-11-24 ENCOUNTER — Encounter: Payer: Self-pay | Admitting: Nurse Practitioner

## 2018-11-24 ENCOUNTER — Telehealth: Payer: Self-pay | Admitting: Nurse Practitioner

## 2018-11-24 DIAGNOSIS — N3000 Acute cystitis without hematuria: Secondary | ICD-10-CM

## 2018-11-24 NOTE — Progress Notes (Signed)

## 2018-11-27 ENCOUNTER — Telehealth: Payer: Self-pay | Admitting: Nurse Practitioner

## 2018-11-27 ENCOUNTER — Telehealth: Payer: Self-pay | Admitting: *Deleted

## 2018-11-27 DIAGNOSIS — Z202 Contact with and (suspected) exposure to infections with a predominantly sexual mode of transmission: Secondary | ICD-10-CM

## 2018-11-27 MED ORDER — SULFAMETHOXAZOLE-TRIMETHOPRIM 800-160 MG PO TABS: 1.0000 | ORAL_TABLET | Freq: Two times a day (BID) | ORAL | 0 refills | Status: DC

## 2018-11-27 MED ORDER — METRONIDAZOLE 500 MG PO TABS: 2000.0000 mg | ORAL_TABLET | Freq: Three times a day (TID) | ORAL | 0 refills | Status: DC

## 2018-11-27 MED FILL — ?SULFAMETHOXAZOLE-TMP DS TB: 800-160 | 7 days supply | Qty: 14 | Fill #0

## 2018-11-27 NOTE — Telephone Encounter (Signed)
Patient called [facility] triage. Landis Gandy, RN

## 2018-11-27 NOTE — Telephone Encounter (Signed)
Per Tommy Medal called the patient to advise of Rx sent to [facility]. And to not drink alcohol while taking the medication. Had to leave a message for him to call with questions as he did not answer his phone.

## 2018-11-27 NOTE — Telephone Encounter (Signed)
Patient called to report that his partner has tested positive for trichomonas and he wants to know if we can send in treatment for him. Advised she is being treated by her GYN. Advised him will ask his provider and give him a call back. He was upset but explained it is a common STD ans should not cause him any extra problems. He advised he uses [facility] as his pharmacy.

## 2018-11-27 NOTE — Addendum Note (Signed)
Addended by: Reggy Eye on: 11/27/2018 04:59 PM   Modules accepted: Orders

## 2018-11-27 NOTE — Addendum Note (Signed)
Addended by: Chevis Pretty on: 11/27/2018 12:13 PM   Modules accepted: Orders

## 2018-11-27 NOTE — Telephone Encounter (Signed)
It is also something that can be present for a long time 2 grams oral metronidazole for him x 1

## 2018-11-28 MED FILL — ?METRONIDAZOLE 500 MG TABS: 500 | 1 days supply | Qty: 4 | Fill #0

## 2018-11-29 ENCOUNTER — Ambulatory Visit: Payer: Self-pay

## 2018-11-29 NOTE — Telephone Encounter (Signed)
Robert Stout walked in the clinic today with questions about medications. He states since he was not contacted soon with response on treatment for Trichomonas by our office, he then opted to do an E-visit and was prescribed Bactrim  800/160 mg 1 tablet two times daily which he started yesterday 11/28/2018.   Confused he asked CMA if he should take the  Metronidazole prescribed by Dr. Tommy Medal or take the Bactrim. CMA advised Josuha to stop the Bactrim completely and take the Metronidazole instead and to not consume any alcohol in the next 48 hours after the one time dose.   Lofton confirmed understanding with feedback, has no further questions or concerns.   Lenore Cordia, [place]

## 2018-12-11 MED FILL — ALLOPURINOL 100 MG TABLET: 100 | 30 days supply | Qty: 30 | Fill #1

## 2019-01-01 ENCOUNTER — Ambulatory Visit: Payer: Self-pay | Attending: Nurse Practitioner | Admitting: Nurse Practitioner

## 2019-01-01 ENCOUNTER — Encounter: Payer: Self-pay | Admitting: Nurse Practitioner

## 2019-01-01 ENCOUNTER — Other Ambulatory Visit: Payer: Self-pay

## 2019-01-01 DIAGNOSIS — M109 Gout, unspecified: Secondary | ICD-10-CM

## 2019-01-01 DIAGNOSIS — R7989 Other specified abnormal findings of blood chemistry: Secondary | ICD-10-CM

## 2019-01-01 NOTE — Progress Notes (Signed)
Assessment & Plan:  Diagnoses and all orders for this visit:  Gouty arthritis of right great toe -     Uric Acid; Future  Elevated serum creatinine -     CMP14+EGFR; Future Avoid NSAIDs, ASA DASH DIET    Patient has been counseled on age-appropriate routine health concerns for screening and prevention. These are reviewed and up-to-date. Referrals have been placed accordingly. Immunizations are up-to-date or declined.    Subjective:  No chief complaint on file.  HPI Robert Stout 43 y.o. male presents to office today for follow up.  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   Gout Currently taking allopurinol. Was wondering how long he would need to stay on it. At this time I instructed him it would be considered a daily maintenance medication. He verbalized understanding. He is currently taking allopurinol 100 mg daily. He denies any acute gout attacks since last clinic visit.  The patient trying to  avoid high purine foods.  Lab Results  Component Value Date   LABURIC 9.8 (H) 11/06/2018     Review of Systems  Constitutional: Negative for fever, malaise/fatigue and weight loss.  HENT: Negative.  Negative for nosebleeds.   Eyes: Negative.  Negative for blurred vision, double vision and photophobia.  Respiratory: Negative.  Negative for cough and shortness of breath.   Cardiovascular: Negative.  Negative for chest pain, palpitations and leg swelling.  Gastrointestinal: Negative.  Negative for heartburn, nausea and vomiting.  Musculoskeletal: Negative.  Negative for myalgias.  Neurological: Negative.  Negative for dizziness, focal weakness, seizures and headaches.  Psychiatric/Behavioral: Negative.  Negative for suicidal ideas.    Past Medical History:  Diagnosis  Date  . Caries 01/03/2017  . Genital warts 10/28/2016  . 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 HIV infection ([place]) Dx 2011  . Rectal bleeding 10/09/2018  . Weight gain 10/09/2018    Past Surgical History:  Procedure Laterality Date  . TENDON REPAIR      Family History  Problem Relation Age of Onset  . Diabetes Mother   . Hypertension Mother     Social History Reviewed with no changes to be made today.   Outpatient Medications Prior to Visit  Medication Sig Dispense Refill  . albuterol (PROVENTIL HFA;VENTOLIN HFA) 108 (90 Base) MCG/ACT inhaler Inhale 2 puffs into the lungs every 6 (six) hours as needed for wheezing or shortness of breath. 1 Inhaler 8  . allopurinol (ZYLOPRIM) 100 MG tablet Take 1 tablet (100 mg total) by mouth daily. 90 tablet 2  . BIKTARVY 50-200-25 MG TABS tablet TAKE 1 TABLET BY MOUTH DAILY 30 tablet 4  . imiquimod (ALDARA) 5 % cream Apply topically 3 (three) times a week. 12 each 3  . mometasone-formoterol (DULERA) 100-5 MCG/ACT AERO Inhale 2 puffs into the lungs 2 (two) times daily. 1 Inhaler 11  . valACYclovir (VALTREX) 500 MG tablet Take 1 tablet (500 mg total) by mouth 2 (two) times daily. 1 TABLET DAILY 14 tablet 0  . metroNIDAZOLE (FLAGYL) 500 MG tablet Take 4 tablets (2,000 mg total) by mouth 3 (three) times daily. (Patient not taking: Reported on 01/01/2019) 4 tablet 0  . sulfamethoxazole-trimethoprim (BACTRIM DS) 800-160 MG tablet Take 1 tablet by mouth 2 (two) times daily. (Patient not taking: Reported  on 01/01/2019) 14 tablet 0   No facility-administered medications prior to visit.     No Known Allergies          Patient has been counseled extensively about nutrition and exercise as well as the importance of adherence with medications and regular follow-up. The patient was given clear instructions to go to ER or return to medical center if symptoms don't improve, worsen or new problems develop.  The patient verbalized understanding.   Total time spent with patient was 13 min. Greater than 50 % of this visit was spent face to face counseling and coordinating care regarding risk factor modification, compliance importance and encouragement, education related to GOUT  Follow-up: Return in about 3 months (around 04/03/2019).   Gildardo Pounds, FNP-BC [facility] [place], [place]   01/01/2019, 3:34 PM

## 2019-01-01 NOTE — Progress Notes (Signed)
Per pt he would like to know how long will he have to be on his Uric Acid medication

## 2019-01-10 MED FILL — ALLOPURINOL 100 MG TABLET: 100 | 30 days supply | Qty: 30 | Fill #2

## 2019-02-13 MED FILL — ALLOPURINOL 100 MG TABLET: 100 | 30 days supply | Qty: 30 | Fill #3

## 2019-02-20 ENCOUNTER — Telehealth: Payer: Self-pay | Admitting: Pharmacy Technician

## 2019-02-20 NOTE — Telephone Encounter (Signed)
Covered by [employer] for the December refill.

## 2019-02-20 NOTE — Telephone Encounter (Signed)
[facility] Patient Advocate Encounter   Patient has been approved for [employer] Advancing Access Patient Assistance Program for [place] 30-day coverage. This assistance will make the patient's copay $0.  I have spoken with the patient and they will pick up today at [facility]. This will help prevent the 13 fill rejection next December.  The billing information is:  Member ID: [id number] [place]: [id number] PCN: [id number] Group: [id number]  Patient knows to call the office with questions or concerns.  Bartholomew Crews, [place] Patient [facility] for Infectious Disease Phone: [phone] Fax: [phone] 02/20/2019 3:14 PM

## 2019-02-21 MED FILL — ALBUTEROL SULFATE HFA 108 (: 108 (90 BAS | 25 days supply | Qty: 18 | Fill #1

## 2019-02-27 NOTE — Telephone Encounter (Signed)
[facility] Patient Advocate Encounter   Patient has been approved for [employer] Advancing Access Patient Assistance Program for [place]  from 02/20/2019 to 02/20/2020. This assistance will make the patient's copay $0.  The billing information is  Member ID: [id number] RxBin: [id number] PCN: [id number] Group: [id number]  Patient knows to call the office with questions or concerns.  Beulah Gandy, CPhT Specialty Pharmacy Patient [facility] for Infectious Disease Phone: [phone] Fax: [phone] 02/27/2019 3:04 PM

## 2019-02-28 ENCOUNTER — Other Ambulatory Visit: Payer: Self-pay | Admitting: Infectious Disease

## 2019-02-28 DIAGNOSIS — J209 Acute bronchitis, unspecified: Secondary | ICD-10-CM

## 2019-02-28 DIAGNOSIS — J4520 Mild intermittent asthma, uncomplicated: Secondary | ICD-10-CM

## 2019-02-28 MED ORDER — ALBUTEROL SULFATE HFA 108 (90 BASE) MCG/ACT IN AERS: 2.0000 | INHALATION_SPRAY | Freq: Four times a day (QID) | RESPIRATORY_TRACT | 5 refills | Status: DC | PRN

## 2019-02-28 MED ORDER — DULERA 100-5 MCG/ACT IN AERO: 2.0000 | INHALATION_SPRAY | Freq: Two times a day (BID) | RESPIRATORY_TRACT | 11 refills | Status: DC

## 2019-02-28 MED FILL — CHLORHEXIDINE 0.12% RINSE: 0.12 | 16 days supply | Qty: 473 | Fill #0

## 2019-02-28 MED FILL — SF 5000 PLUS CREAM: 1.1 | 30 days supply | Qty: 51 | Fill #0

## 2019-03-01 NOTE — Telephone Encounter (Signed)
[facility] Patient Advocate Encounter  Patient came in the clinic yesterday to recertify and asked [place] to submit a new application for his inhalers, Ventolin and Dulera.   Ventolin has been submitted to [employer] Patient Assistance Program and Elwin Sleight has been submitted to [employer]. Once approved, the company will send 3 months directly to the patient.   [employer]K [phone] [employer] [phone]

## 2019-03-09 ENCOUNTER — Other Ambulatory Visit: Payer: Self-pay | Admitting: Infectious Disease

## 2019-03-14 ENCOUNTER — Other Ambulatory Visit: Payer: Self-pay | Admitting: Infectious Disease

## 2019-03-14 DIAGNOSIS — B20 Human immunodeficiency virus [HIV] disease: Secondary | ICD-10-CM

## 2019-03-15 NOTE — Telephone Encounter (Signed)
[facility] Patient Advocate Encounter  Found out [employer] requires that the application and script be sent via [place] mail instead of fax.   [employer] Patient Assistance Program [address] [place], [place] [address]

## 2019-03-16 MED FILL — CHLORHEXIDINE 0.12% RINSE: 0.12 | 16 days supply | Qty: 473 | Fill #0

## 2019-03-16 MED FILL — ALLOPURINOL 100 MG TABLET: 100 | 30 days supply | Qty: 30 | Fill #4

## 2019-03-16 MED FILL — SF 5000 PLUS CREAM: 1.1 | 30 days supply | Qty: 51 | Fill #0

## 2019-03-19 ENCOUNTER — Other Ambulatory Visit: Payer: Self-pay

## 2019-03-19 ENCOUNTER — Ambulatory Visit: Payer: Self-pay

## 2019-03-19 DIAGNOSIS — R635 Abnormal weight gain: Secondary | ICD-10-CM

## 2019-03-19 DIAGNOSIS — Z113 Encounter for screening for infections with a predominantly sexual mode of transmission: Secondary | ICD-10-CM

## 2019-03-19 DIAGNOSIS — B20 Human immunodeficiency virus [HIV] disease: Secondary | ICD-10-CM

## 2019-03-19 DIAGNOSIS — Z79899 Other long term (current) drug therapy: Secondary | ICD-10-CM

## 2019-03-20 LAB — T-HELPER CELL (CD4) - (RCID CLINIC ONLY)
CD4 % Helper T Cell: 30 % — ABNORMAL LOW (ref 33–65)
CD4 T Cell Abs: 911 /uL (ref 400–1790)

## 2019-03-22 LAB — COMPLETE METABOLIC PANEL WITH GFR
AG Ratio: 1.3 (calc) (ref 1.0–2.5)
ALT: 20 U/L (ref 9–46)
AST: 25 U/L (ref 10–40)
Albumin: 4.3 g/dL (ref 3.6–5.1)
Alkaline phosphatase (APISO): 61 U/L (ref 36–130)
BUN: 10 mg/dL (ref 7–25)
CO2: 27 mmol/L (ref 20–32)
Calcium: 9.5 mg/dL (ref 8.6–10.3)
Chloride: 102 mmol/L (ref 98–110)
Creat: 1.22 mg/dL (ref 0.60–1.35)
GFR, Est African American: 82 mL/min/{1.73_m2} (ref >=60)
GFR, Est Non African American: 71 mL/min/{1.73_m2} (ref >=60)
Globulin: 3.2 g/dL (calc) (ref 1.9–3.7)
Glucose, Bld: 92 mg/dL (ref 65–99)
Potassium: 4.2 mmol/L (ref 3.5–5.3)
Sodium: 137 mmol/L (ref 135–146)
Total Bilirubin: 0.4 mg/dL (ref 0.2–1.2)
Total Protein: 7.5 g/dL (ref 6.1–8.1)

## 2019-03-22 LAB — CBC WITH DIFFERENTIAL/PLATELET
Absolute Monocytes: 608 cells/uL (ref 200–950)
Basophils Absolute: 62 cells/uL (ref 0–200)
Basophils Relative: 0.8 %
Eosinophils Absolute: 231 cells/uL (ref 15–500)
Eosinophils Relative: 3 %
HCT: 41.1 % (ref 38.5–50.0)
Hemoglobin: 14 g/dL (ref 13.2–17.1)
Lymphs Abs: 3311 cells/uL (ref 850–3900)
MCH: 31.3 pg (ref 27.0–33.0)
MCHC: 34.1 g/dL (ref 32.0–36.0)
MCV: 91.7 fL (ref 80.0–100.0)
MPV: 9.9 fL (ref 7.5–12.5)
Monocytes Relative: 7.9 %
Neutro Abs: 3488 cells/uL (ref 1500–7800)
Neutrophils Relative %: 45.3 %
Platelets: 303 10*3/uL (ref 140–400)
RBC: 4.48 10*6/uL (ref 4.20–5.80)
RDW: 12.7 % (ref 11.0–15.0)
Total Lymphocyte: 43 %
WBC: 7.7 10*3/uL (ref 3.8–10.8)

## 2019-03-22 LAB — LIPID PANEL
Cholesterol: 188 mg/dL (ref <200)
HDL: 36 mg/dL — ABNORMAL LOW (ref >=40)
LDL Cholesterol (Calc): 116 mg/dL (calc) — ABNORMAL HIGH
Non-HDL Cholesterol (Calc): 152 mg/dL (calc) — ABNORMAL HIGH (ref <130)
Total CHOL/HDL Ratio: 5.2 (calc) — ABNORMAL HIGH (ref <5.0)
Triglycerides: 236 mg/dL — ABNORMAL HIGH (ref <150)

## 2019-03-22 LAB — HIV-1 RNA QUANT-NO REFLEX-BLD
HIV 1 RNA Quant: 45 copies/mL — ABNORMAL HIGH
HIV-1 RNA Quant, Log: 1.65 Log copies/mL — ABNORMAL HIGH

## 2019-03-22 LAB — RPR: RPR Ser Ql: NONREACTIVE

## 2019-04-09 ENCOUNTER — Encounter: Payer: Self-pay | Admitting: Infectious Disease

## 2019-04-12 MED FILL — ?ALLOPURINOL 100MG TABLET: 100 | 30 days supply | Qty: 30 | Fill #5

## 2019-04-23 ENCOUNTER — Ambulatory Visit (INDEPENDENT_AMBULATORY_CARE_PROVIDER_SITE_OTHER): Payer: Self-pay | Admitting: Infectious Disease

## 2019-04-23 ENCOUNTER — Encounter: Payer: Self-pay | Admitting: Infectious Disease

## 2019-04-23 ENCOUNTER — Other Ambulatory Visit: Payer: Self-pay

## 2019-04-23 VITALS — BP 122/82 | HR 80 | Temp 97.5°F | Ht 70.0 in | Wt 267.0 lb

## 2019-04-23 DIAGNOSIS — B20 Human immunodeficiency virus [HIV] disease: Secondary | ICD-10-CM

## 2019-04-23 DIAGNOSIS — B009 Herpesviral infection, unspecified: Secondary | ICD-10-CM

## 2019-04-23 DIAGNOSIS — A630 Anogenital (venereal) warts: Secondary | ICD-10-CM

## 2019-04-23 DIAGNOSIS — R635 Abnormal weight gain: Secondary | ICD-10-CM

## 2019-04-23 NOTE — Progress Notes (Signed)
Subjective:   Chief complaints: Still suffering from genital warts that have not responded to Aldara  Patient ID: Robert Stout, male    DOB: 1975/08/25, 44 y.o.   MRN: [id number]  HPI  Robert Stout is a 44 year-old African-American man living with HIV that is currently well controlled.  He did have some trouble with homelessness   When I last saw him he had what appeared to be genital warts on the shaft of his penis.  I prescribed him Aldara but this has had no effect on these lesions whatsoever.  I think he will need to be seen by dermatology or urology but currently does not have insurance still.  His HIV still remains very nicely controlled on Biktarvy.  He is also getting albuterol and Dulera through pharmaceutical assistance.  I have encouraged him to use the H map program to purchase an ACA plan.      Past Medical History:  Diagnosis Date  . Caries 01/03/2017  . Genital warts 10/28/2016  . 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 HIV infection (H[facility]) Dx 2011  . Rectal bleeding 10/09/2018  . Weight gain 10/09/2018    Past Surgical History:  Procedure Laterality Date  . TENDON REPAIR      Family History  Problem Relation Age of Onset  . Diabetes Mother   . Hypertension Mother       Social History   Socioeconomic History  . Marital status: Single    Spouse name: Not on file  . Number of children: Not on file  . Years of education: Not on file  . Highest education level: Not on file  Occupational History  . Not on file  Tobacco Use  . Smoking status: Never Smoker  . Smokeless tobacco: Never Used  Substance and Sexual Activity  . Alcohol use: No    Alcohol/week: 0.0 standard drinks  . Drug use: No  . Sexual activity: Never  Other Topics Concern  . Not on file  Social History Narrative  . Not on file   Social Determinants of Health   Financial Resource Strain:   . Difficulty of Paying Living Expenses: Not on  file  Food Insecurity:   . Worried About Programme researcher, broadcasting/film/video in the Last Year: Not on file  . Ran Out of Food in the Last Year: Not on file  Transportation Needs:   . Lack of Transportation (Medical): Not on file  . Lack of Transportation (Non-Medical): Not on file  Physical Activity:   . Days of Exercise per Week: Not on file  . Minutes of Exercise per Session: Not on file  Stress:   . Feeling of Stress : Not on file  Social Connections:   . Frequency of Communication with Friends and Family: Not on file  . Frequency of Social Gatherings with Friends and Family: Not on file  . Attends Religious Services: Not on file  . Active Member of Clubs or Organizations: Not on file  . Attends Banker Meetings: Not on file  . Marital Status: Not on file    No Known Allergies   Current Outpatient Medications:  .  albuterol (VENTOLIN HFA) 108 (90 Base) MCG/ACT inhaler, Inhale 2 puffs into the lungs every 6 (six) hours as needed for wheezing or shortness of breath., Disp: 18 g, Rfl: 5 .  allopurinol (ZYLOPRIM) 100 MG tablet, Take 1 tablet (100 mg total) by mouth daily., Disp:  90 tablet, Rfl: 2 .  BIKTARVY 50-200-25 MG TABS tablet, TAKE 1 TABLET BY MOUTH DAILY, Disp: 30 tablet, Rfl: 4 .  imiquimod (ALDARA) 5 % cream, APPLY CONTENTS OF 1 PACKET TOPICALLY TO THE AFFECTED AREA THREE TIMES PER WEEK, Disp: 12 each, Rfl: 3 .  metroNIDAZOLE (FLAGYL) 500 MG tablet, Take 4 tablets (2,000 mg total) by mouth 3 (three) times daily. (Patient not taking: Reported on 01/01/2019), Disp: 4 tablet, Rfl: 0 .  mometasone-formoterol (DULERA) 100-5 MCG/ACT AERO, Inhale 2 puffs into the lungs 2 (two) times daily., Disp: 13 g, Rfl: 11 .  sulfamethoxazole-trimethoprim (BACTRIM DS) 800-160 MG tablet, Take 1 tablet by mouth 2 (two) times daily. (Patient not taking: Reported on 01/01/2019), Disp: 14 tablet, Rfl: 0 .  valACYclovir (VALTREX) 500 MG tablet, Take 1 tablet (500 mg total) by mouth 2 (two) times daily. 1  TABLET DAILY, Disp: 14 tablet, Rfl: 0    Review of Systems  Constitutional: Negative for chills and fever.  HENT: Negative for congestion, dental problem and sore throat.   Eyes: Negative for photophobia.  Respiratory: Negative for cough, shortness of breath and wheezing.   Cardiovascular: Negative for chest pain, palpitations and leg swelling.  Gastrointestinal: Negative for abdominal pain, blood in stool, constipation, diarrhea, nausea and vomiting.  Genitourinary: Negative for dysuria, flank pain and hematuria.  Musculoskeletal: Negative for arthralgias, back pain and myalgias.  Skin: Negative for rash.  Neurological: Negative for dizziness, weakness and headaches.  Hematological: Does not bruise/bleed easily.  Psychiatric/Behavioral: Negative for agitation, behavioral problems and suicidal ideas.       Objective:   Physical Exam Vitals and nursing note reviewed. Exam conducted with a chaperone present.  Constitutional:      General: He is not in acute distress.    Appearance: He is well-developed. He is not diaphoretic.  HENT:     Head: Normocephalic and atraumatic.     Mouth/Throat:     Dentition: Dental caries present.     Pharynx: No oropharyngeal exudate.  Eyes:     General: No scleral icterus.    Conjunctiva/sclera: Conjunctivae normal.  Cardiovascular:     Rate and Rhythm: Normal rate and regular rhythm.     Heart sounds: No friction rub.  Pulmonary:     Effort: Pulmonary effort is normal. No respiratory distress.     Breath sounds: No wheezing or rales.  Abdominal:     General: There is no distension.     Palpations: Abdomen is soft.  Genitourinary:    Rectum: Guaiac result positive. Tenderness present.      Musculoskeletal:        General: No tenderness.     Cervical back: Normal range of motion and neck supple.  Skin:    General: Skin is warm and dry.     Coloration: Skin is not pale.     Findings: No erythema or rash.  Neurological:     Mental  Status: He is alert and oriented to person, place, and time.     Motor: No abnormal muscle tone.     Coordination: Coordination normal.  Psychiatric:        Attention and Perception: Attention and perception normal.        Mood and Affect: Mood is depressed.        Behavior: Behavior normal.        Thought Content: Thought content normal.        Judgment: Judgment normal.  Assessment & Plan:   HIV and AIDS: Continue BIKTARVY,. HMAP renewed and RTC in July   Asthma: Continue meds including Dulera through [employer] assistance  Weight gain: consider ACTG study potentially if he would be eligible  Penile lesions: these appear to be genital warts but not responding to Aldara. He needs to see Derm and or Urology Anal pap was normal.

## 2019-04-23 NOTE — Patient Instructions (Signed)
Make appt with Roe Coombs to find out how to use HMAP program to obtain ACA ("Obamacare") plan

## 2019-05-11 MED FILL — SF 5000 PLUS CREAM: 1.1 | 30 days supply | Qty: 51 | Fill #1

## 2019-05-11 MED FILL — ?ALLOPURINOL 100MG TABLET: 100 | 30 days supply | Qty: 30 | Fill #6

## 2019-05-17 ENCOUNTER — Ambulatory Visit: Payer: Self-pay | Attending: Nurse Practitioner | Admitting: Physician Assistant

## 2019-05-17 ENCOUNTER — Other Ambulatory Visit: Payer: Self-pay

## 2019-05-17 DIAGNOSIS — J302 Other seasonal allergic rhinitis: Secondary | ICD-10-CM

## 2019-05-17 MED ORDER — LORATADINE 10 MG PO TABS: 10.0000 mg | ORAL_TABLET | Freq: Every day | ORAL | 11 refills | Status: DC

## 2019-05-17 NOTE — Progress Notes (Signed)
Established Patient Office Visit  Subjective:  Patient ID: Robert Stout, male    DOB: January 31, 1976  Age: 44 y.o. MRN: [id number]  CC:  Chief Complaint  Patient presents with  . Covid Vaccine     symptoms    HPI Robert Stout reports that he had his first COVID-19 vaccine on Friday May 11, 2019.  Reports that he did develop a fever, states that that lasted a couple of days but has since resolved.  He reports that he started having a dry cough soon after as well.  Reports he has not been around anyone else who is sick, cough is not keeping him awake at night, no other cold symptoms.  Has tried NyQuil cold tablets and cough drops without much relief.  Does endorse that he has seasonal allergies and feels that they are out of control right now.  Does not take anything on a daily basis for this.  Virtual Visit via Telephone Note  I connected with Robert Stout on 05/18/19 at 11:00 AM EDT by telephone and verified that I am speaking with the correct person using two identifiers.  Location: Patient: home Provider: [facility]   I discussed the limitations, risks, security and privacy concerns of performing an evaluation and management service by telephone and the availability of in person appointments. I also discussed with the patient that there may be a patient responsible charge related to this service. The patient expressed understanding and agreed to proceed.    Observations/Objective: Patient chart reviewed, no physical exam completed    Past Medical History:  Diagnosis Date  . Caries 01/03/2017  . Genital warts 10/28/2016  . 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 HIV infection ([place]) Dx 2011  . Rectal bleeding 10/09/2018  . Weight gain 10/09/2018    Past Surgical History:  Procedure Laterality Date  . TENDON REPAIR      Family History  Problem Relation Age of Onset  . Diabetes Mother   . Hypertension  Mother     Social History   Socioeconomic History  . Marital status: Single    Spouse name: Not on file  . Number of children: Not on file  . Years of education: Not on file  . Highest education level: Not on file  Occupational History  . Not on file  Tobacco Use  . Smoking status: Never Smoker  . Smokeless tobacco: Never Used  Substance and Sexual Activity  . Alcohol use: No    Alcohol/week: 0.0 standard drinks  . Drug use: No  . Sexual activity: Never  Other Topics Concern  . Not on file  Social History Narrative  . Not on file   Social Determinants of Health   Financial Resource Strain:   . Difficulty of Paying Living Expenses:   Food Insecurity:   . Worried About Charity fundraiser in the Last Year:   . Arboriculturist in the Last Year:   Transportation Needs:   . Film/video editor (Medical):   Marland Kitchen Lack of Transportation (Non-Medical):   Physical Activity:   . Days of Exercise per Week:   . Minutes of Exercise per Session:   Stress:   . Feeling of Stress :   Social Connections:   . Frequency of Communication with Friends and Family:   . Frequency of Social Gatherings with Friends and Family:   . Attends Religious Services:   . Active  Member of Clubs or Organizations:   . Attends Banker Meetings:   Marland Kitchen Marital Status:   Intimate Partner Violence:   . Fear of Current or Ex-Partner:   . Emotionally Abused:   Marland Kitchen Physically Abused:   . Sexually Abused:     Outpatient Medications Prior to Visit  Medication Sig Dispense Refill  . albuterol (VENTOLIN HFA) 108 (90 Base) MCG/ACT inhaler Inhale 2 puffs into the lungs every 6 (six) hours as needed for wheezing or shortness of breath. 18 g 5  . allopurinol (ZYLOPRIM) 100 MG tablet Take 1 tablet (100 mg total) by mouth daily. 90 tablet 2  . BIKTARVY 50-200-25 MG TABS tablet TAKE 1 TABLET BY MOUTH DAILY 30 tablet 4  . imiquimod (ALDARA) 5 % cream APPLY CONTENTS OF 1 PACKET TOPICALLY TO THE AFFECTED AREA  THREE TIMES PER WEEK 12 each 3  . mometasone-formoterol (DULERA) 100-5 MCG/ACT AERO Inhale 2 puffs into the lungs 2 (two) times daily. 13 g 11  . valACYclovir (VALTREX) 500 MG tablet Take 1 tablet (500 mg total) by mouth 2 (two) times daily. 1 TABLET DAILY (Patient not taking: Reported on 04/23/2019) 14 tablet 0   No facility-administered medications prior to visit.    No Known Allergies  ROS Review of Systems  Constitutional: Negative for chills, fatigue and fever.  HENT: Negative for congestion, ear pain, facial swelling, postnasal drip, sinus pressure, sinus pain, sneezing, sore throat and trouble swallowing.   Eyes: Negative for discharge and itching.  Respiratory: Negative for chest tightness and shortness of breath.   Gastrointestinal: Negative.   Musculoskeletal: Negative.   Skin: Negative.   Allergic/Immunologic: Positive for environmental allergies.  Neurological: Negative.       Objective:    There were no vitals taken for this visit. Wt Readings from Last 3 Encounters:  04/23/19 267 lb (121.1 kg)  11/06/18 269 lb (122 kg)  10/09/18 267 lb (121.1 kg)     There are no preventive care reminders to display for this patient.  There are no preventive care reminders to display for this patient.  Lab Results  Component Value Date   TSH 1.670 09/20/2016   Lab Results  Component Value Date   WBC 7.7 03/19/2019   HGB 14.0 03/19/2019   HCT 41.1 03/19/2019   MCV 91.7 03/19/2019   PLT 303 03/19/2019   Lab Results  Component Value Date   NA 137 03/19/2019   K 4.2 03/19/2019   CO2 27 03/19/2019   GLUCOSE 92 03/19/2019   BUN 10 03/19/2019   CREATININE 1.22 03/19/2019   BILITOT 0.4 03/19/2019   ALKPHOS 68 09/20/2016   AST 25 03/19/2019   ALT 20 03/19/2019   PROT 7.5 03/19/2019   ALBUMIN 4.1 09/20/2016   CALCIUM 9.5 03/19/2019   ANIONGAP 9 06/04/2016   Lab Results  Component Value Date   CHOL 188 03/19/2019   Lab Results  Component Value Date   HDL 36 (L)  03/19/2019   Lab Results  Component Value Date   LDLCALC 116 (H) 03/19/2019   Lab Results  Component Value Date   TRIG 236 (H) 03/19/2019   Lab Results  Component Value Date   CHOLHDL 5.2 (H) 03/19/2019   Lab Results  Component Value Date   HGBA1C 5.9 (A) 11/06/2018      Assessment & Plan:   Problem List Items Addressed This Visit    None    Visit Diagnoses    Seasonal allergies    -  Primary   Relevant Medications   loratadine (CLARITIN) 10 MG tablet     Assessment and Plan:  1. Seasonal allergies  Patient endorses allergies are flaring up at this time, doubtful this is linked to recent vaccination. Encouraged patient to take Claritin on a daily basis, avoid allergens, follow-up if cough is not improved in the next 4 to 5 days.  - loratadine (CLARITIN) 10 MG tablet; Take 1 tablet (10 mg total) by mouth daily.  Dispense: 30 tablet; Refill: 11  Follow Up Instructions:    I discussed the assessment and treatment plan with the patient. The patient was provided an opportunity to ask questions and all were answered. The patient agreed with the plan and demonstrated an understanding of the instructions.   The patient was advised to call back or seek an in-person evaluation if the symptoms worsen or if the condition fails to improve as anticipated.  I provided 20 minutes of non-face-to-face time during this encounter.   Aloysuis Ribaudo S Mayers, PA-C    Meds ordered this encounter  Medications  . loratadine (CLARITIN) 10 MG tablet    Sig: Take 1 tablet (10 mg total) by mouth daily.    Dispense:  30 tablet    Refill:  11    Order Specific Question:   Supervising Provider    Answer:   Storm Frisk [[id number]]    Follow-up: Return if symptoms worsen or fail to improve.    Kasandra Knudsen Mayers, PA-C

## 2019-05-17 NOTE — Progress Notes (Signed)
Patient verified DOB Patient has taken medication today. Patient has eaten today. Patient denies pain. Patient received vaccine 1st dose last Friday. Patient complains of 99.6 for 2 days and tylenol relieved. Cough presented on Sunday, cough has lingered. Patient complains of tickling and itching in the throat. Patient denies N/V, Diarrhea  Patient is scheduled for second dose 4/9 @ 2:30.

## 2019-05-18 NOTE — Patient Instructions (Signed)
Please use the claritin on a daily basis until your cough resolves.  Avoid allergens if possible.    If your cough does not improve by the beginning of next week, please schedule follow up appointment   Cough, Adult Coughing is a reflex that clears your throat and your airways (respiratory system). Coughing helps to heal and protect your lungs. It is normal to cough occasionally, but a cough that happens with other symptoms or lasts a long time may be a sign of a condition that needs treatment. An acute cough may only last 2-3 weeks, while a chronic cough may last 8 or more weeks. Coughing is commonly caused by:  Infection of the respiratory systemby viruses or bacteria.  Breathing in substances that irritate your lungs.  Allergies.  Asthma.  Mucus that runs down the back of your throat (postnasal drip).  Smoking.  Acid backing up from the stomach into the esophagus (gastroesophageal reflux).  Certain medicines.  Chronic lung problems.  Other medical conditions such as heart failure or a blood clot in the lung (pulmonary embolism). Follow these instructions at home: Medicines  Take over-the-counter and prescription medicines only as told by your health care provider.  Talk with your health care provider before you take a cough suppressant medicine. Lifestyle   Avoid cigarette smoke. Do not use any products that contain nicotine or tobacco, such as cigarettes, e-cigarettes, and chewing tobacco. If you need help quitting, ask your health care provider.  Drink enough fluid to keep your urine pale yellow.  Avoid caffeine.  Do not drink alcohol if your health care provider tells you not to drink. General instructions   Pay close attention to changes in your cough. Tell your health care provider about them.  Always cover your mouth when you cough.  Avoid things that make you cough, such as perfume, candles, cleaning products, or campfire or tobacco smoke.  If the air is  dry, use a cool mist vaporizer or humidifier in your bedroom or your home to help loosen secretions.  If your cough is worse at night, try to sleep in a semi-upright position.  Rest as needed.  Keep all follow-up visits as told by your health care provider. This is important. Contact a health care provider if you:  Have new symptoms.  Cough up pus.  Have a cough that does not get better after 2-3 weeks or gets worse.  Cannot control your cough with cough suppressant medicines and you are losing sleep.  Have pain that gets worse or pain that is not helped with medicine.  Have a fever.  Have unexplained weight loss.  Have night sweats. Get help right away if:  You cough up blood.  You have difficulty breathing.  Your heartbeat is very fast. These symptoms may represent a serious problem that is an emergency. Do not wait to see if the symptoms will go away. Get medical help right away. Call your local emergency services (911 in the [place] Do not drive yourself to the hospital. Summary  Coughing is a reflex that clears your throat and your airways. It is normal to cough occasionally, but a cough that happens with other symptoms or lasts a long time may be a sign of a condition that needs treatment.  Take over-the-counter and prescription medicines only as told by your health care provider.  Always cover your mouth when you cough.  Contact a health care provider if you have new symptoms or a cough that does not get  better after 2-3 weeks or gets worse. This information is not intended to replace advice given to you by your health care provider. Make sure you discuss any questions you have with your health care provider. Document Revised: 02/27/2018 Document Reviewed: 02/27/2018 Elsevier Patient Education  [place].

## 2019-05-20 ENCOUNTER — Emergency Department (HOSPITAL_COMMUNITY): Payer: HRSA Program

## 2019-05-20 ENCOUNTER — Other Ambulatory Visit: Payer: Self-pay

## 2019-05-20 ENCOUNTER — Emergency Department (HOSPITAL_COMMUNITY)
Admission: EM | Admit: 2019-05-20 | Discharge: 2019-05-20 | Disposition: A | Discharge status: Home / Self Care | Payer: HRSA Program | Attending: Emergency Medicine | Admitting: Emergency Medicine

## 2019-05-20 ENCOUNTER — Encounter (HOSPITAL_COMMUNITY): Payer: Self-pay | Admitting: Emergency Medicine

## 2019-05-20 DIAGNOSIS — U071 COVID-19: Secondary | ICD-10-CM | POA: Diagnosis not present

## 2019-05-20 DIAGNOSIS — B20 Human immunodeficiency virus [HIV] disease: Secondary | ICD-10-CM | POA: Diagnosis not present

## 2019-05-20 DIAGNOSIS — R059 Cough, unspecified: Secondary | ICD-10-CM

## 2019-05-20 DIAGNOSIS — Z79899 Other long term (current) drug therapy: Secondary | ICD-10-CM | POA: Insufficient documentation

## 2019-05-20 DIAGNOSIS — R05 Cough: Secondary | ICD-10-CM | POA: Diagnosis present

## 2019-05-20 MED ORDER — GUAIFENESIN ER 1200 MG PO TB12: 1.0000 | ORAL_TABLET | Freq: Two times a day (BID) | ORAL | 0 refills | Status: DC

## 2019-05-20 MED ORDER — PROMETHAZINE-DM 6.25-15 MG/5ML PO SYRP: 5.0000 mL | ORAL_SOLUTION | Freq: Four times a day (QID) | ORAL | 0 refills | Status: DC | PRN

## 2019-05-20 NOTE — ED Triage Notes (Addendum)
Pt states he got 1st COVID vaccine 2 weeks ago and after getting vaccine started having fever and non-productive cough.  Fever has resolved but continues to have cough.  States there has been recent COVID cases at his job.  Pt had virtual visit with PCP and started allergy medication without relief.

## 2019-05-20 NOTE — ED Provider Notes (Addendum)
[facility] EMERGENCY DEPARTMENT Provider Note   CSN: [id number] Arrival date & time: 05/20/19  1551     History Chief Complaint  Patient presents with  . Cough    Robert Stout is a 44 y.o. male.  HPI Patient presents to the emergency department with cough that started on March 20.  The patient states that he got the [employer] vaccination on the 19th.  The patient states that several people at his work have been sick with Covid.  The patient states that he has been having dry cough but no other symptoms at this time.  He stated the first 2 days he did have some chills body aches but those have resolved.  Patient was concerned about his continued symptoms.  The patient states that nothing seems to make his condition better or worse.  The patient denies chest pain, shortness of breath, headache,blurred vision, neck pain, fever, weakness, numbness, dizziness, anorexia, edema, abdominal pain, nausea, vomiting, diarrhea, rash, back pain, dysuria, hematemesis, bloody stool, near syncope, or syncope.    Past Medical History:  Diagnosis Date  . Caries 01/03/2017  . Genital warts 10/28/2016  . 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 HIV infection (HCC) Dx 2011  . Rectal bleeding 10/09/2018  . Weight gain 10/09/2018    Patient Active Problem List   Diagnosis Date Noted  . Weight gain 10/09/2018  . Rectal bleeding 10/09/2018  . Pleuritic chest pain 02/07/2018  . Viral upper respiratory tract infection with cough 01/12/2017  . Caries 01/03/2017  . Genital warts 10/28/2016  . 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 Lung nodule   . Homelessness   . HIV disease (HCC) 02/07/2014    Past Surgical History:  Procedure Laterality Date  . TENDON REPAIR         Family History  Problem Relation Age of Onset  . Diabetes Mother   . Hypertension Mother     Social  History   Tobacco Use  . Smoking status: Never Smoker  . Smokeless tobacco: Never Used  Substance Use Topics  . Alcohol use: No    Alcohol/week: 0.0 standard drinks  . Drug use: No    Home Medications Prior to Admission medications   Medication Sig Start Date End Date Taking? Authorizing Provider  albuterol (VENTOLIN HFA) 108 (90 Base) MCG/ACT inhaler Inhale 2 puffs into the lungs every 6 (six) hours as needed for wheezing or shortness of breath. 02/28/19   Randall Hiss, MD  allopurinol (ZYLOPRIM) 100 MG tablet Take 1 tablet (100 mg total) by mouth daily. 11/07/18   Claiborne Rigg, NP  BIKTARVY [phone] MG TABS tablet TAKE 1 TABLET BY MOUTH DAILY 03/14/19   Daiva Eves, Lisette Grinder, MD  imiquimod [facility]) 5 % cream APPLY CONTENTS OF 1 PACKET TOPICALLY TO THE AFFECTED AREA THREE TIMES PER WEEK 03/09/19   Daiva Eves, Lisette Grinder, MD  loratadine (CLARITIN) 10 MG tablet Take 1 tablet (10 mg total) by mouth daily. 05/17/19   Mayers, Cari S, PA-C  mometasone-formoterol (DULERA) 100-5 MCG/ACT AERO Inhale 2 puffs into the lungs 2 (two) times daily. 02/28/19   Randall Hiss, MD  valACYclovir (VALTREX) 500 MG tablet Take 1 tablet (500 mg total) by mouth 2 (two) times daily. 1 TABLET DAILY Patient not taking: Reported on 04/23/2019 08/07/18   Daiva Eves, Lisette Grinder, MD  Allergies    Patient has no known allergies.  Review of Systems   Review of Systems All other systems negative except as documented in the HPI. All pertinent positives and negatives as reviewed in the HPI. Physical Exam Updated Vital Signs BP 115/85 (BP Location: Left Arm)   Pulse 73   Temp 98.1 F (36.7 C) (Oral)   Resp 16   Ht 5\' 10"  (1.778 m)   Wt 117.9 kg   SpO2 97%   BMI 37.31 kg/m   Physical Exam Vitals and nursing note reviewed.  Constitutional:      General: He is not in acute distress.    Appearance: He is well-developed.  HENT:     Head: Normocephalic and atraumatic.     Right Ear: Tympanic membrane  normal.     Left Ear: Tympanic membrane normal.     Mouth/Throat:     Mouth: Mucous membranes are moist.  Eyes:     Pupils: Pupils are equal, round, and reactive to light.  Cardiovascular:     Rate and Rhythm: Normal rate and regular rhythm.     Heart sounds: Normal heart sounds. No murmur. No friction rub. No gallop.   Pulmonary:     Effort: Pulmonary effort is normal. No respiratory distress.     Breath sounds: Normal breath sounds. No stridor. No wheezing, rhonchi or rales.  Musculoskeletal:     Cervical back: Normal range of motion and neck supple.  Skin:    General: Skin is warm and dry.     Capillary Refill: Capillary refill takes less than 2 seconds.     Findings: No erythema or rash.  Neurological:     Mental Status: He is alert and oriented to person, place, and time.     Motor: No abnormal muscle tone.     Coordination: Coordination normal.  Psychiatric:        Behavior: Behavior normal.     ED Results / Procedures / Treatments   Labs (all labs ordered are listed, but only abnormal results are displayed) Labs Reviewed  SARS CORONAVIRUS 2 (TAT 6-24 HRS)    EKG None  Radiology DG Chest Port 1 View  Result Date: 05/20/2019 CLINICAL DATA:  Cough EXAM: PORTABLE CHEST 1 VIEW COMPARISON:  02/06/2018 FINDINGS: The heart size and mediastinal contours are within normal limits. Both lungs are clear. The visualized skeletal structures are unremarkable. IMPRESSION: No active disease. Electronically Signed   By: Randa Ngo M.D.   On: 05/20/2019 16:36    Procedures Procedures (including critical care time)  Medications Ordered in ED Medications - No data to display  ED Course  I have reviewed the triage vital signs and the nursing notes.  Pertinent labs & imaging results that were available during my care of the patient were reviewed by me and considered in my medical decision making (see chart for details).    MDM Rules/Calculators/A&P                       Patient will be tested for Covid.  I did advise the patient to increase his fluid intake and rest as much as possible.  We will give him time off of work until he can get these results back.  Patient is stable here in the emergency department has no abnormalities in his vital signs.  Robert Stout was evaluated in Emergency Department on 05/20/2019 for the symptoms described in the history of present illness. He was evaluated in  the context of the global COVID-19 pandemic, which necessitated consideration that the patient might be at risk for infection with the SARS-CoV-2 virus that causes COVID-19. Institutional protocols and algorithms that pertain to the evaluation of patients at risk for COVID-19 are in a state of rapid change based on information released by regulatory bodies including the CDC and federal and state organizations. These policies and algorithms were followed during the patient's care in the ED.  Final Clinical Impression(s) / ED Diagnoses Final diagnoses:  None    Rx / DC Orders ED Discharge Orders    None       Charlestine Night, PA-C 05/20/19 1717    Mycala Warshawsky, Cristal Deer, PA-C 05/20/19 1717    Derwood Kaplan, MD 05/22/19 0120

## 2019-05-20 NOTE — Discharge Instructions (Signed)
Return here as needed.  You can check my chart for your Covid results.  Follow-up with your primary doctor.  Increase your fluid intake.

## 2019-05-20 NOTE — ED Notes (Signed)
Patient verbalizes understanding of discharge instructions. Opportunity for questioning and answers were provided. Armband removed by staff, pt discharged from ED ambulatory.   

## 2019-05-21 ENCOUNTER — Encounter: Payer: Self-pay | Admitting: Nurse Practitioner

## 2019-05-21 LAB — SARS CORONAVIRUS 2 (TAT 6-24 HRS): SARS Coronavirus 2: POSITIVE — AB

## 2019-05-23 ENCOUNTER — Other Ambulatory Visit: Payer: Self-pay

## 2019-05-23 ENCOUNTER — Ambulatory Visit: Payer: HRSA Program | Admitting: Critical Care Medicine

## 2019-05-23 DIAGNOSIS — U071 COVID-19: Secondary | ICD-10-CM | POA: Insufficient documentation

## 2019-05-23 DIAGNOSIS — B20 Human immunodeficiency virus [HIV] disease: Secondary | ICD-10-CM

## 2019-05-23 DIAGNOSIS — Z6837 Body mass index (BMI) 37.0-37.9, adult: Secondary | ICD-10-CM | POA: Diagnosis not present

## 2019-05-23 NOTE — Progress Notes (Signed)
Subjective:    Patient ID: Robert Stout, male    DOB: 13-Jan-1976, 44 y.o.   MRN: [id number] Virtual Visit via Telephone Note  I connected with Robert Stout on 05/23/19 at  9:30 AM EDT by telephone and verified that I am speaking with the correct person using two identifiers.   Consent:  I discussed the limitations, risks, security and privacy concerns of performing an evaluation and management service by telephone and the availability of in person appointments. I also discussed with the patient that there may be a patient responsible charge related to this service. The patient expressed understanding and agreed to proceed.  Location of patient: Patient was at home  Location of provider: I was in my office  Persons participating in the televisit with the patient.    No one else on the call   History of Present Illness: This is a 44 year old male who has HIV and previous homelessness but has not been homeless in 3 years.  This patient had his COVID-19 first vaccine dose March 19 but did report the onset on March 20 of fever cough and some muscle aches.  He thought this was related to the Covid vaccine and did not seek any attention however because persistence of the cough was occurring he went to the emergency room on March 28 whereupon he was tested and was positive for Covid.  At that point he only had the cough and had not had any fever at that point in time.  The patient states now he no longer has any symptoms whatsoever he did take over-the-counter cough medicine the cough is improving he is not short of breath he has no muscle aches he has no diarrhea headaches dizziness wheezing or other conditions related to Covid at this time.  His chest x-ray was normal in the emergency room.  His labs were not revealing.  His main risk factors are that of HIV and obesity.  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 Caries 01/03/2017  . Genital warts 10/28/2016  . 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 HIV infection ([place]) Dx 2011  . Rectal bleeding 10/09/2018  . Weight gain 10/09/2018     Family History  Problem Relation Age of Onset  . Diabetes Mother   . Hypertension Mother      Social History   Socioeconomic History  . Marital status: Single    Spouse name: Not on file  . Number of children: Not on file  . Years of education: Not on file  . Highest education level: Not on file  Occupational History  . Not on file  Tobacco Use  . Smoking status: Never Smoker  . Smokeless tobacco: Never Used  Substance and Sexual Activity  . Alcohol use: No    Alcohol/week: 0.0 standard drinks  . Drug use: No  . Sexual activity: Never  Other Topics Concern  . Not on file  Social History Narrative  . Not on file   Social Determinants of Health   Financial Resource Strain:   . Difficulty of Paying Living Expenses:   Food Insecurity:   . Worried About Charity fundraiser in the Last Year:   . [place] in the Last Year:  Transportation Needs:   . Film/video editor (Medical):   Marland Kitchen Lack of Transportation (Non-Medical):   Physical Activity:   . Days of Exercise per Week:   . Minutes of Exercise per Session:   Stress:   . Feeling of Stress :   Social Connections:   . Frequency of Communication with Friends and Family:   . Frequency of Social Gatherings with Friends and Family:   . Attends Religious Services:   . Active Member of Clubs or Organizations:   . Attends Archivist Meetings:   Marland Kitchen Marital Status:   Intimate Partner Violence:   . Fear of Current or Ex-Partner:   . Emotionally Abused:   Marland Kitchen Physically Abused:   . Sexually Abused:      No Known Allergies   Outpatient Medications Prior to Visit  Medication Sig Dispense Refill  . albuterol  (VENTOLIN HFA) 108 (90 Base) MCG/ACT inhaler Inhale 2 puffs into the lungs every 6 (six) hours as needed for wheezing or shortness of breath. 18 g 5  . allopurinol (ZYLOPRIM) 100 MG tablet Take 1 tablet (100 mg total) by mouth daily. 90 tablet 2  . BIKTARVY 50-200-25 MG TABS tablet TAKE 1 TABLET BY MOUTH DAILY 30 tablet 4  . Guaifenesin 1200 MG TB12 Take 1 tablet (1,200 mg total) by mouth 2 (two) times daily. 20 tablet 0  . imiquimod (ALDARA) 5 % cream APPLY CONTENTS OF 1 PACKET TOPICALLY TO THE AFFECTED AREA THREE TIMES PER WEEK 12 each 3  . loratadine (CLARITIN) 10 MG tablet Take 1 tablet (10 mg total) by mouth daily. 30 tablet 11  . mometasone-formoterol (DULERA) 100-5 MCG/ACT AERO Inhale 2 puffs into the lungs 2 (two) times daily. 13 g 11  . promethazine-dextromethorphan (PROMETHAZINE-DM) 6.25-15 MG/5ML syrup Take 5 mLs by mouth 4 (four) times daily as needed for cough. 200 mL 0  . valACYclovir (VALTREX) 500 MG tablet Take 1 tablet (500 mg total) by mouth 2 (two) times daily. 1 TABLET DAILY (Patient not taking: Reported on 04/23/2019) 14 tablet 0   No facility-administered medications prior to visit.      Review of Systems Constitutional:   No  weight loss, night sweats,  Fevers, chills, fatigue, lassitude. HEENT:   No headaches,  Difficulty swallowing,  Tooth/dental problems,  Sore throat,                No sneezing, itching, ear ache, nasal congestion, post nasal drip,   CV:  No chest pain,  Orthopnea, PND, swelling in lower extremities, anasarca, dizziness, palpitations  GI  No heartburn, indigestion, abdominal pain, nausea, vomiting, diarrhea, change in bowel habits, loss of appetite  Resp: No shortness of breath with exertion or at rest.  No excess mucus, no productive cough,   non-productive cough,  No coughing up of blood.  No change in color of mucus.  No wheezing.  No chest wall deformity  Skin: no rash or lesions.  GU: no dysuria, change in color of urine, no urgency or  frequency.  No flank pain.  MS:  No joint pain or swelling.  No decreased range of motion.  No back pain.  Psych:  No change in mood or affect. No depression or anxiety.  No memory loss.     Objective:   Physical Exam No exam this was a telemedicine telephone visit  BMP Latest Ref Rng & Units 03/19/2019 09/25/2018 04/25/2018  Glucose 65 - 99 mg/dL 92 66 100(H)  BUN 7 - 25 mg/dL  $'10 11 9  'M$ Creatinine 0.60 - 1.35 mg/dL 1.22 1.49(H) 1.45(H)  BUN/Creat Ratio 6 - 22 (calc) NOT APPLICABLE 7 6  Sodium 726 - 146 mmol/L 137 139 138  Potassium 3.5 - 5.3 mmol/L 4.2 3.9 4.2  Chloride 98 - 110 mmol/L 102 105 104  CO2 20 - 32 mmol/L '27 26 26  '$ Calcium 8.6 - 10.3 mg/dL 9.5 9.3 9.5   CBC Latest Ref Rng & Units 03/19/2019 09/25/2018 04/25/2018  WBC 3.8 - 10.8 Thousand/uL 7.7 7.0 5.4  Hemoglobin 13.2 - 17.1 g/dL 14.0 13.6 14.2  Hematocrit 38.5 - 50.0 % 41.1 40.3 41.7  Platelets 140 - 400 Thousand/uL 303 309 329   Hepatic Function Latest Ref Rng & Units 03/19/2019 09/25/2018 04/25/2018  Total Protein 6.1 - 8.1 g/dL 7.5 7.0 7.4  Albumin 3.5 - 5.5 g/dL - - -  AST 10 - 40 U/L '25 20 24  '$ ALT 9 - 46 U/L '20 15 19  '$ Alk Phosphatase 39 - 117 IU/L - - -  Total Bilirubin 0.2 - 1.2 mg/dL 0.4 0.3 0.5          Assessment & Plan:  I personally reviewed all images and lab data in the [facility] system as well as any outside material available during this office visit and agree with the  radiology impressions.   COVID-19 virus infection COVID-19 viral infection with complete resolution of all symptoms at this time.  Note patient is HIV positive.  Note he has been in isolation for 10 days and since he is not had fever the last 3 days is not having significant symptoms he can now be out of isolation as of today.  He presented late to the emergency room in the course of his illness.  I did recommend he use Dulera and Ventolin as needed  I also recommended that he could return to work on April 1 and wrote a letter for this patient  and emailed to him  Return visit in the office will be scheduled in May  He does not need to delay his Covid vaccine in that he has already recovered during the illness and will have been 2 weeks out by the time his next vaccine dose is due April 6   Diagnoses and all orders for this visit:  COVID-19 virus infection     Follow Up Instructions: The patient knows an in office exam will occur end of April 1 May with his primary care provider, the patient will also receive a work release letter by email   I discussed the assessment and treatment plan with the patient. The patient was provided an opportunity to ask questions and all were answered. The patient agreed with the plan and demonstrated an understanding of the instructions.   The patient was advised to call back or seek an in-person evaluation if the symptoms worsen or if the condition fails to improve as anticipated.  I provided 30 minutes of non-face-to-face time during this encounter  including  median intraservice time , review of notes, labs, imaging, medications  and explaining diagnosis and management to the patient .    Asencion Noble, MD

## 2019-05-23 NOTE — Assessment & Plan Note (Signed)
COVID-19 viral infection with complete resolution of all symptoms at this time.  Note patient is HIV positive.  Note he has been in isolation for 10 days and since he is not had fever the last 3 days is not having significant symptoms he can now be out of isolation as of today.  He presented late to the emergency room in the course of his illness.  I did recommend he use Dulera and Ventolin as needed  I also recommended that he could return to work on April 1 and wrote a letter for this patient and emailed to him  Return visit in the office will be scheduled in May  He does not need to delay his Covid vaccine in that he has already recovered during the illness and will have been 2 weeks out by the time his next vaccine dose is due April 6

## 2019-06-13 ENCOUNTER — Telehealth: Payer: Self-pay | Admitting: Pharmacy Technician

## 2019-06-13 NOTE — Telephone Encounter (Addendum)
[facility] Patient Advocate Encounter  Patient has successfully received his Albuterol inhalers free of charge from [employer] but was previously unable to obtain the [facility] from [employer]. In using the online portal, it says that Dr. Zenaida Niece Dam's clinical designation does not allow for free medication to be sent to the clinic or a voucher for the patient. The application for patient assistance was required to be sent in paper mail system, however follow-up was never received from [employer]. He is umap but Elwin Sleight is not on the formulary and cost several hundred dollars out of pocket.   I spoke with a rep at [employer] and they informed me that [employer] is dropping [place] from their savings program as of Jul 23, 2019. His previous shipment was delivered on 04/28/2019 with tracking number [id number] via [employer]. I left a voicemail today to see if he received a 90-day supply in March. When I spoke to him at the March 1 appointment he had not yet heard from the company.

## 2019-06-14 ENCOUNTER — Other Ambulatory Visit: Payer: Self-pay | Admitting: Nurse Practitioner

## 2019-06-14 ENCOUNTER — Encounter: Payer: Self-pay | Admitting: Nurse Practitioner

## 2019-06-14 DIAGNOSIS — R360 Urethral discharge without blood: Secondary | ICD-10-CM

## 2019-06-15 ENCOUNTER — Encounter (HOSPITAL_COMMUNITY): Payer: Self-pay | Admitting: Pediatrics

## 2019-06-15 ENCOUNTER — Other Ambulatory Visit: Payer: Self-pay

## 2019-06-15 ENCOUNTER — Emergency Department (HOSPITAL_COMMUNITY)
Admission: EM | Admit: 2019-06-15 | Discharge: 2019-06-15 | Disposition: A | Discharge status: Home / Self Care | Payer: Self-pay | Attending: Emergency Medicine | Admitting: Emergency Medicine

## 2019-06-15 DIAGNOSIS — Z79899 Other long term (current) drug therapy: Secondary | ICD-10-CM | POA: Insufficient documentation

## 2019-06-15 DIAGNOSIS — B20 Human immunodeficiency virus [HIV] disease: Secondary | ICD-10-CM | POA: Insufficient documentation

## 2019-06-15 DIAGNOSIS — Z202 Contact with and (suspected) exposure to infections with a predominantly sexual mode of transmission: Secondary | ICD-10-CM | POA: Insufficient documentation

## 2019-06-15 LAB — URINALYSIS, ROUTINE W REFLEX MICROSCOPIC
Bilirubin Urine: NEGATIVE
Glucose, UA: NEGATIVE mg/dL
Hgb urine dipstick: NEGATIVE
Ketones, ur: NEGATIVE mg/dL
Nitrite: NEGATIVE
Protein, ur: NEGATIVE mg/dL
Specific Gravity, Urine: 1.009 (ref 1.005–1.030)
WBC, UA: >50 WBC/hpf — ABNORMAL HIGH (ref 0–5)
pH: 6 (ref 5.0–8.0)

## 2019-06-15 MED ORDER — LIDOCAINE HCL (PF) 1 % IJ SOLN
INTRAMUSCULAR | Status: AC
  Administered 2019-06-15: 1 mL
  Filled 2019-06-15: qty 5

## 2019-06-15 MED ORDER — DOXYCYCLINE HYCLATE 100 MG PO CAPS: 100.0000 mg | ORAL_CAPSULE | Freq: Two times a day (BID) | ORAL | 0 refills | Status: AC

## 2019-06-15 MED ORDER — CEFTRIAXONE SODIUM 500 MG IJ SOLR
500.0000 mg | Freq: Once | INTRAMUSCULAR | Status: AC
  Administered 2019-06-15: 500 mg via INTRAMUSCULAR
  Filled 2019-06-15: qty 500

## 2019-06-15 MED FILL — ?DOXYCYCLINE HYCLATE 100MG: 100 | 7 days supply | Qty: 14 | Fill #0

## 2019-06-15 NOTE — ED Triage Notes (Signed)
Patient reported partner has some UTI; concerns for potential issue and c/o irritation and urinary frequency

## 2019-06-15 NOTE — ED Provider Notes (Signed)
[facility] EMERGENCY DEPARTMENT Provider Note   CSN: [id number] Arrival date & time: 06/15/19  1145     History Chief Complaint  Patient presents with  . URINARY FREQUENCY  . Exposure to STD    Robert Stout is a 44 y.o. male.  HPI   Patient is a 44 year old male with a history of dental caries, genital warts, HSV, HIV, rectal bleeding, who presents the emergency department today for evaluation of potential sexually transmitted infection.  States that for the last several days he has had urinary frequency and penile discharge that is clear/white.  He denies any dysuria or hematuria.  Denies any fevers or abdominal pain.  He states he has had a sexually transmitted infection in the past and his symptoms today are similar to that.  He states that his partner was diagnosed with a UTI.  He is unsure if she was diagnosed with an STD.  They do not use protection during intercourse.  Past Medical History:  Diagnosis Date  . Caries 01/03/2017  . Genital warts 10/28/2016  . 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 HIV infection (HCC) Dx 2011  . Rectal bleeding 10/09/2018  . Weight gain 10/09/2018    Patient Active Problem List   Diagnosis Date Noted  . COVID-19 virus infection 05/23/2019  . BMI 37.0-37.9, adult 10/09/2018  . Caries 01/03/2017  . Genital warts 10/28/2016  . 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 Lung nodule   . HIV disease (HCC) 02/07/2014    Past Surgical History:  Procedure Laterality Date  . TENDON REPAIR         Family History  Problem Relation Age of Onset  . Diabetes Mother   . Hypertension Mother     Social History   Tobacco Use  . Smoking status: Never Smoker  . Smokeless tobacco: Never Used  Substance Use Topics  . Alcohol use: No    Alcohol/week: 0.0 standard drinks  . Drug use: No    Home Medications Prior to  Admission medications   Medication Sig Start Date End Date Taking? Authorizing Provider  albuterol (VENTOLIN HFA) 108 (90 Base) MCG/ACT inhaler Inhale 2 puffs into the lungs every 6 (six) hours as needed for wheezing or shortness of breath. 02/28/19   Randall Hiss, MD  allopurinol (ZYLOPRIM) 100 MG tablet Take 1 tablet (100 mg total) by mouth daily. 11/07/18   Claiborne Rigg, NP  BIKTARVY [phone] MG TABS tablet TAKE 1 TABLET BY MOUTH DAILY 03/14/19   Daiva Eves, Lisette Grinder, MD  doxycycline (VIBRAMYCIN) 100 MG capsule Take 1 capsule (100 mg total) by mouth 2 (two) times daily for 7 days. 06/15/19 06/22/19  Rhilee Currin S, PA-C  Guaifenesin 1200 MG TB12 Take 1 tablet (1,200 mg total) by mouth 2 (two) times daily. 05/20/19   Lawyer, Cristal Deer, PA-C  imiquimod (ALDARA) 5 % cream APPLY CONTENTS OF 1 PACKET TOPICALLY TO THE AFFECTED AREA THREE TIMES PER WEEK 03/09/19   Daiva Eves, Lisette Grinder, MD  loratadine (CLARITIN) 10 MG tablet Take 1 tablet (10 mg total) by mouth daily. 05/17/19   Mayers, Cari S, PA-C  mometasone-formoterol (DULERA) 100-5 MCG/ACT AERO Inhale 2 puffs into the lungs 2 (two) times daily. 02/28/19   Randall Hiss, MD  promethazine-dextromethorphan (PROMETHAZINE-DM) 6.25-15 MG/5ML syrup Take 5 mLs by mouth 4 (four) times daily as needed  for cough. 05/20/19   Lawyer, Harrell Gave, PA-C  valACYclovir (VALTREX) 500 MG tablet Take 1 tablet (500 mg total) by mouth 2 (two) times daily. 1 TABLET DAILY Patient not taking: Reported on 04/23/2019 08/07/18   Tommy Medal, Lavell Islam, MD    Allergies    Patient has no known allergies.  Review of Systems   Review of Systems  Constitutional: Negative for fever.  Respiratory: Negative for shortness of breath.   Cardiovascular: Negative for chest pain.  Gastrointestinal: Negative for abdominal pain, nausea and vomiting.  Genitourinary: Positive for discharge and frequency. Negative for dysuria, hematuria, penile pain, penile swelling, scrotal  swelling and testicular pain.  Neurological: Negative for headaches.    Physical Exam Updated Vital Signs BP 123/77   Pulse 96   Temp 98.6 F (37 C) (Oral)   Resp 16   Ht 5\' 10"  (1.778 m)   Wt 113.4 kg   SpO2 99%   BMI 35.87 kg/m   Physical Exam Constitutional:      General: He is not in acute distress.    Appearance: He is well-developed.  Eyes:     Conjunctiva/sclera: Conjunctivae normal.  Cardiovascular:     Rate and Rhythm: Normal rate and regular rhythm.  Pulmonary:     Effort: Pulmonary effort is normal.     Breath sounds: Normal breath sounds.  Genitourinary:    Comments: Chaperone present. Normal uncircumcised penis. No erythema, swelling, or redness noted to the scrotum. No lesions. No discharge noted on exam. Skin:    General: Skin is warm and dry.  Neurological:     Mental Status: He is alert and oriented to person, place, and time.     ED Results / Procedures / Treatments   Labs (all labs ordered are listed, but only abnormal results are displayed) Labs Reviewed  URINALYSIS, ROUTINE W REFLEX MICROSCOPIC - Abnormal; Notable for the following components:      Result Value   APPearance HAZY (*)    Leukocytes,Ua LARGE (*)    WBC, UA >50 (*)    Bacteria, UA FEW (*)    All other components within normal limits  URINE CULTURE  RPR  GC/CHLAMYDIA PROBE AMP ([place]) NOT AT [facility]    EKG None  Radiology No results found.  Procedures Procedures (including critical care time)  Medications Ordered in ED Medications  cefTRIAXone (ROCEPHIN) injection 500 mg (has no administration in time range)    ED Course  I have reviewed the triage vital signs and the nursing notes.  Pertinent labs & imaging results that were available during my care of the patient were reviewed by me and considered in my medical decision making (see chart for details).    MDM Rules/Calculators/A&P                     Patient is afebrile without abdominal tenderness,  abdominal pain or painful bowel movements to indicate prostatitis.  No tenderness to palpation of the testes or epididymis to suggest orchitis or epididymitis.  STD cultures obtained including syphilis, gonorrhea and chlamydia. Pt hiv + therefore restesting is not indicated. UA with leukocytes, >50 WBC and few bacteria. More likely related to STI rather than UTI but will culture to rule this out since he is having urinary frequency. Patient to be discharged with instructions to follow up with PCP. Discussed importance of using protection when sexually active. Pt understands that they have GC/Chlamydia cultures pending and that they will need to inform all  sexual partners if results return positive. Patient has been treated prophylactically with doxycycline and Rocephin.    Final Clinical Impression(s) / ED Diagnoses Final diagnoses:  Possible exposure to STD    Rx / DC Orders ED Discharge Orders         Ordered    doxycycline (VIBRAMYCIN) 100 MG capsule  2 times daily     06/15/19 [address], [place], PA-C 06/15/19 1319    Tegeler, Canary Brim, MD 06/15/19 [phone]

## 2019-06-15 NOTE — ED Notes (Signed)
Patient verbalizes understanding of discharge instructions. Opportunity for questioning and answers were provided. Armband removed by staff, pt discharged from ED ambulatory.   

## 2019-06-15 NOTE — Discharge Instructions (Addendum)
You were given a prescription for antibiotics. Please take the antibiotic prescription fully.   A culture was sent of your urine today to determine if there is any bacterial growth. If the results of the culture are positive and you require an antibiotic or a change of your prescribed antibiotic you will be contacted by the hospital. If the results are negative you will not be contacted.  You have been tested for HIV, syphilis, chlamydia and gonorrhea.  These results will be available in approximately 3 days and you will be contacted by the hospital if the results are positive. Avoid sexual contact until you are aware of the results, and please inform all sexual partners if you test positive for any of these diseases.  Please follow up with your primary care provider within 5-7 days for re-evaluation of your symptoms.   Please return to the emergency department for any new or worsening symptoms.

## 2019-06-16 LAB — RPR: RPR Ser Ql: NONREACTIVE

## 2019-06-17 LAB — URINE CULTURE: Culture: NO GROWTH

## 2019-06-18 LAB — GC/CHLAMYDIA PROBE AMP (~~LOC~~) NOT AT ARMC
Chlamydia: POSITIVE — AB
Comment: NEGATIVE
Comment: NORMAL
Neisseria Gonorrhea: POSITIVE — AB

## 2019-06-18 MED FILL — SF 5000 PLUS CREAM: 1.1 | 30 days supply | Qty: 51 | Fill #2

## 2019-06-18 MED FILL — ?ALLOPURINOL 100MG TABLET: 100 | 30 days supply | Qty: 30 | Fill #7

## 2019-06-19 ENCOUNTER — Encounter: Payer: Self-pay | Admitting: Nurse Practitioner

## 2019-06-21 ENCOUNTER — Ambulatory Visit: Payer: Self-pay | Admitting: Nurse Practitioner

## 2019-06-22 ENCOUNTER — Other Ambulatory Visit: Payer: Self-pay

## 2019-06-22 ENCOUNTER — Ambulatory Visit: Payer: Self-pay | Attending: Nurse Practitioner | Admitting: Nurse Practitioner

## 2019-06-22 NOTE — Progress Notes (Signed)
Erroneous

## 2019-07-07 ENCOUNTER — Other Ambulatory Visit: Payer: Self-pay | Admitting: Infectious Disease

## 2019-07-10 ENCOUNTER — Other Ambulatory Visit: Payer: Self-pay

## 2019-07-10 ENCOUNTER — Ambulatory Visit: Payer: Self-pay | Attending: Nurse Practitioner

## 2019-07-26 NOTE — Telephone Encounter (Signed)
error 

## 2019-08-03 ENCOUNTER — Other Ambulatory Visit: Payer: Self-pay | Admitting: Infectious Disease

## 2019-08-03 DIAGNOSIS — B20 Human immunodeficiency virus [HIV] disease: Secondary | ICD-10-CM

## 2019-08-03 MED FILL — ?ALLOPURINOL 100MG TABLET: 100 | 30 days supply | Qty: 30 | Fill #8

## 2019-08-23 ENCOUNTER — Other Ambulatory Visit: Payer: Self-pay

## 2019-08-23 ENCOUNTER — Ambulatory Visit: Payer: Self-pay

## 2019-09-17 ENCOUNTER — Other Ambulatory Visit: Payer: Self-pay

## 2019-09-19 ENCOUNTER — Other Ambulatory Visit: Payer: Self-pay

## 2019-09-19 DIAGNOSIS — A630 Anogenital (venereal) warts: Secondary | ICD-10-CM

## 2019-09-19 DIAGNOSIS — B20 Human immunodeficiency virus [HIV] disease: Secondary | ICD-10-CM

## 2019-09-19 DIAGNOSIS — B009 Herpesviral infection, unspecified: Secondary | ICD-10-CM

## 2019-09-20 LAB — T-HELPER CELL (CD4) - (RCID CLINIC ONLY)
CD4 % Helper T Cell: 33 % (ref 33–65)
CD4 T Cell Abs: 786 /uL (ref 400–1790)

## 2019-09-24 LAB — LIPID PANEL
Cholesterol: 185 mg/dL (ref <200)
HDL: 35 mg/dL — ABNORMAL LOW (ref >=40)
LDL Cholesterol (Calc): 125 mg/dL (calc) — ABNORMAL HIGH
Non-HDL Cholesterol (Calc): 150 mg/dL (calc) — ABNORMAL HIGH (ref <130)
Total CHOL/HDL Ratio: 5.3 (calc) — ABNORMAL HIGH (ref <5.0)
Triglycerides: 130 mg/dL (ref <150)

## 2019-09-24 LAB — CBC WITH DIFFERENTIAL/PLATELET
Absolute Monocytes: 387 cells/uL (ref 200–950)
Basophils Absolute: 39 cells/uL (ref 0–200)
Basophils Relative: 0.8 %
Eosinophils Absolute: 118 cells/uL (ref 15–500)
Eosinophils Relative: 2.4 %
HCT: 42 % (ref 38.5–50.0)
Hemoglobin: 14.1 g/dL (ref 13.2–17.1)
Lymphs Abs: 2274 cells/uL (ref 850–3900)
MCH: 31.3 pg (ref 27.0–33.0)
MCHC: 33.6 g/dL (ref 32.0–36.0)
MCV: 93.1 fL (ref 80.0–100.0)
MPV: 10 fL (ref 7.5–12.5)
Monocytes Relative: 7.9 %
Neutro Abs: 2083 cells/uL (ref 1500–7800)
Neutrophils Relative %: 42.5 %
Platelets: 288 10*3/uL (ref 140–400)
RBC: 4.51 10*6/uL (ref 4.20–5.80)
RDW: 12.9 % (ref 11.0–15.0)
Total Lymphocyte: 46.4 %
WBC: 4.9 10*3/uL (ref 3.8–10.8)

## 2019-09-24 LAB — COMPLETE METABOLIC PANEL WITH GFR
AG Ratio: 1.4 (calc) (ref 1.0–2.5)
ALT: 17 U/L (ref 9–46)
AST: 21 U/L (ref 10–40)
Albumin: 4.2 g/dL (ref 3.6–5.1)
Alkaline phosphatase (APISO): 55 U/L (ref 36–130)
BUN/Creatinine Ratio: 10 (calc) (ref 6–22)
BUN: 14 mg/dL (ref 7–25)
CO2: 26 mmol/L (ref 20–32)
Calcium: 9.2 mg/dL (ref 8.6–10.3)
Chloride: 106 mmol/L (ref 98–110)
Creat: 1.4 mg/dL — ABNORMAL HIGH (ref 0.60–1.35)
GFR, Est African American: 69 mL/min/{1.73_m2} (ref >=60)
GFR, Est Non African American: 60 mL/min/{1.73_m2} (ref >=60)
Globulin: 2.9 g/dL (calc) (ref 1.9–3.7)
Glucose, Bld: 94 mg/dL (ref 65–99)
Potassium: 4.1 mmol/L (ref 3.5–5.3)
Sodium: 139 mmol/L (ref 135–146)
Total Bilirubin: 0.5 mg/dL (ref 0.2–1.2)
Total Protein: 7.1 g/dL (ref 6.1–8.1)

## 2019-09-24 LAB — HIV-1 RNA QUANT-NO REFLEX-BLD
HIV 1 RNA Quant: <20 copies/mL — AB
HIV-1 RNA Quant, Log: <1.3 Log copies/mL — AB

## 2019-09-24 LAB — RPR: RPR Ser Ql: NONREACTIVE

## 2019-09-25 ENCOUNTER — Encounter: Payer: Self-pay | Admitting: Infectious Disease

## 2019-10-01 ENCOUNTER — Other Ambulatory Visit: Payer: Self-pay

## 2019-10-01 ENCOUNTER — Ambulatory Visit (INDEPENDENT_AMBULATORY_CARE_PROVIDER_SITE_OTHER): Payer: Self-pay | Admitting: Infectious Disease

## 2019-10-01 ENCOUNTER — Encounter: Payer: Self-pay | Admitting: Infectious Disease

## 2019-10-01 VITALS — BP 118/79 | HR 88 | Temp 99.0°F | Wt 269.0 lb

## 2019-10-01 DIAGNOSIS — Z6837 Body mass index (BMI) 37.0-37.9, adult: Secondary | ICD-10-CM

## 2019-10-01 DIAGNOSIS — B20 Human immunodeficiency virus [HIV] disease: Secondary | ICD-10-CM

## 2019-10-01 DIAGNOSIS — A630 Anogenital (venereal) warts: Secondary | ICD-10-CM

## 2019-10-01 MED ORDER — BIKTARVY 50-200-25 MG PO TABS: 1.0000 | ORAL_TABLET | Freq: Every day | ORAL | 11 refills | Status: DC

## 2019-10-01 NOTE — Progress Notes (Signed)
Subjective:   Chief complaints:  Patient ID: Robert Stout, male    DOB: [birth year], 44 y.o.   MRN: [id number] he is upset that he did not have medicine for 5 days due to meds not being sent in for him immediately on refills expired  HPI  Robert Stout is a 44 year-old African-American man living with HIV that is currently well controlled.  He did have some trouble with homelessness   Suffered from genital warts which we have prescribed Aldara without much response.  Of offered to have him seen at [facility] infectious disease where they can do HRA testing.  He had an ACA plan that would give him more options as well.  He has gained weight on antegrade strand transfer inhibitor and is interested in the DO-IT study from [facility].      Past Medical History:  Diagnosis Date  . Caries 01/03/2017  . Genital warts 10/28/2016  . 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 HIV infection (HCC) Dx 2011  . Rectal bleeding 10/09/2018  . Weight gain 10/09/2018    Past Surgical History:  Procedure Laterality Date  . TENDON REPAIR      Family History  Problem Relation Age of Onset  . Diabetes Mother   . Hypertension Mother       Social History   Socioeconomic History  . Marital status: Single    Spouse name: Not on file  . Number of children: Not on file  . Years of education: Not on file  . Highest education level: Not on file  Occupational History  . Not on file  Tobacco Use  . Smoking status: Never Smoker  . Smokeless tobacco: Never Used  Substance and Sexual Activity  . Alcohol use: No    Alcohol/week: 0.0 standard drinks  . Drug use: No  . Sexual activity: Not Currently  Other Topics Concern  . Not on file  Social History Narrative  . Not on file   Social Determinants of Health   Financial Resource Strain:   . Difficulty of Paying Living Expenses:   Food Insecurity:   . Worried About Programme researcher, broadcasting/film/video in the Last Year:   .  Barista in the Last Year:   Transportation Needs:   . Freight forwarder (Medical):   Marland Kitchen Lack of Transportation (Non-Medical):   Physical Activity:   . Days of Exercise per Week:   . Minutes of Exercise per Session:   Stress:   . Feeling of Stress :   Social Connections:   . Frequency of Communication with Friends and Family:   . Frequency of Social Gatherings with Friends and Family:   . Attends Religious Services:   . Active Member of Clubs or Organizations:   . Attends Banker Meetings:   Marland Kitchen Marital Status:     No Known Allergies   Current Outpatient Medications:  .  albuterol (VENTOLIN HFA) 108 (90 Base) MCG/ACT inhaler, Inhale 2 puffs into the lungs every 6 (six) hours as needed for wheezing or shortness of breath., Disp: 18 g, Rfl: 5 .  allopurinol (ZYLOPRIM) 100 MG tablet, Take 1 tablet (100 mg total) by mouth daily., Disp: 90 tablet, Rfl: 2 .  bictegravir-emtricitabine-tenofovir AF (BIKTARVY) 50-200-25 MG TABS tablet, Take 1 tablet by mouth daily., Disp: 30 tablet, Rfl: 11 .  Guaifenesin 1200 MG TB12, Take 1 tablet (1,200 mg total) by mouth 2 (two) times  daily., Disp: 20 tablet, Rfl: 0 .  imiquimod (ALDARA) 5 % cream, APPLY CONTENTS OF 1 PACKET TOPICALLY TO THE AFFECTED AREA THREE TIMES PER WEEK, Disp: 12 each, Rfl: 3 .  loratadine (CLARITIN) 10 MG tablet, Take 1 tablet (10 mg total) by mouth daily., Disp: 30 tablet, Rfl: 11 .  mometasone-formoterol (DULERA) 100-5 MCG/ACT AERO, Inhale 2 puffs into the lungs 2 (two) times daily., Disp: 13 g, Rfl: 11 .  promethazine-dextromethorphan (PROMETHAZINE-DM) 6.25-15 MG/5ML syrup, Take 5 mLs by mouth 4 (four) times daily as needed for cough., Disp: 200 mL, Rfl: 0 .  valACYclovir (VALTREX) 500 MG tablet, Take 1 tablet (500 mg total) by mouth 2 (two) times daily. 1 TABLET DAILY (Patient not taking: Reported on 10/01/2019), Disp: 14 tablet, Rfl: 0    Review of Systems  Constitutional: Positive for unexpected weight  change. Negative for chills and fever.  HENT: Negative for congestion, dental problem and sore throat.   Eyes: Negative for photophobia.  Respiratory: Negative for cough, shortness of breath and wheezing.   Cardiovascular: Negative for chest pain, palpitations and leg swelling.  Gastrointestinal: Negative for abdominal pain, blood in stool, constipation, diarrhea, nausea and vomiting.  Genitourinary: Negative for dysuria, flank pain and hematuria.  Musculoskeletal: Negative for arthralgias, back pain and myalgias.  Skin: Negative for rash.  Neurological: Negative for dizziness, weakness and headaches.  Hematological: Does not bruise/bleed easily.  Psychiatric/Behavioral: Negative for agitation, behavioral problems and suicidal ideas.       Objective:   Physical Exam Vitals and nursing note reviewed. Exam conducted with a chaperone present.  Constitutional:      General: He is not in acute distress.    Appearance: He is well-developed. He is not diaphoretic.  HENT:     Head: Normocephalic and atraumatic.     Mouth/Throat:     Pharynx: No oropharyngeal exudate.  Eyes:     General: No scleral icterus.    Conjunctiva/sclera: Conjunctivae normal.  Cardiovascular:     Rate and Rhythm: Normal rate and regular rhythm.     Heart sounds: No friction rub.  Pulmonary:     Effort: Pulmonary effort is normal. No respiratory distress.     Breath sounds: No wheezing or rales.  Abdominal:     General: There is no distension.     Palpations: Abdomen is soft.  Genitourinary:    Rectum: Tenderness present.    Musculoskeletal:        General: No tenderness.     Cervical back: Normal range of motion and neck supple.  Skin:    General: Skin is warm and dry.     Coloration: Skin is not pale.     Findings: No erythema or rash.  Neurological:     Mental Status: He is alert and oriented to person, place, and time.     Motor: No abnormal muscle tone.     Coordination: Coordination normal.   Psychiatric:        Attention and Perception: Attention and perception normal.        Mood and Affect: Mood is depressed.        Behavior: Behavior normal.        Thought Content: Thought content normal.        Judgment: Judgment normal.           Assessment & Plan:   HIV and AIDS: Continue BIKTARVY,. HMAP renewed and RTC in January   Asthma: Continue meds including Dulera through [employer] assistance  [employer]  gain: consider [facility] study DO IT (he is interested in the study  Penile lesions: these appear to be genital warts but not responding to Aldara. w ACA could see other letters in her region otherwise I recommend him going to [facility] to get connected to Dr. Malvin Johns clinic and Research study [facility])  for HRA and potentially other services there

## 2019-10-20 ENCOUNTER — Other Ambulatory Visit: Payer: Self-pay | Admitting: Infectious Disease

## 2019-11-16 ENCOUNTER — Telehealth: Payer: Self-pay

## 2019-11-16 NOTE — Telephone Encounter (Signed)
Patient called office today stating he has clear discharge coming out of his penis. Is concerned it could be UTI or STD. Would like to come into office for testing.  Informed patient that office does not have any open appointments today or next week. Advised to contact HD or visit urgent care for testing/treatment.  Patient verbalized understanding. Robert Stout, [place]

## 2019-11-17 ENCOUNTER — Encounter (HOSPITAL_COMMUNITY): Payer: Self-pay | Admitting: Emergency Medicine

## 2019-11-17 ENCOUNTER — Emergency Department (HOSPITAL_COMMUNITY)
Admission: EM | Admit: 2019-11-17 | Discharge: 2019-11-17 | Disposition: A | Discharge status: Home / Self Care | Payer: Self-pay | Attending: Emergency Medicine | Admitting: Emergency Medicine

## 2019-11-17 DIAGNOSIS — N390 Urinary tract infection, site not specified: Secondary | ICD-10-CM | POA: Insufficient documentation

## 2019-11-17 DIAGNOSIS — Z113 Encounter for screening for infections with a predominantly sexual mode of transmission: Secondary | ICD-10-CM | POA: Insufficient documentation

## 2019-11-17 DIAGNOSIS — Z79899 Other long term (current) drug therapy: Secondary | ICD-10-CM | POA: Insufficient documentation

## 2019-11-17 DIAGNOSIS — Z8616 Personal history of COVID-19: Secondary | ICD-10-CM | POA: Insufficient documentation

## 2019-11-17 LAB — URINALYSIS, ROUTINE W REFLEX MICROSCOPIC
Bilirubin Urine: NEGATIVE
Glucose, UA: NEGATIVE mg/dL
Hgb urine dipstick: NEGATIVE
Ketones, ur: NEGATIVE mg/dL
Nitrite: NEGATIVE
Protein, ur: NEGATIVE mg/dL
Specific Gravity, Urine: 1.021 (ref 1.005–1.030)
WBC, UA: >50 WBC/hpf — ABNORMAL HIGH (ref 0–5)
pH: 6 (ref 5.0–8.0)

## 2019-11-17 MED ORDER — DOXYCYCLINE HYCLATE 100 MG PO TABS
100.0000 mg | ORAL_TABLET | Freq: Once | ORAL | Status: AC
  Administered 2019-11-17: 100 mg via ORAL
  Filled 2019-11-17: qty 1

## 2019-11-17 MED ORDER — CEPHALEXIN 500 MG PO CAPS: 500.0000 mg | ORAL_CAPSULE | Freq: Three times a day (TID) | ORAL | 0 refills | Status: AC

## 2019-11-17 MED ORDER — CEFTRIAXONE SODIUM 500 MG IJ SOLR
500.0000 mg | Freq: Once | INTRAMUSCULAR | Status: AC
  Administered 2019-11-17: 500 mg via INTRAMUSCULAR
  Filled 2019-11-17: qty 500

## 2019-11-17 MED ORDER — DOXYCYCLINE HYCLATE 100 MG PO CAPS: 100.0000 mg | ORAL_CAPSULE | Freq: Two times a day (BID) | ORAL | 0 refills | Status: AC

## 2019-11-17 NOTE — ED Triage Notes (Signed)
Pt. Stated, Ive had penis discharge for a week half.

## 2019-11-17 NOTE — Discharge Instructions (Addendum)
Please take the antibiotics as prescribed.  The doxycycline has been prescribed to cover possible chlamydia infection.  The cephalexin has been prescribed to treat possible urinary tract infection.  Please follow-up with your infectious disease doctor.  Refrain from any sexual activity until 1 week after treatment has been completed.  Notify any sexual partners that you currently have.  Use protection anytime while engaging in sexual activity.

## 2019-11-18 LAB — URINE CULTURE: Culture: NO GROWTH

## 2019-11-18 NOTE — ED Provider Notes (Signed)
[facility] EMERGENCY DEPARTMENT Provider Note   CSN: [id number] Arrival date & time: 11/17/19  1252     History Chief Complaint  Patient presents with  . Penile Discharge    Robert Stout is a 44 y.o. male.  History of HIV, well controlled, no recent missed doses of antivirals.  Reports he was diagnosed with gonorrhea chlamydia back in April.  He received IM Rocephin 500 mg in ER and discharged with doxycycline.  Patient reports that he started taking the doxycycline however he stopped this when his discharge resolved and he did not finish the prescription.  He had no ongoing symptoms up until about 1.5 weeks ago when he noted similar penile discharge.  Discharge is clear, not painful.  He has not noted any ulcers or lesions around his penis or groin.  He has had no sexual encounters since visit in April.  No testicle pain or symptoms. Occasional dysuria, no hematuria.   Follows with ID for his HIV.  HPI     Past Medical History:  Diagnosis Date  . Caries 01/03/2017  . Genital warts 10/28/2016  . 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 HIV infection (HCC) Dx 2011  . Rectal bleeding 10/09/2018  . Weight gain 10/09/2018    Patient Active Problem List   Diagnosis Date Noted  . COVID-19 virus infection 05/23/2019  . BMI 37.0-37.9, adult 10/09/2018  . Caries 01/03/2017  . Genital warts 10/28/2016  . 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 Lung nodule   . HIV disease (HCC) 02/07/2014    Past Surgical History:  Procedure Laterality Date  . TENDON REPAIR         Family History  Problem Relation Age of Onset  . Diabetes Mother   . Hypertension Mother     Social History   Tobacco Use  . Smoking status: Never Smoker  . Smokeless tobacco: Never Used  Substance Use Topics  . Alcohol use: No    Alcohol/week: 0.0 standard drinks  . Drug use: No     Home Medications Prior to Admission medications   Medication Sig Start Date End Date Taking? Authorizing Provider  albuterol (VENTOLIN HFA) 108 (90 Base) MCG/ACT inhaler Inhale 2 puffs into the lungs every 6 (six) hours as needed for wheezing or shortness of breath. 02/28/19   Randall Hiss, MD  allopurinol (ZYLOPRIM) 100 MG tablet Take 1 tablet (100 mg total) by mouth daily. 11/07/18   Claiborne Rigg, NP  bictegravir-emtricitabine-tenofovir AF (BIKTARVY) 50-200-25 MG TABS tablet Take 1 tablet by mouth daily. 10/01/19   Randall Hiss, MD  cephALEXin (KEFLEX) 500 MG capsule Take 1 capsule (500 mg total) by mouth 3 (three) times daily for 7 days. 11/17/19 11/24/19  Milagros Loll, MD  doxycycline (VIBRAMYCIN) 100 MG capsule Take 1 capsule (100 mg total) by mouth 2 (two) times daily for 7 days. 11/17/19 11/24/19  Milagros Loll, MD  Guaifenesin 1200 MG TB12 Take 1 tablet (1,200 mg total) by mouth 2 (two) times daily. 05/20/19   Lawyer, Cristal Deer, PA-C  imiquimod (ALDARA) 5 % cream APPLY CONTENTS OF 1 PACKET TOPICALLY TO THE AFFECTED AREA THREE TIMES PER WEEK 10/22/19   Daiva Eves, Lisette Grinder, MD  loratadine (CLARITIN) 10 MG tablet Take 1 tablet (10 mg total) by mouth daily. 05/17/19   Mayers, Kasandra Knudsen, PA-C  mometasone-formoterol (  DULERA) 100-5 MCG/ACT AERO Inhale 2 puffs into the lungs 2 (two) times daily. 02/28/19   Randall Hiss, MD  promethazine-dextromethorphan (PROMETHAZINE-DM) 6.25-15 MG/5ML syrup Take 5 mLs by mouth 4 (four) times daily as needed for cough. 05/20/19   Lawyer, Cristal Deer, PA-C  valACYclovir (VALTREX) 500 MG tablet Take 1 tablet (500 mg total) by mouth 2 (two) times daily. 1 TABLET DAILY Patient not taking: Reported on 10/01/2019 08/07/18   Daiva Eves, Lisette Grinder, MD    Allergies    Patient has no known allergies.  Review of Systems   Review of Systems  Constitutional: Negative for chills and fever.  HENT: Negative for ear pain and sore throat.    Respiratory: Negative for cough and shortness of breath.   Cardiovascular: Negative for chest pain and palpitations.  Gastrointestinal: Negative for abdominal pain and vomiting.  Endocrine: Negative for polyuria.  Genitourinary: Positive for dysuria. Negative for hematuria, scrotal swelling and testicular pain.  Musculoskeletal: Negative for arthralgias and back pain.  Skin: Negative for color change and rash.  Neurological: Negative for seizures and syncope.  All other systems reviewed and are negative.   Physical Exam Updated Vital Signs BP 118/76 (BP Location: Left Arm)   Pulse 80   Temp 98.6 F (37 C) (Oral)   Resp 20   SpO2 97%   Physical Exam Vitals and nursing note reviewed.  Constitutional:      Appearance: He is well-developed.  HENT:     Head: Normocephalic and atraumatic.  Eyes:     Conjunctiva/sclera: Conjunctivae normal.  Cardiovascular:     Rate and Rhythm: Normal rate.     Heart sounds: No murmur heard.   Pulmonary:     Effort: Pulmonary effort is normal. No respiratory distress.  Abdominal:     Palpations: Abdomen is soft.     Tenderness: There is no abdominal tenderness.  Genitourinary:    Penis: Normal.      Testes: Normal.     Comments: No lesions, ulcer, no TTP of b/l testicle, no discharge noted at tip of penis  Crystal RN chaperone Musculoskeletal:     Cervical back: Neck supple.  Skin:    General: Skin is warm and dry.     Capillary Refill: Capillary refill takes less than 2 seconds.  Neurological:     Mental Status: He is alert.  Psychiatric:        Mood and Affect: Mood normal.        Behavior: Behavior normal.     ED Results / Procedures / Treatments   Labs (all labs ordered are listed, but only abnormal results are displayed) Labs Reviewed  URINALYSIS, ROUTINE W REFLEX MICROSCOPIC - Abnormal; Notable for the following components:      Result Value   APPearance CLOUDY (*)    Leukocytes,Ua LARGE (*)    WBC, UA >50 (*)     Bacteria, UA RARE (*)    Non Squamous Epithelial 0-5 (*)    All other components within normal limits  URINE CULTURE  GC/CHLAMYDIA PROBE AMP ([place]) NOT AT [facility]    EKG None  Radiology No results found.  Procedures Procedures (including critical care time)  Medications Ordered in ED Medications  cefTRIAXone (ROCEPHIN) injection 500 mg (500 mg Intramuscular Given 11/17/19 1518)  doxycycline (VIBRA-TABS) tablet 100 mg (100 mg Oral Given 11/17/19 1517)    ED Course  I have reviewed the triage vital signs and the nursing notes.  Pertinent labs & imaging results  that were available during my care of the patient were reviewed by me and considered in my medical decision making (see chart for details).    MDM Rules/Calculators/A&P                          44 year old male HIV diagnosed with GC chlamydia in April.  Now with recurrent penile discharge and some dysuria.  No ulcers, lesions.  No testicular symptoms.  He reported that he only took part of his prior doxycycline prescription.  He denied new sexual encounters.  Therefore I suspect most likely recurrence of chlamydia due to those missed doses.  Will repeat GC chlamydia testing, will give Rocephin and repeat course of doxycycline.  Patient understands need to finish prescription regardless of his symptoms.  Given the likely recurrent infection, I recommended he follow-up with his infectious disease doctor.  His UA was concerning for infection either UTI or GC chlamydia.  Given his dysuria, will also cover patient with cephalexin to treat possible UTI.    After the discussed management above, the patient was determined to be safe for discharge.  The patient was in agreement with this plan and all questions regarding their care were answered.  ED return precautions were discussed and the patient will return to the ED with any significant worsening of condition.  Final Clinical Impression(s) / ED Diagnoses Final diagnoses:   Urinary tract infection without hematuria, site unspecified  Screening for STDs (sexually transmitted diseases)    Rx / DC Orders ED Discharge Orders         Ordered    doxycycline (VIBRAMYCIN) 100 MG capsule  2 times daily        11/17/19 1509    cephALEXin (KEFLEX) 500 MG capsule  3 times daily        11/17/19 1509           Milagros Loll, MD 11/18/19 [phone]

## 2019-11-19 LAB — GC/CHLAMYDIA PROBE AMP (~~LOC~~) NOT AT ARMC
Chlamydia: NEGATIVE
Comment: NEGATIVE
Comment: NORMAL
Neisseria Gonorrhea: POSITIVE — AB

## 2019-12-21 ENCOUNTER — Ambulatory Visit (INDEPENDENT_AMBULATORY_CARE_PROVIDER_SITE_OTHER): Payer: Self-pay

## 2019-12-21 ENCOUNTER — Other Ambulatory Visit: Payer: Self-pay

## 2019-12-21 DIAGNOSIS — Z23 Encounter for immunization: Secondary | ICD-10-CM

## 2019-12-21 NOTE — Progress Notes (Signed)
   Covid-19 Vaccination Clinic  Name:  Jarek Longton    MRN: [id number] DOB: 08-01-1975  12/21/2019  Mr. Reede was observed post Covid-19 immunization for 15 minutes without incident. He was provided with Vaccine Information Sheet and instruction to access the V-Safe system.   Mr. Merica was instructed to call 911 with any severe reactions post vaccine: Marland Kitchen Difficulty breathing  . Swelling of face and throat  . A fast heartbeat  . A bad rash all over body  . Dizziness and weakness     Rashon Westrup T Pricilla Loveless

## 2020-02-08 ENCOUNTER — Other Ambulatory Visit: Payer: Self-pay | Admitting: Infectious Disease

## 2020-02-18 ENCOUNTER — Other Ambulatory Visit: Payer: Self-pay

## 2020-02-18 DIAGNOSIS — B20 Human immunodeficiency virus [HIV] disease: Secondary | ICD-10-CM

## 2020-02-19 LAB — T-HELPER CELL (CD4) - (RCID CLINIC ONLY)
CD4 % Helper T Cell: 36 % (ref 33–65)
CD4 T Cell Abs: 811 /uL (ref 400–1790)

## 2020-02-29 LAB — COMPLETE METABOLIC PANEL WITH GFR
AG Ratio: 1.5 (calc) (ref 1.0–2.5)
ALT: 21 U/L (ref 9–46)
AST: 22 U/L (ref 10–40)
Albumin: 4.2 g/dL (ref 3.6–5.1)
Alkaline phosphatase (APISO): 57 U/L (ref 36–130)
BUN: 13 mg/dL (ref 7–25)
CO2: 27 mmol/L (ref 20–32)
Calcium: 9.3 mg/dL (ref 8.6–10.3)
Chloride: 104 mmol/L (ref 98–110)
Creat: 1.34 mg/dL (ref 0.60–1.35)
GFR, Est African American: 73 mL/min/{1.73_m2} (ref >=60)
GFR, Est Non African American: 63 mL/min/{1.73_m2} (ref >=60)
Globulin: 2.8 g/dL (calc) (ref 1.9–3.7)
Glucose, Bld: 121 mg/dL — ABNORMAL HIGH (ref 65–99)
Potassium: 4.1 mmol/L (ref 3.5–5.3)
Sodium: 137 mmol/L (ref 135–146)
Total Bilirubin: 0.5 mg/dL (ref 0.2–1.2)
Total Protein: 7 g/dL (ref 6.1–8.1)

## 2020-02-29 LAB — CBC WITH DIFFERENTIAL/PLATELET
Absolute Monocytes: 281 cells/uL (ref 200–950)
Basophils Absolute: 31 cells/uL (ref 0–200)
Basophils Relative: 0.6 %
Eosinophils Absolute: 120 cells/uL (ref 15–500)
Eosinophils Relative: 2.3 %
HCT: 41.2 % (ref 38.5–50.0)
Hemoglobin: 14.3 g/dL (ref 13.2–17.1)
Lymphs Abs: 2345 cells/uL (ref 850–3900)
MCH: 31.8 pg (ref 27.0–33.0)
MCHC: 34.7 g/dL (ref 32.0–36.0)
MCV: 91.6 fL (ref 80.0–100.0)
MPV: 9.7 fL (ref 7.5–12.5)
Monocytes Relative: 5.4 %
Neutro Abs: 2423 cells/uL (ref 1500–7800)
Neutrophils Relative %: 46.6 %
Platelets: 301 10*3/uL (ref 140–400)
RBC: 4.5 10*6/uL (ref 4.20–5.80)
RDW: 12.8 % (ref 11.0–15.0)
Total Lymphocyte: 45.1 %
WBC: 5.2 10*3/uL (ref 3.8–10.8)

## 2020-02-29 LAB — HIV-1 RNA QUANT-NO REFLEX-BLD
HIV 1 RNA Quant: <20 Copies/mL — ABNORMAL HIGH
HIV-1 RNA Quant, Log: <1.3 Log cps/mL — ABNORMAL HIGH

## 2020-02-29 LAB — LIPID PANEL
Cholesterol: 187 mg/dL (ref <200)
HDL: 35 mg/dL — ABNORMAL LOW (ref >=40)
LDL Cholesterol (Calc): 125 mg/dL (calc) — ABNORMAL HIGH
Non-HDL Cholesterol (Calc): 152 mg/dL (calc) — ABNORMAL HIGH (ref <130)
Total CHOL/HDL Ratio: 5.3 (calc) — ABNORMAL HIGH (ref <5.0)
Triglycerides: 158 mg/dL — ABNORMAL HIGH (ref <150)

## 2020-02-29 LAB — RPR: RPR Ser Ql: NONREACTIVE

## 2020-03-04 ENCOUNTER — Other Ambulatory Visit: Payer: Self-pay

## 2020-03-04 ENCOUNTER — Ambulatory Visit (INDEPENDENT_AMBULATORY_CARE_PROVIDER_SITE_OTHER): Payer: Self-pay | Admitting: Infectious Disease

## 2020-03-04 VITALS — BP 114/76 | HR 85 | Temp 98.0°F | Wt 281.0 lb

## 2020-03-04 DIAGNOSIS — J45909 Unspecified asthma, uncomplicated: Secondary | ICD-10-CM | POA: Insufficient documentation

## 2020-03-04 DIAGNOSIS — U071 COVID-19: Secondary | ICD-10-CM

## 2020-03-04 DIAGNOSIS — B20 Human immunodeficiency virus [HIV] disease: Secondary | ICD-10-CM

## 2020-03-04 DIAGNOSIS — Z6837 Body mass index (BMI) 37.0-37.9, adult: Secondary | ICD-10-CM

## 2020-03-04 DIAGNOSIS — A630 Anogenital (venereal) warts: Secondary | ICD-10-CM

## 2020-03-04 DIAGNOSIS — B009 Herpesviral infection, unspecified: Secondary | ICD-10-CM

## 2020-03-04 NOTE — Progress Notes (Signed)
Subjective:   Chief complaint followup for HIV on meds and concerned about overgrown toenails  :  Patient ID: Robert Stout, male    DOB: 1975/12/20, 45 y.o.   MRN: [id number]     Robert Stout is a 45 year-old African-American man living with HIV that is currently well controlled.  He did have some trouble with homelessness but now has stable living.  Suffered from genital warts which we have prescribed Aldara without much response.  Of offered to have him seen at [facility] infectious disease where they can do HRA testing. He claims to not have their number though notes from [facility] indicated that he did. I was going to give him number again before he left clinic but did not do so.  He has gained weight on antegrade strand transfer inhibitor and was interested in the DO-IT study from [facility] but is not a candidate.      Past Medical History:  Diagnosis Date  . Caries 01/03/2017  . Genital warts 10/28/2016  . 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 HIV infection (HCC) Dx 2011  . Rectal bleeding 10/09/2018  . Weight gain 10/09/2018    Past Surgical History:  Procedure Laterality Date  . TENDON REPAIR      Family History  Problem Relation Age of Onset  . Diabetes Mother   . Hypertension Mother       Social History   Socioeconomic History  . Marital status: Single    Spouse name: Not on file  . Number of children: Not on file  . Years of education: Not on file  . Highest education level: Not on file  Occupational History  . Not on file  Tobacco Use  . Smoking status: Never Smoker  . Smokeless tobacco: Never Used  Substance and Sexual Activity  . Alcohol use: No    Alcohol/week: 0.0 standard drinks  . Drug use: No  . Sexual activity: Not Currently  Other Topics Concern  . Not on file  Social History Narrative  . Not on file   Social Determinants of Health   Financial Resource Strain: Not on file  Food  Insecurity: Not on file  Transportation Needs: Not on file  Physical Activity: Not on file  Stress: Not on file  Social Connections: Not on file    No Known Allergies   Current Outpatient Medications:  .  albuterol (VENTOLIN HFA) 108 (90 Base) MCG/ACT inhaler, Inhale 2 puffs into the lungs every 6 (six) hours as needed for wheezing or shortness of breath., Disp: 18 g, Rfl: 5 .  allopurinol (ZYLOPRIM) 100 MG tablet, Take 1 tablet (100 mg total) by mouth daily., Disp: 90 tablet, Rfl: 2 .  bictegravir-emtricitabine-tenofovir AF (BIKTARVY) 50-200-25 MG TABS tablet, Take 1 tablet by mouth daily., Disp: 30 tablet, Rfl: 11 .  imiquimod (ALDARA) 5 % cream, APPLY CONTENTS OF 1 PACKET TOPICALLY TO THE AFFECTED AREA THREE TIMES PER WEEK, Disp: 12 each, Rfl: 3 .  mometasone-formoterol (DULERA) 100-5 MCG/ACT AERO, Inhale 2 puffs into the lungs 2 (two) times daily., Disp: 13 g, Rfl: 11 .  Guaifenesin 1200 MG TB12, Take 1 tablet (1,200 mg total) by mouth 2 (two) times daily. (Patient not taking: Reported on 03/04/2020), Disp: 20 tablet, Rfl: 0 .  loratadine (CLARITIN) 10 MG tablet, Take 1 tablet (10 mg total) by mouth daily. (Patient not taking: Reported on 03/04/2020), Disp: 30 tablet, Rfl: 11 .  promethazine-dextromethorphan (  PROMETHAZINE-DM) 6.25-15 MG/5ML syrup, Take 5 mLs by mouth 4 (four) times daily as needed for cough., Disp: 200 mL, Rfl: 0 .  valACYclovir (VALTREX) 500 MG tablet, Take 1 tablet (500 mg total) by mouth 2 (two) times daily. 1 TABLET DAILY (Patient not taking: No sig reported), Disp: 14 tablet, Rfl: 0    Review of Systems  Constitutional: Negative for chills and fever.  HENT: Negative for congestion, dental problem and sore throat.   Eyes: Negative for photophobia.  Respiratory: Negative for cough, shortness of breath and wheezing.   Cardiovascular: Negative for chest pain, palpitations and leg swelling.  Gastrointestinal: Negative for abdominal pain, blood in stool, constipation,  diarrhea, nausea and vomiting.  Genitourinary: Negative for dysuria, flank pain and hematuria.  Musculoskeletal: Negative for arthralgias, back pain and myalgias.  Skin: Negative for rash.  Neurological: Negative for dizziness, weakness and headaches.  Hematological: Does not bruise/bleed easily.  Psychiatric/Behavioral: Negative for agitation, behavioral problems, confusion, decreased concentration and suicidal ideas.       Objective:   Physical Exam Vitals and nursing note reviewed.  Constitutional:      General: He is not in acute distress.    Appearance: He is well-developed. He is not diaphoretic.  HENT:     Head: Normocephalic and atraumatic.     Mouth/Throat:     Pharynx: No oropharyngeal exudate.  Eyes:     General: No scleral icterus.    Conjunctiva/sclera: Conjunctivae normal.  Cardiovascular:     Rate and Rhythm: Normal rate and regular rhythm.     Heart sounds: No friction rub.  Pulmonary:     Effort: Pulmonary effort is normal. No respiratory distress.     Breath sounds: No wheezing or rales.  Abdominal:     General: There is no distension.     Palpations: Abdomen is soft.  Musculoskeletal:        General: No tenderness.     Cervical back: Normal range of motion and neck supple.  Skin:    General: Skin is warm and dry.     Coloration: Skin is not pale.     Findings: No erythema or rash.  Neurological:     Mental Status: He is alert and oriented to person, place, and time.     Motor: No abnormal muscle tone.     Coordination: Coordination normal.  Psychiatric:        Attention and Perception: Attention and perception normal.        Mood and Affect: Mood is depressed.        Behavior: Behavior normal.        Thought Content: Thought content normal.        Judgment: Judgment normal.           Assessment & Plan:   HIV and AIDS: considered change to DOR + FTC?TDF vs FTC/TAF But he wishes to stay on Biktarvy  Asthma: Continue meds including Dulera  through [employer] assistance  Weight gain: referral to weight loss clinic  Penile lesions: should get plugged in with ANCHOR study at [facility] with Dr Madaline Brilliant  Overgrown toenails: can refer to Podiatry but given lack of insurance he is not eager to go yet  Lack of insurance: he should used [facility] to do ICAP and get an ACA plan to give him access to other services outside of [facility] and [facility]  HSV: continue valtrex

## 2020-03-12 ENCOUNTER — Ambulatory Visit: Payer: Self-pay

## 2020-03-17 ENCOUNTER — Other Ambulatory Visit: Payer: Self-pay

## 2020-03-17 ENCOUNTER — Encounter: Payer: Self-pay | Admitting: Infectious Disease

## 2020-03-17 ENCOUNTER — Ambulatory Visit: Payer: Self-pay

## 2020-07-10 ENCOUNTER — Ambulatory Visit: Payer: Self-pay

## 2020-07-10 ENCOUNTER — Other Ambulatory Visit: Payer: Self-pay

## 2020-07-17 ENCOUNTER — Ambulatory Visit: Payer: Self-pay | Admitting: Podiatry

## 2020-08-04 ENCOUNTER — Other Ambulatory Visit: Payer: Self-pay | Admitting: Family

## 2020-08-04 DIAGNOSIS — B20 Human immunodeficiency virus [HIV] disease: Secondary | ICD-10-CM

## 2020-08-18 ENCOUNTER — Other Ambulatory Visit: Payer: Self-pay

## 2020-08-18 ENCOUNTER — Other Ambulatory Visit: Payer: 59

## 2020-08-18 DIAGNOSIS — B20 Human immunodeficiency virus [HIV] disease: Secondary | ICD-10-CM

## 2020-08-19 LAB — T-HELPER CELL (CD4) - (RCID CLINIC ONLY)
CD4 % Helper T Cell: 33 % (ref 33–65)
CD4 T Cell Abs: 876 /uL (ref 400–1790)

## 2020-08-20 LAB — CBC WITH DIFFERENTIAL/PLATELET
Absolute Monocytes: 442 cells/uL (ref 200–950)
Basophils Absolute: 50 cells/uL (ref 0–200)
Basophils Relative: 0.9 %
Eosinophils Absolute: 151 cells/uL (ref 15–500)
Eosinophils Relative: 2.7 %
HCT: 42.4 % (ref 38.5–50.0)
Hemoglobin: 14.2 g/dL (ref 13.2–17.1)
Lymphs Abs: 2794 cells/uL (ref 850–3900)
MCH: 31 pg (ref 27.0–33.0)
MCHC: 33.5 g/dL (ref 32.0–36.0)
MCV: 92.6 fL (ref 80.0–100.0)
MPV: 9.4 fL (ref 7.5–12.5)
Monocytes Relative: 7.9 %
Neutro Abs: 2162 cells/uL (ref 1500–7800)
Neutrophils Relative %: 38.6 %
Platelets: 302 10*3/uL (ref 140–400)
RBC: 4.58 10*6/uL (ref 4.20–5.80)
RDW: 12.9 % (ref 11.0–15.0)
Total Lymphocyte: 49.9 %
WBC: 5.6 10*3/uL (ref 3.8–10.8)

## 2020-08-20 LAB — COMPLETE METABOLIC PANEL WITH GFR
AG Ratio: 1.4 (calc) (ref 1.0–2.5)
ALT: 18 U/L (ref 9–46)
AST: 23 U/L (ref 10–40)
Albumin: 4.2 g/dL (ref 3.6–5.1)
Alkaline phosphatase (APISO): 63 U/L (ref 36–130)
BUN: 11 mg/dL (ref 7–25)
CO2: 27 mmol/L (ref 20–32)
Calcium: 9.1 mg/dL (ref 8.6–10.3)
Chloride: 104 mmol/L (ref 98–110)
Creat: 1.16 mg/dL (ref 0.60–1.35)
GFR, Est African American: 86 mL/min/{1.73_m2} (ref >=60)
GFR, Est Non African American: 75 mL/min/{1.73_m2} (ref >=60)
Globulin: 2.9 g/dL (calc) (ref 1.9–3.7)
Glucose, Bld: 108 mg/dL — ABNORMAL HIGH (ref 65–99)
Potassium: 4.3 mmol/L (ref 3.5–5.3)
Sodium: 137 mmol/L (ref 135–146)
Total Bilirubin: 0.4 mg/dL (ref 0.2–1.2)
Total Protein: 7.1 g/dL (ref 6.1–8.1)

## 2020-08-20 LAB — LIPID PANEL
Cholesterol: 183 mg/dL (ref <200)
HDL: 34 mg/dL — ABNORMAL LOW (ref >=40)
LDL Cholesterol (Calc): 127 mg/dL (calc) — ABNORMAL HIGH
Non-HDL Cholesterol (Calc): 149 mg/dL (calc) — ABNORMAL HIGH (ref <130)
Total CHOL/HDL Ratio: 5.4 (calc) — ABNORMAL HIGH (ref <5.0)
Triglycerides: 112 mg/dL (ref <150)

## 2020-08-20 LAB — HIV-1 RNA QUANT-NO REFLEX-BLD
HIV 1 RNA Quant: NOT DETECTED Copies/mL
HIV-1 RNA Quant, Log: NOT DETECTED Log cps/mL

## 2020-08-20 LAB — RPR: RPR Ser Ql: NONREACTIVE

## 2020-09-01 ENCOUNTER — Encounter: Payer: Self-pay | Admitting: Infectious Disease

## 2020-09-01 ENCOUNTER — Other Ambulatory Visit: Payer: Self-pay

## 2020-09-01 ENCOUNTER — Ambulatory Visit (INDEPENDENT_AMBULATORY_CARE_PROVIDER_SITE_OTHER): Payer: 59 | Admitting: Infectious Disease

## 2020-09-01 ENCOUNTER — Ambulatory Visit: Payer: 59

## 2020-09-01 ENCOUNTER — Other Ambulatory Visit: Payer: Self-pay | Admitting: Nurse Practitioner

## 2020-09-01 ENCOUNTER — Encounter: Payer: Self-pay | Admitting: Nurse Practitioner

## 2020-09-01 VITALS — BP 135/85 | HR 82 | Temp 98.0°F | Wt 280.0 lb

## 2020-09-01 DIAGNOSIS — B009 Herpesviral infection, unspecified: Secondary | ICD-10-CM

## 2020-09-01 DIAGNOSIS — A630 Anogenital (venereal) warts: Secondary | ICD-10-CM

## 2020-09-01 DIAGNOSIS — J4520 Mild intermittent asthma, uncomplicated: Secondary | ICD-10-CM

## 2020-09-01 DIAGNOSIS — U071 COVID-19: Secondary | ICD-10-CM

## 2020-09-01 DIAGNOSIS — Z6837 Body mass index (BMI) 37.0-37.9, adult: Secondary | ICD-10-CM | POA: Diagnosis not present

## 2020-09-01 DIAGNOSIS — R739 Hyperglycemia, unspecified: Secondary | ICD-10-CM | POA: Insufficient documentation

## 2020-09-01 DIAGNOSIS — B20 Human immunodeficiency virus [HIV] disease: Secondary | ICD-10-CM | POA: Diagnosis not present

## 2020-09-01 DIAGNOSIS — J302 Other seasonal allergic rhinitis: Secondary | ICD-10-CM

## 2020-09-01 DIAGNOSIS — J209 Acute bronchitis, unspecified: Secondary | ICD-10-CM

## 2020-09-01 DIAGNOSIS — J45909 Unspecified asthma, uncomplicated: Secondary | ICD-10-CM

## 2020-09-01 HISTORY — DX: Hyperglycemia, unspecified: R73.9

## 2020-09-01 MED ORDER — LORATADINE 10 MG PO TABS
10.0000 mg | ORAL_TABLET | Freq: Every day | ORAL | 11 refills | Status: AC
  Filled 2020-09-01: qty 30, 30d supply, fill #0

## 2020-09-01 MED ORDER — BIKTARVY 50-200-25 MG PO TABS: 1.0000 | ORAL_TABLET | Freq: Every day | ORAL | 11 refills | Status: DC

## 2020-09-01 MED ORDER — ALBUTEROL SULFATE HFA 108 (90 BASE) MCG/ACT IN AERS
2.0000 | INHALATION_SPRAY | Freq: Four times a day (QID) | RESPIRATORY_TRACT | 1 refills | Status: DC | PRN
  Filled 2020-09-01: qty 18, 25d supply, fill #0

## 2020-09-01 MED ORDER — ALLOPURINOL 100 MG PO TABS
100.0000 mg | ORAL_TABLET | Freq: Every day | ORAL | 2 refills | Status: DC
  Filled 2020-09-01: qty 30, 30d supply, fill #0

## 2020-09-01 MED ORDER — DULERA 100-5 MCG/ACT IN AERO
2.0000 | INHALATION_SPRAY | Freq: Two times a day (BID) | RESPIRATORY_TRACT | 11 refills | Status: DC
  Filled 2020-09-01: qty 13, 25d supply, fill #0

## 2020-09-01 NOTE — Progress Notes (Signed)
Subjective:   Chief complaint followup for HIV on meds  :  Patient ID: Robert Stout, male    DOB: May 05, 1975, 45 y.o.   MRN: [id number]     Robert Stout is a 45 year-old African-American man living with HIV that is currently well controlled.    Suffered from genital warts which we have prescribed Aldara without much response.  Of offered to have him seen at [facility] infectious disease where they can do HRA testing. He claims to not have their number though notes from [facility] indicated that he did.   Another referral today to them.  He was also followed by Bertram Denver with [place] and [facility] for primary care.  He has not seen her since 2020.  He needs to re-establish with her for primary care esp since medicines such as for his asthma are not covered by the HIV medication assistance program. (He was on inhaled Dulera through [employer])  Is still having asthma flares with the same frequency though not as frequently as in the past he is not on his medications to prevent gout anymore.  He is try to make her to lose weight through carbohydrate restriction.       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   HIV infection (HCC) Dx 2011   Rectal bleeding 10/09/2018   Weight gain 10/09/2018    Past Surgical History:  Procedure Laterality Date   TENDON REPAIR      Family History  Problem Relation Age of Onset   Diabetes Mother    Hypertension Mother       Social History   Socioeconomic History   Marital status: Single    Spouse name: Not on file   Number of children: Not on file   Years of education: Not on file   Highest education level: Not on file  Occupational History   Not on file  Tobacco Use   Smoking status: Never   Smokeless tobacco: Never  Substance and Sexual Activity   Alcohol use: No    Alcohol/week: 0.0  standard drinks   Drug use: No   Sexual activity: Not Currently  Other Topics Concern   Not on file  Social History Narrative   Not on file   Social Determinants of Health   Financial Resource Strain: Not on file  Food Insecurity: Not on file  Transportation Needs: Not on file  Physical Activity: Not on file  Stress: Not on file  Social Connections: Not on file    No Known Allergies   Current Outpatient Medications:    albuterol (VENTOLIN HFA) 108 (90 Base) MCG/ACT inhaler, Inhale 2 puffs into the lungs every 6 (six) hours as needed for wheezing or shortness of breath., Disp: 18 g, Rfl: 5   allopurinol (ZYLOPRIM) 100 MG tablet, Take 1 tablet (100 mg total) by mouth daily., Disp: 90 tablet, Rfl: 2   bictegravir-emtricitabine-tenofovir AF (BIKTARVY) 50-200-25 MG TABS tablet, Take 1 tablet by mouth daily., Disp: 30 tablet, Rfl: 11   Guaifenesin 1200 MG TB12, Take 1 tablet (1,200 mg total) by mouth 2 (two) times daily. (Patient not taking: Reported on 03/04/2020), Disp: 20 tablet, Rfl: 0   imiquimod (ALDARA) 5 % cream, APPLY CONTENTS OF 1 PACKET TOPICALLY TO THE AFFECTED AREA THREE TIMES PER WEEK, Disp: 12 each, Rfl: 3   loratadine (  CLARITIN) 10 MG tablet, Take 1 tablet (10 mg total) by mouth daily. (Patient not taking: Reported on 03/04/2020), Disp: 30 tablet, Rfl: 11   mometasone-formoterol (DULERA) 100-5 MCG/ACT AERO, Inhale 2 puffs into the lungs 2 (two) times daily., Disp: 13 g, Rfl: 11   promethazine-dextromethorphan (PROMETHAZINE-DM) 6.25-15 MG/5ML syrup, Take 5 mLs by mouth 4 (four) times daily as needed for cough., Disp: 200 mL, Rfl: 0   valACYclovir (VALTREX) 500 MG tablet, Take 1 tablet (500 mg total) by mouth 2 (two) times daily. 1 TABLET DAILY (Patient not taking: No sig reported), Disp: 14 tablet, Rfl: 0    Review of Systems  Constitutional:  Negative for activity change, appetite change, chills, diaphoresis, fatigue, fever and unexpected weight change.  HENT:  Negative  for congestion, rhinorrhea, sinus pressure, sneezing, sore throat and trouble swallowing.   Eyes:  Negative for photophobia and visual disturbance.  Respiratory:  Negative for cough, chest tightness, shortness of breath, wheezing and stridor.   Cardiovascular:  Negative for chest pain, palpitations and leg swelling.  Gastrointestinal:  Negative for abdominal distention, abdominal pain, anal bleeding, blood in stool, constipation, diarrhea, nausea and vomiting.  Genitourinary:  Negative for difficulty urinating, dysuria, flank pain and hematuria.  Musculoskeletal:  Negative for arthralgias, back pain, gait problem, joint swelling and myalgias.  Skin:  Negative for color change, pallor, rash and wound.  Neurological:  Negative for dizziness, tremors, weakness and light-headedness.  Hematological:  Negative for adenopathy. Does not bruise/bleed easily.  Psychiatric/Behavioral:  Negative for agitation, behavioral problems, confusion, decreased concentration, dysphoric mood and sleep disturbance.       Objective:   Physical Exam Constitutional:      Appearance: He is well-developed. He is obese.  HENT:     Head: Normocephalic and atraumatic.  Eyes:     Conjunctiva/sclera: Conjunctivae normal.  Cardiovascular:     Rate and Rhythm: Normal rate and regular rhythm.  Pulmonary:     Effort: Pulmonary effort is normal. No respiratory distress.     Breath sounds: No wheezing.  Abdominal:     General: There is no distension.     Palpations: Abdomen is soft.  Musculoskeletal:        General: No tenderness. Normal range of motion.     Cervical back: Normal range of motion and neck supple.  Skin:    General: Skin is warm and dry.     Coloration: Skin is not pale.     Findings: No erythema or rash.  Neurological:     General: No focal deficit present.     Mental Status: He is alert and oriented to person, place, and time.  Psychiatric:        Mood and Affect: Mood normal.        Behavior:  Behavior normal.        Thought Content: Thought content normal.        Judgment: Judgment normal.          Assessment & Plan:   HIV and AIDS: Perfectly controlled on Biktarvy does not want to change medicines in the past he was on Atripla it sounds like while he was incarcerated before being changed to dolutegravir Truvada and then BIKTARVY    Asthma: To be able to have access to beta agonist therapy and preventative inhaled corticosteroid therapy reaching out to our pharmacy tech to see about assistance programs and also to his primary care provider Bertram Denver at [facility] and wellness  Weight gain: Continue to  work on weight loss through carbohydrate restriction   Penile lesions: Furred back to [facility] so he can be seen by their infectious disease clinic which has HRA capability.   HSV: continue valtrex

## 2020-09-01 NOTE — Addendum Note (Signed)
Addended by: Linna Hoff D on: 09/01/2020 01:48 PM   Modules accepted: Orders

## 2020-09-01 NOTE — Addendum Note (Signed)
Addended by: Harley Alto on: 09/01/2020 12:13 PM   Modules accepted: Orders

## 2020-09-04 ENCOUNTER — Other Ambulatory Visit: Payer: Self-pay

## 2020-09-08 ENCOUNTER — Other Ambulatory Visit: Payer: Self-pay

## 2020-09-10 ENCOUNTER — Other Ambulatory Visit: Payer: Self-pay

## 2020-09-10 ENCOUNTER — Ambulatory Visit: Payer: 59

## 2020-09-12 ENCOUNTER — Other Ambulatory Visit: Payer: Self-pay

## 2020-09-12 ENCOUNTER — Ambulatory Visit: Payer: 59

## 2020-09-16 ENCOUNTER — Ambulatory Visit: Payer: 59

## 2020-09-16 ENCOUNTER — Encounter: Payer: Self-pay | Admitting: Infectious Disease

## 2020-09-16 ENCOUNTER — Other Ambulatory Visit: Payer: Self-pay

## 2020-10-17 ENCOUNTER — Ambulatory Visit: Payer: 59 | Admitting: Nurse Practitioner

## 2021-03-18 ENCOUNTER — Other Ambulatory Visit: Payer: Self-pay

## 2021-03-18 ENCOUNTER — Other Ambulatory Visit: Payer: 59

## 2021-03-18 DIAGNOSIS — B20 Human immunodeficiency virus [HIV] disease: Secondary | ICD-10-CM

## 2021-03-19 LAB — T-HELPER CELL (CD4) - (RCID CLINIC ONLY)
CD4 % Helper T Cell: 36 % (ref 33–65)
CD4 T Cell Abs: 997 /uL (ref 400–1790)

## 2021-03-21 LAB — COMPLETE METABOLIC PANEL WITH GFR
AG Ratio: 1.4 (calc) (ref 1.0–2.5)
ALT: 18 U/L (ref 9–46)
AST: 21 U/L (ref 10–40)
Albumin: 4 g/dL (ref 3.6–5.1)
Alkaline phosphatase (APISO): 60 U/L (ref 36–130)
BUN/Creatinine Ratio: 8 (calc) (ref 6–22)
BUN: 11 mg/dL (ref 7–25)
CO2: 30 mmol/L (ref 20–32)
Calcium: 9.3 mg/dL (ref 8.6–10.3)
Chloride: 104 mmol/L (ref 98–110)
Creat: 1.38 mg/dL — ABNORMAL HIGH (ref 0.60–1.29)
Globulin: 2.9 g/dL (calc) (ref 1.9–3.7)
Glucose, Bld: 111 mg/dL — ABNORMAL HIGH (ref 65–99)
Potassium: 4 mmol/L (ref 3.5–5.3)
Sodium: 138 mmol/L (ref 135–146)
Total Bilirubin: 0.4 mg/dL (ref 0.2–1.2)
Total Protein: 6.9 g/dL (ref 6.1–8.1)
eGFR: 63 mL/min/{1.73_m2} (ref >=60)

## 2021-03-21 LAB — CBC WITH DIFFERENTIAL/PLATELET
Absolute Monocytes: 433 cells/uL (ref 200–950)
Basophils Absolute: 63 cells/uL (ref 0–200)
Basophils Relative: 1.1 %
Eosinophils Absolute: 222 cells/uL (ref 15–500)
Eosinophils Relative: 3.9 %
HCT: 42.4 % (ref 38.5–50.0)
Hemoglobin: 14.1 g/dL (ref 13.2–17.1)
Lymphs Abs: 3152 cells/uL (ref 850–3900)
MCH: 30.6 pg (ref 27.0–33.0)
MCHC: 33.3 g/dL (ref 32.0–36.0)
MCV: 92 fL (ref 80.0–100.0)
MPV: 9.9 fL (ref 7.5–12.5)
Monocytes Relative: 7.6 %
Neutro Abs: 1830 cells/uL (ref 1500–7800)
Neutrophils Relative %: 32.1 %
Platelets: 298 10*3/uL (ref 140–400)
RBC: 4.61 10*6/uL (ref 4.20–5.80)
RDW: 12.5 % (ref 11.0–15.0)
Total Lymphocyte: 55.3 %
WBC: 5.7 10*3/uL (ref 3.8–10.8)

## 2021-03-21 LAB — HIV-1 RNA QUANT-NO REFLEX-BLD
HIV 1 RNA Quant: <20 Copies/mL — ABNORMAL HIGH
HIV-1 RNA Quant, Log: <1.3 Log cps/mL — ABNORMAL HIGH

## 2021-03-21 LAB — LIPID PANEL
Cholesterol: 184 mg/dL (ref <200)
HDL: 33 mg/dL — ABNORMAL LOW (ref >=40)
LDL Cholesterol (Calc): 123 mg/dL (calc) — ABNORMAL HIGH
Non-HDL Cholesterol (Calc): 151 mg/dL (calc) — ABNORMAL HIGH (ref <130)
Total CHOL/HDL Ratio: 5.6 (calc) — ABNORMAL HIGH (ref <5.0)
Triglycerides: 160 mg/dL — ABNORMAL HIGH (ref <150)

## 2021-03-21 LAB — RPR: RPR Ser Ql: NONREACTIVE

## 2021-04-05 ENCOUNTER — Encounter: Payer: Self-pay | Admitting: Infectious Disease

## 2021-04-05 DIAGNOSIS — Z7185 Encounter for immunization safety counseling: Secondary | ICD-10-CM

## 2021-04-05 HISTORY — DX: Encounter for immunization safety counseling: Z71.85

## 2021-04-05 NOTE — Progress Notes (Signed)
Subjective:   Chief complaint: Follow-up for HIV disease off medications due to problems with his [employer] Rx and meds being filled at [facility] in [place] wants :  Patient ID: Robert Stout, male    DOB: 02-27-1975, 46 y.o.   MRN: [id number]     Robert Stout is a 46 year-old African-American man living with HIV that is typically been well controlled.  More recently he has been given $100 co-pay charge at [facility] on [address] which he should not be getting since he is having his insurance funded through federal program P And the remaining of cost is supposed to be picked up by the government.  I have given him 1 month supply of Biktarvy but he needs to have this straightened out by our Development worker, community and [facility] in [place].  He is a bit worried also about his sugars having run higher when he is had them checked in the clinic though he had eaten a glucose containing meal before his last blood draw.      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   HIV infection ([place]) Dx 2011   Hyperglycemia 09/01/2020   Rectal bleeding 10/09/2018   Weight gain 10/09/2018    Past Surgical History:  Procedure Laterality Date   TENDON REPAIR      Family History  Problem Relation Age of Onset   Diabetes Mother    Hypertension Mother       Social History   Socioeconomic History   Marital status: Single    Spouse name: Not on file   Number of children: Not on file   Years of education: Not on file   Highest education level: Not on file  Occupational History   Not on file  Tobacco Use   Smoking status: Never   Smokeless tobacco: Never  Substance and Sexual Activity   Alcohol use: No    Alcohol/week: 0.0 standard drinks   Drug use: No   Sexual activity: Not Currently  Other Topics Concern   Not on file  Social History Narrative   Not on file    Social Determinants of Health   Financial Resource Strain: Not on file  Food Insecurity: Not on file  Transportation Needs: Not on file  Physical Activity: Not on file  Stress: Not on file  Social Connections: Not on file    No Known Allergies   Current Outpatient Medications:    albuterol (VENTOLIN HFA) 108 (90 Base) MCG/ACT inhaler, Inhale 2 puffs into the lungs every 6 (six) hours as needed for wheezing or shortness of breath., Disp: 18 g, Rfl: 1   allopurinol (ZYLOPRIM) 100 MG tablet, Take 1 tablet (100 mg total) by mouth daily., Disp: 90 tablet, Rfl: 2   bictegravir-emtricitabine-tenofovir AF (BIKTARVY) 50-200-25 MG TABS tablet, Take 1 tablet by mouth daily., Disp: 30 tablet, Rfl: 11   imiquimod (ALDARA) 5 % cream, APPLY CONTENTS OF 1 PACKET TOPICALLY TO THE AFFECTED AREA THREE TIMES PER WEEK (Patient not taking: Reported on 09/01/2020), Disp: 12 each, Rfl: 3   loratadine (CLARITIN) 10 MG tablet, Take 1 tablet (10 mg total) by mouth daily., Disp: 30 tablet, Rfl: 11   mometasone-formoterol (DULERA) 100-5 MCG/ACT AERO, Inhale 2 puffs into the lungs 2 (two) times daily. NEEDS PASS, Disp: 13 g, Rfl: 11   valACYclovir (VALTREX) 500 MG tablet, Take 1 tablet (  500 mg total) by mouth 2 (two) times daily. 1 TABLET DAILY (Patient not taking: No sig reported), Disp: 14 tablet, Rfl: 0    Review of Systems  Constitutional:  Negative for activity change, appetite change, chills, diaphoresis, fatigue, fever and unexpected weight change.  HENT:  Negative for congestion, rhinorrhea, sinus pressure, sneezing, sore throat and trouble swallowing.   Eyes:  Negative for photophobia and visual disturbance.  Respiratory:  Negative for cough, chest tightness, shortness of breath, wheezing and stridor.   Cardiovascular:  Negative for chest pain, palpitations and leg swelling.  Gastrointestinal:  Negative for abdominal distention, abdominal pain, anal bleeding, blood in stool, constipation, diarrhea,  nausea and vomiting.  Genitourinary:  Negative for difficulty urinating, dysuria, flank pain and hematuria.  Musculoskeletal:  Negative for arthralgias, back pain, gait problem, joint swelling and myalgias.  Skin:  Negative for color change, pallor, rash and wound.  Neurological:  Negative for dizziness, tremors, weakness, light-headedness and headaches.  Hematological:  Negative for adenopathy. Does not bruise/bleed easily.  Psychiatric/Behavioral:  Negative for agitation, behavioral problems, confusion, decreased concentration, dysphoric mood, sleep disturbance and suicidal ideas.       Objective:   Physical Exam Constitutional:      Appearance: He is well-developed. He is obese.  HENT:     Head: Normocephalic and atraumatic.  Eyes:     Conjunctiva/sclera: Conjunctivae normal.  Cardiovascular:     Rate and Rhythm: Normal rate and regular rhythm.  Pulmonary:     Effort: Pulmonary effort is normal. No respiratory distress.     Breath sounds: No wheezing.  Abdominal:     General: There is no distension.     Palpations: Abdomen is soft.  Musculoskeletal:        General: No tenderness. Normal range of motion.     Cervical back: Normal range of motion and neck supple.  Skin:    General: Skin is warm and dry.     Coloration: Skin is not pale.     Findings: No erythema or rash.  Neurological:     General: No focal deficit present.     Mental Status: He is alert and oriented to person, place, and time.  Psychiatric:        Mood and Affect: Mood normal.        Behavior: Behavior normal.        Thought Content: Thought content normal.        Judgment: Judgment normal.          Assessment & Plan:   HIV disease:  I have reviewed his VL from 03/18/2021 which was <20  and CD4 from same date taht was 997  Lab Results  Component Value Date   HIV1RNAQUANT <20 (H) 03/18/2021   Lab Results  Component Value Date   CD4TABS 997 03/18/2021   CD4TABS 876 08/18/2020   CD4TABS 811  02/18/2020    I am continuing his Biktarvy trypsin which [facility] [place] and I sent gave him a 4-week supply as well through samples  Asthma: He should continue on albuterol and inhaled dulera  HSV:  I am continuing his valtrex rx and his BMP shows stable creattnine on labs reviewed from 03/18/21  Weight gain: Continue work on carbohydrate restriction  Hyperglycemia: Concern regarding diabetes we will screen for diabetes with an A1c  Vaccine counseling:  Recommended seasonal flu shot, prevnar 20 and updated COVID 19 booster he agreed to all 3. \

## 2021-04-06 ENCOUNTER — Other Ambulatory Visit (HOSPITAL_COMMUNITY): Payer: Self-pay

## 2021-04-06 ENCOUNTER — Ambulatory Visit (INDEPENDENT_AMBULATORY_CARE_PROVIDER_SITE_OTHER): Payer: 59 | Admitting: Infectious Disease

## 2021-04-06 ENCOUNTER — Encounter: Payer: Self-pay | Admitting: Infectious Disease

## 2021-04-06 ENCOUNTER — Other Ambulatory Visit: Payer: Self-pay

## 2021-04-06 ENCOUNTER — Ambulatory Visit (INDEPENDENT_AMBULATORY_CARE_PROVIDER_SITE_OTHER): Payer: 59

## 2021-04-06 VITALS — BP 111/85 | HR 74 | Temp 98.4°F | Wt 283.0 lb

## 2021-04-06 DIAGNOSIS — B009 Herpesviral infection, unspecified: Secondary | ICD-10-CM | POA: Diagnosis not present

## 2021-04-06 DIAGNOSIS — A630 Anogenital (venereal) warts: Secondary | ICD-10-CM | POA: Diagnosis not present

## 2021-04-06 DIAGNOSIS — Z7185 Encounter for immunization safety counseling: Secondary | ICD-10-CM

## 2021-04-06 DIAGNOSIS — Z6837 Body mass index (BMI) 37.0-37.9, adult: Secondary | ICD-10-CM

## 2021-04-06 DIAGNOSIS — Z23 Encounter for immunization: Secondary | ICD-10-CM

## 2021-04-06 DIAGNOSIS — B20 Human immunodeficiency virus [HIV] disease: Secondary | ICD-10-CM | POA: Diagnosis not present

## 2021-04-06 DIAGNOSIS — R739 Hyperglycemia, unspecified: Secondary | ICD-10-CM

## 2021-04-06 DIAGNOSIS — J45909 Unspecified asthma, uncomplicated: Secondary | ICD-10-CM | POA: Diagnosis not present

## 2021-04-06 MED ORDER — BIKTARVY 50-200-25 MG PO TABS: 1.0000 | ORAL_TABLET | Freq: Every day | ORAL | 11 refills | Status: DC

## 2021-04-06 NOTE — Progress Notes (Signed)
° °  Covid-19 Vaccination Clinic  Name:  Albie Arizpe    MRN: [id number] DOB: [birth year]  04/06/2021  Mr. Sizemore was observed post Covid-19 immunization for 15 minutes without incident. He was provided with Vaccine Information Sheet and instruction to access the V-Safe system.   Mr. Gentle was instructed to call 911 with any severe reactions post vaccine: Difficulty breathing  Swelling of face and throat  A fast heartbeat  A bad rash all over body  Dizziness and weakness   Immunizations Administered     Name Date Dose VIS Date Route   Pfizer Covid-19 Vaccine Bivalent Booster 04/06/2021  9:04 AM 0.3 mL 10/22/2020 Intramuscular   Manufacturer: [employer], [employer]   Lot: [id number]   NDC: [address]-6       Wyvonne Lenz, RN

## 2021-04-07 ENCOUNTER — Telehealth: Payer: Self-pay

## 2021-04-07 LAB — HEMOGLOBIN A1C
Hgb A1c MFr Bld: 6.4 % of total Hgb — ABNORMAL HIGH (ref <5.7)
Mean Plasma Glucose: 137 mg/dL
eAG (mmol/L): 7.6 mmol/L

## 2021-04-07 NOTE — Telephone Encounter (Signed)
-----   Message from Randall Hiss, MD sent at 04/07/2021  1:05 AM EST ----- Regarding: FW: Almost diabetic range definitely needs to see PCP and work on weight loss ----- Message ----- From: Janace Hoard Lab Results In Sent: 04/07/2021  12:54 AM EST To: Randall Hiss, MD

## 2021-04-07 NOTE — Telephone Encounter (Signed)
Called patient regarding lab results. Understands he will need to follow up with a PCP regarding A1C.  Patient verbalized understanding. Will follow up with Meredeth Ide, NP. Juanita Laster, RMA

## 2021-04-09 ENCOUNTER — Other Ambulatory Visit: Payer: Self-pay | Admitting: Pharmacist

## 2021-04-09 DIAGNOSIS — B20 Human immunodeficiency virus [HIV] disease: Secondary | ICD-10-CM

## 2021-04-09 MED ORDER — BIKTARVY 50-200-25 MG PO TABS: 1.0000 | ORAL_TABLET | Freq: Every day | ORAL | 0 refills | Status: DC

## 2021-07-14 ENCOUNTER — Ambulatory Visit: Payer: 59 | Admitting: Podiatry

## 2021-07-24 ENCOUNTER — Ambulatory Visit (INDEPENDENT_AMBULATORY_CARE_PROVIDER_SITE_OTHER): Payer: 59

## 2021-07-24 ENCOUNTER — Ambulatory Visit: Payer: 59 | Admitting: Podiatry

## 2021-07-24 DIAGNOSIS — M79671 Pain in right foot: Secondary | ICD-10-CM

## 2021-07-24 DIAGNOSIS — M2021 Hallux rigidus, right foot: Secondary | ICD-10-CM

## 2021-07-24 DIAGNOSIS — M2022 Hallux rigidus, left foot: Secondary | ICD-10-CM

## 2021-07-24 DIAGNOSIS — Z79899 Other long term (current) drug therapy: Secondary | ICD-10-CM

## 2021-07-24 DIAGNOSIS — M1000 Idiopathic gout, unspecified site: Secondary | ICD-10-CM | POA: Diagnosis not present

## 2021-07-24 DIAGNOSIS — B351 Tinea unguium: Secondary | ICD-10-CM | POA: Diagnosis not present

## 2021-07-24 NOTE — Patient Instructions (Signed)
Terbinafine Tablets What is this medication? TERBINAFINE (TER bin a feen) treats fungal infections of the nails. It belongs to a group of medications called antifungals. It will not treat infections caused by bacteria or viruses. This medicine may be used for other purposes; ask your health care provider or pharmacist if you have questions. COMMON BRAND NAME(S): Lamisil, Terbinex What should I tell my care team before I take this medication? They need to know if you have any of these conditions: Liver disease An unusual or allergic reaction to terbinafine, other medications, foods, dyes, or preservatives Pregnant or trying to get pregnant Breast-feeding How should I use this medication? Take this medication by mouth with water. Take it as directed on the prescription label at the same time every day. You can take it with or without food. If it upsets your stomach, take it with food. Keep taking it unless your care team tells you to stop. A special MedGuide will be given to you by the pharmacist with each prescription and refill. Be sure to read this information carefully each time. Talk to your care team regarding the use of this medication in children. Special care may be needed. Overdosage: If you think you have taken too much of this medicine contact a poison control center or emergency room at once. NOTE: This medicine is only for you. Do not share this medicine with others. What if I miss a dose? If you miss a dose, take it as soon as you can unless it is more than 4 hours late. If it is more than 4 hours late, skip the missed dose. Take the next dose at the normal time. What may interact with this medication? Do not take this medication with any of the following: Pimozide Thioridazine This medication may also interact with the following: Beta blockers Caffeine Certain medications for mental health conditions Cimetidine Cyclosporine Medications for fungal infections like fluconazole  and ketoconazole Medications for irregular heartbeat like amiodarone, flecainide and propafenone Rifampin Warfarin This list may not describe all possible interactions. Give your health care provider a list of all the medicines, herbs, non-prescription drugs, or dietary supplements you use. Also tell them if you smoke, drink alcohol, or use illegal drugs. Some items may interact with your medicine. What should I watch for while using this medication? Visit your care team for regular checks on your progress. You may need blood work while you are taking this medication. It may be some time before you see the benefit from this medication. This medication may cause serious skin reactions. They can happen weeks to months after starting the medication. Contact your care team right away if you notice fevers or flu-like symptoms with a rash. The rash may be red or purple and then turn into blisters or peeling of the skin. Or, you might notice a red rash with swelling of the face, lips or lymph nodes in your neck or under your arms. This medication can make you more sensitive to the sun. Keep out of the sun, If you cannot avoid being in the sun, wear protective clothing and sunscreen. Do not use sun lamps or tanning beds/booths. What side effects may I notice from receiving this medication? Side effects that you should report to your care team as soon as possible: Allergic reactions--skin rash, itching, hives, swelling of the face, lips, tongue, or throat Change in sense of smell Change in taste Infection--fever, chills, cough, or sore throat Liver injury--right upper belly pain, loss of appetite, nausea,   light-colored stool, dark yellow or brown urine, yellowing skin or eyes, unusual weakness or fatigue Low red blood cell level--unusual weakness or fatigue, dizziness, headache, trouble breathing Lupus-like syndrome--joint pain, swelling, or stiffness, butterfly-shaped rash on the face, rashes that get worse  in the sun, fever, unusual weakness or fatigue Rash, fever, and swollen lymph nodes Redness, blistering, peeling, or loosening of the skin, including inside the mouth Unusual bruising or bleeding Worsening mood, feelings of depression Side effects that usually do not require medical attention (report to your care team if they continue or are bothersome): Diarrhea Gas Headache Nausea Stomach pain Upset stomach This list may not describe all possible side effects. Call your doctor for medical advice about side effects. You may report side effects to FDA at 1-800-FDA-1088. Where should I keep my medication? Keep out of the reach of children and pets. Store between 20 and 25 degrees C (68 and 77 degrees F). Protect from light. Get rid of any unused medication after the expiration date. To get rid of medications that are no longer needed or have expired: Take the medication to a medication take-back program. Check with your pharmacy or law enforcement to find a location. If you cannot return the medication, check the label or package insert to see if the medication should be thrown out in the garbage or flushed down the toilet. If you are not sure, ask your care team. If it is safe to put it in the trash, take the medication out of the container. Mix the medication with cat litter, dirt, coffee grounds, or other unwanted substance. Seal the mixture in a bag or container. Put it in the trash. NOTE: This sheet is a summary. It may not cover all possible information. If you have questions about this medicine, talk to your doctor, pharmacist, or health care provider.  2023 Elsevier/Gold Standard (2020-09-24 00:00:00)  

## 2021-07-25 LAB — CBC WITH DIFFERENTIAL/PLATELET
Absolute Monocytes: 350 cells/uL (ref 200–950)
Basophils Absolute: 48 cells/uL (ref 0–200)
Basophils Relative: 0.9 %
Eosinophils Absolute: 143 cells/uL (ref 15–500)
Eosinophils Relative: 2.7 %
HCT: 41.9 % (ref 38.5–50.0)
Hemoglobin: 14 g/dL (ref 13.2–17.1)
Lymphs Abs: 2470 cells/uL (ref 850–3900)
MCH: 31.3 pg (ref 27.0–33.0)
MCHC: 33.4 g/dL (ref 32.0–36.0)
MCV: 93.5 fL (ref 80.0–100.0)
MPV: 9.7 fL (ref 7.5–12.5)
Monocytes Relative: 6.6 %
Neutro Abs: 2290 cells/uL (ref 1500–7800)
Neutrophils Relative %: 43.2 %
Platelets: 273 10*3/uL (ref 140–400)
RBC: 4.48 10*6/uL (ref 4.20–5.80)
RDW: 12.7 % (ref 11.0–15.0)
Total Lymphocyte: 46.6 %
WBC: 5.3 10*3/uL (ref 3.8–10.8)

## 2021-07-25 LAB — HEPATIC FUNCTION PANEL
AG Ratio: 1.5 (calc) (ref 1.0–2.5)
ALT: 22 U/L (ref 9–46)
AST: 26 U/L (ref 10–40)
Albumin: 4.2 g/dL (ref 3.6–5.1)
Alkaline phosphatase (APISO): 62 U/L (ref 36–130)
Bilirubin, Direct: 0.1 mg/dL (ref 0.0–0.2)
Globulin: 2.8 g/dL (calc) (ref 1.9–3.7)
Indirect Bilirubin: 0.4 mg/dL (calc) (ref 0.2–1.2)
Total Bilirubin: 0.5 mg/dL (ref 0.2–1.2)
Total Protein: 7 g/dL (ref 6.1–8.1)

## 2021-07-25 LAB — URIC ACID: Uric Acid, Serum: 9.3 mg/dL — ABNORMAL HIGH (ref 4.0–8.0)

## 2021-07-27 NOTE — Progress Notes (Signed)
Subjective:   Patient ID: Robert Stout, male   DOB: 46 y.o.   MRN: [id number]   HPI 46 year old male presents the office today for concerns of thick, elongated toenails that he is not able to trim himself, nail fungus.  Not had any recent treatment for this.  He also gets a throbbing, burning sensation mostly to his big toes with the right side worse than left.  Points on the IPJ where his discomfort.  This is not on a continual basis and he states he can go weeks without it and then suddenly will start hurting.  When it does hurt he does notice some swelling.  No redness.  No open lesions.  No trauma.  States he has flatfeet he has tried insoles.  No other concerns.   Review of Systems  All other systems reviewed and are negative.  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 (Patient not taking: Reported on 04/06/2021), Disp: 90 tablet, Rfl: 2   bictegravir-emtricitabine-tenofovir AF (BIKTARVY) 50-200-25 MG TABS tablet, Take 1 tablet by mouth daily., Disp: 30 tablet, Rfl: 11   bictegravir-emtricitabine-tenofovir AF (BIKTARVY) 50-200-25 MG TABS tablet, Take 1 tablet by mouth daily for 28 days., Disp: 28 tablet, Rfl: 0   imiquimod (ALDARA) 5 % cream, APPLY CONTENTS OF 1 PACKET TOPICALLY TO THE AFFECTED AREA THREE TIMES PER WEEK (Patient not taking: Reported on  09/01/2020), Disp: 12 each, Rfl: 3   loratadine (CLARITIN) 10 MG tablet, Take 1 tablet (10 mg total) by mouth daily. (Patient not taking: Reported on 04/06/2021), Disp: 30 tablet, Rfl: 11   mometasone-formoterol (DULERA) 100-5 MCG/ACT AERO, Inhale 2 puffs into the lungs 2 (two) times daily. NEEDS PASS, Disp: 13 g, Rfl: 11   valACYclovir (VALTREX) 500 MG tablet, Take 1 tablet (500 mg total) by mouth 2 (two) times daily. 1 TABLET DAILY (Patient not taking: Reported on 10/01/2019), Disp: 14 tablet, Rfl: 0  No Known Allergies       Objective:  Physical Exam  General: AAO x3, NAD  Dermatological: Nails appear to be significantly dystrophic, hypertrophic with yellow, brown discoloration.  There is no edema, erythema or signs of infection of the toenail sites.  There is no open lesions.  Vascular: Dorsalis Pedis artery and Posterior Tibial artery pedal pulses are 2/4 bilateral with immedate capillary fill time. There is no pain with calf compression, swelling, warmth, erythema.   Neruologic: Grossly intact via light touch bilateral.  Sensation intact with Semmes Weinstein monofilament.  Negative Tinel sign.  Musculoskeletal: Decreased medial arch height.  Tenderness on the hallux IPJ's bilaterally although mild today.  No edema, erythema.  No pain with imaging range of motion.  Decreased range of motion of first MPJ.  Muscular strength 5/5 in all groups tested bilateral.  Gait: Unassisted, Nonantalgic.  Assessment:   46 year old male with symptomatic onychomycosis, flatfoot, hallux mitis.     Plan:  -Treatment options discussed including all alternatives, risks, and complications -Etiology of symptoms were discussed -X-rays were obtained and reviewed with the patient.  3 views of bilateral feet were obtained.  No subacute fracture.  Arthritic changes present of the first MPJ. -Sharp debrided nails x10 without any complications or bleeding.  Discussed different treatment options for nail  fungus including oral, topical as well as alternative treatments.  Elects proceed with oral medication.  We will check a CBC and starting the medication. -We will check uric acid level to rule out gout  -Toe pain can likely be biomechanical.  Discussed shoes and different arch supports.  Can consider custom inserts.  Vivi Barrack DPM

## 2021-07-28 ENCOUNTER — Other Ambulatory Visit: Payer: Self-pay | Admitting: Podiatry

## 2021-07-28 ENCOUNTER — Other Ambulatory Visit: Payer: Self-pay

## 2021-07-28 DIAGNOSIS — Z79899 Other long term (current) drug therapy: Secondary | ICD-10-CM

## 2021-07-28 MED ORDER — COLCHICINE 0.6 MG PO TABS
0.6000 mg | ORAL_TABLET | Freq: Every day | ORAL | 0 refills | Status: DC
  Filled 2021-07-28: qty 10, 10d supply, fill #0

## 2021-07-28 MED ORDER — TERBINAFINE HCL 250 MG PO TABS
250.0000 mg | ORAL_TABLET | Freq: Every day | ORAL | 0 refills | Status: DC
Start: 2021-07-28 — End: 2022-10-11
  Filled 2021-07-28: qty 90, 90d supply, fill #0

## 2021-07-29 ENCOUNTER — Other Ambulatory Visit: Payer: Self-pay

## 2021-07-30 ENCOUNTER — Other Ambulatory Visit (HOSPITAL_COMMUNITY): Payer: Self-pay

## 2021-07-30 ENCOUNTER — Other Ambulatory Visit: Payer: Self-pay

## 2021-08-04 ENCOUNTER — Other Ambulatory Visit: Payer: Self-pay

## 2021-08-20 ENCOUNTER — Other Ambulatory Visit: Payer: Self-pay | Admitting: Podiatry

## 2021-08-20 DIAGNOSIS — M2021 Hallux rigidus, right foot: Secondary | ICD-10-CM

## 2021-09-02 ENCOUNTER — Ambulatory Visit: Payer: 59 | Admitting: Physician Assistant

## 2021-09-04 ENCOUNTER — Other Ambulatory Visit: Payer: 59

## 2021-09-08 ENCOUNTER — Emergency Department (HOSPITAL_COMMUNITY)
Admission: EM | Admit: 2021-09-08 | Discharge: 2021-09-08 | Disposition: A | Discharge status: Home / Self Care | Payer: 59 | Attending: Emergency Medicine | Admitting: Emergency Medicine

## 2021-09-08 ENCOUNTER — Encounter (HOSPITAL_COMMUNITY): Payer: Self-pay | Admitting: Emergency Medicine

## 2021-09-08 ENCOUNTER — Ambulatory Visit: Payer: Self-pay | Admitting: *Deleted

## 2021-09-08 ENCOUNTER — Other Ambulatory Visit: Payer: Self-pay

## 2021-09-08 DIAGNOSIS — R369 Urethral discharge, unspecified: Secondary | ICD-10-CM | POA: Insufficient documentation

## 2021-09-08 DIAGNOSIS — Z21 Asymptomatic human immunodeficiency virus [HIV] infection status: Secondary | ICD-10-CM | POA: Diagnosis not present

## 2021-09-08 DIAGNOSIS — Z5321 Procedure and treatment not carried out due to patient leaving prior to being seen by health care provider: Secondary | ICD-10-CM | POA: Insufficient documentation

## 2021-09-08 LAB — URINALYSIS, ROUTINE W REFLEX MICROSCOPIC
Bilirubin Urine: NEGATIVE
Glucose, UA: NEGATIVE mg/dL
Hgb urine dipstick: NEGATIVE
Ketones, ur: NEGATIVE mg/dL
Nitrite: NEGATIVE
Protein, ur: NEGATIVE mg/dL
Specific Gravity, Urine: 1.02 (ref 1.005–1.030)
WBC, UA: >50 WBC/hpf — ABNORMAL HIGH (ref 0–5)
pH: 6 (ref 5.0–8.0)

## 2021-09-08 NOTE — ED Notes (Signed)
Order requisition sent for add on labs

## 2021-09-08 NOTE — ED Notes (Signed)
Patient called for vitals x1 

## 2021-09-08 NOTE — ED Provider Triage Note (Signed)
Emergency Medicine Provider Triage Evaluation Note  Robert Stout , a 46 y.o. male  was evaluated in triage.  Pt complains of clear penile discharge x5 days.  No testicular pain or urinary symptoms, history of HIV compliant with medications.   Review of Systems  Positive: Penile discharge Negative: Testicular pain  Physical Exam  BP 120/78 (BP Location: Right Arm)   Pulse 80   Temp 99.1 F (37.3 C) (Oral)   Resp 18   SpO2 97%  Gen:   Awake, no distress   Resp:  Normal effort  MSK:   Moves extremities without difficulty  Other:  No abdominal tenderness  Medical Decision Making  Medically screening exam initiated at 8:53 AM.  Appropriate orders placed.  Robert Stout was informed that the remainder of the evaluation will be completed by another provider, this initial triage assessment does not replace that evaluation, and the importance of remaining in the ED until their evaluation is complete.  Work-up initiated   Robert Captain, PA-C 09/08/21 3419

## 2021-09-08 NOTE — ED Triage Notes (Signed)
C/o clear penile discharge since Friday.  Denies urinary symptoms.

## 2021-09-08 NOTE — Telephone Encounter (Signed)
Per [employer]: "Pt is at the ED experiencing penile discharge, he wants to speak to a nurse because he believes he needs to be seen sooner. Been happening since Friday."  Best contact: [phone]    Chief Complaint: Penile Discharge Symptoms: Clear discharge 3/10 pain. Pt evasive historian. Calling from ED, states just leaving. States took urine sample, told him it would be 3-5 hours for treatment and 3 days before med given. Pt left ED "Without being treated." Did note UA results available in Epic. Pt states "I need this other stuff taken care of sooner." Clarified pt was referring to discharge and penile pain. Frequency: Friday Pertinent Negatives: Patient denies fever, swelling,back, flank pain,dysuria,No urinary symptoms.   Disposition: [] ED /[] Urgent Care (no appt availability in office) / [] Appointment(In office/virtual)/ []  [facility]/ [] Home Care/ [x] Refused Recommended Disposition /[] [facility]/ []  Follow-up with PCP Additional Notes: No availability. Advised UC, [facility], declines. [facility] not available. Assured pt NT would route to practice for PCPs review. Advised return to ED for worsening symptoms. Reason for Disposition  All other penis - scrotum symptoms  (Exception: Painless rash < 24 hours duration.)    Clear discharge  Answer Assessment - Initial Assessment Questions 1. SYMPTOM: "What's the main symptom you're concerned about?" (e.g., discharge from penis, rash, pain, itching, swelling)     Discharge from penis 2. LOCATION: "Where is the  located?"     Clear 3. ONSET: "When did   start?"     friday 4. PAIN: "Is there any pain?" If Yes, ask: "How bad is it?"  (Scale 1-10; or mild, moderate, severe)     Penis, 7/10 5. URINE: "Any difficulty passing urine?" If Yes, ask: "When was the last time?"     No 6. CAUSE: "What do you think is causing the symptoms?"     Unsure 7. OTHER SYMPTOMS: "Do you have any other symptoms?" (e.g., fever,  abdomen pain, blood in urine)     No  Protocols used: Penis and Scrotum Symptoms-A-AH

## 2021-09-09 ENCOUNTER — Other Ambulatory Visit: Payer: Self-pay

## 2021-09-09 ENCOUNTER — Encounter (HOSPITAL_COMMUNITY): Payer: Self-pay | Admitting: Emergency Medicine

## 2021-09-09 ENCOUNTER — Emergency Department (HOSPITAL_COMMUNITY)
Admission: EM | Admit: 2021-09-09 | Discharge: 2021-09-09 | Disposition: A | Discharge status: Home / Self Care | Payer: 59 | Attending: Emergency Medicine | Admitting: Emergency Medicine

## 2021-09-09 DIAGNOSIS — Z21 Asymptomatic human immunodeficiency virus [HIV] infection status: Secondary | ICD-10-CM | POA: Diagnosis not present

## 2021-09-09 DIAGNOSIS — A749 Chlamydial infection, unspecified: Secondary | ICD-10-CM | POA: Diagnosis not present

## 2021-09-09 DIAGNOSIS — A549 Gonococcal infection, unspecified: Secondary | ICD-10-CM | POA: Insufficient documentation

## 2021-09-09 DIAGNOSIS — R369 Urethral discharge, unspecified: Secondary | ICD-10-CM | POA: Diagnosis present

## 2021-09-09 LAB — GC/CHLAMYDIA PROBE AMP (~~LOC~~) NOT AT ARMC
Chlamydia: POSITIVE — AB
Comment: NEGATIVE
Comment: NORMAL
Neisseria Gonorrhea: POSITIVE — AB

## 2021-09-09 MED ORDER — LIDOCAINE HCL (PF) 1 % IJ SOLN
INTRAMUSCULAR | Status: AC
  Administered 2021-09-09: 1 mL
  Filled 2021-09-09: qty 5

## 2021-09-09 MED ORDER — AZITHROMYCIN 1 G PO PACK
1.0000 g | PACK | Freq: Once | ORAL | Status: AC
  Administered 2021-09-09: 1 g via ORAL
  Filled 2021-09-09: qty 1

## 2021-09-09 MED ORDER — DOXYCYCLINE HYCLATE 100 MG PO TABS
100.0000 mg | ORAL_TABLET | Freq: Two times a day (BID) | ORAL | 0 refills | Status: DC
  Filled 2021-09-09: qty 20, 10d supply, fill #0

## 2021-09-09 MED ORDER — CEFTRIAXONE SODIUM 500 MG IJ SOLR
500.0000 mg | Freq: Once | INTRAMUSCULAR | Status: AC
  Administered 2021-09-09: 500 mg via INTRAMUSCULAR
  Filled 2021-09-09: qty 500

## 2021-09-09 NOTE — ED Provider Notes (Signed)
[facility] EMERGENCY DEPARTMENT Provider Note   CSN: [id number] Arrival date & time: 09/09/21  1037     History  Chief Complaint  Patient presents with   Penile Discharge    Robert Stout is a 46 y.o. male.  46 year old male with prior medical history as detailed below presents for evaluation.  Patient reports 1 week of penile discharge.  Patient presented to the ED yesterday for same complaint.  Patient's GC chlamydia from yesterday was positive for both gonorrhea and chlamydia.  Patient with history of HIV.  He is followed by ID for same.  He denies other new complaint.  Today symptoms are consistent with urethritis.   The history is provided by the patient and medical records.  Penile Discharge This is a new problem. The current episode started more than 2 days ago. The problem occurs daily. The problem has not changed since onset.Pertinent negatives include no chest pain. Nothing aggravates the symptoms. Nothing relieves the symptoms.       Home Medications Prior to Admission medications   Medication Sig Start Date End Date Taking? Authorizing Provider  albuterol (VENTOLIN HFA) 108 (90 Base) MCG/ACT inhaler Inhale 2 puffs into the lungs every 6 (six) hours as needed for wheezing or shortness of breath. 09/01/20   Claiborne Rigg, NP  allopurinol (ZYLOPRIM) 100 MG tablet Take 1 tablet (100 mg total) by mouth daily. Patient not taking: Reported on 04/06/2021 09/01/20   Claiborne Rigg, NP  bictegravir-emtricitabine-tenofovir AF (BIKTARVY) 50-200-25 MG TABS tablet Take 1 tablet by mouth daily. 04/06/21   Randall Hiss, MD  bictegravir-emtricitabine-tenofovir AF (BIKTARVY) 50-200-25 MG TABS tablet Take 1 tablet by mouth daily for 28 days. 04/06/21 05/04/21  Kuppelweiser, Cassie L, RPH-CPP  colchicine 0.6 MG tablet Take 1 tablet (0.6 mg total) by mouth daily. 07/28/21   Vivi Barrack, DPM  imiquimod (ALDARA) 5 % cream APPLY CONTENTS OF 1 PACKET  TOPICALLY TO THE AFFECTED AREA THREE TIMES PER WEEK Patient not taking: Reported on 09/01/2020 02/08/20   Daiva Eves, Lisette Grinder, MD  loratadine (CLARITIN) 10 MG tablet Take 1 tablet (10 mg total) by mouth daily. Patient not taking: Reported on 04/06/2021 09/01/20   Claiborne Rigg, NP  mometasone-formoterol (DULERA) 100-5 MCG/ACT AERO Inhale 2 puffs into the lungs 2 (two) times daily. NEEDS PASS 09/01/20   Claiborne Rigg, NP  terbinafine (LAMISIL) 250 MG tablet Take 1 tablet (250 mg total) by mouth daily. 07/28/21   Vivi Barrack, DPM  valACYclovir (VALTREX) 500 MG tablet Take 1 tablet (500 mg total) by mouth 2 (two) times daily. 1 TABLET DAILY Patient not taking: Reported on 10/01/2019 08/07/18   Daiva Eves, Lisette Grinder, MD      Allergies    Patient has no known allergies.    Review of Systems   Review of Systems  Cardiovascular:  Negative for chest pain.  Genitourinary:  Positive for penile discharge.  All other systems reviewed and are negative.   Physical Exam Updated Vital Signs BP 131/71   Pulse 72   Temp 98 F (36.7 C)   Resp 18   SpO2 100%  Physical Exam Vitals and nursing note reviewed.  Constitutional:      General: He is not in acute distress.    Appearance: Normal appearance. He is well-developed.  HENT:     Head: Normocephalic and atraumatic.  Eyes:     Conjunctiva/sclera: Conjunctivae normal.     Pupils: Pupils are equal, round,  and reactive to light.  Cardiovascular:     Rate and Rhythm: Normal rate.  Pulmonary:     Effort: Pulmonary effort is normal. No respiratory distress.  Abdominal:     General: There is no distension.     Palpations: Abdomen is soft.     Tenderness: There is no abdominal tenderness.  Musculoskeletal:        General: No deformity. Normal range of motion.     Cervical back: Normal range of motion and neck supple.  Skin:    General: Skin is warm and dry.  Neurological:     General: No focal deficit present.     Mental Status: He is  alert and oriented to person, place, and time.     ED Results / Procedures / Treatments   Labs (all labs ordered are listed, but only abnormal results are displayed) Labs Reviewed - No data to display  EKG None  Radiology No results found.  Procedures Procedures    Medications Ordered in ED Medications  azithromycin (ZITHROMAX) powder 1 g (has no administration in time range)  cefTRIAXone (ROCEPHIN) injection 500 mg (has no administration in time range)    ED Course/ Medical Decision Making/ A&P                           Medical Decision Making Risk Prescription drug management.    Medical Screen Complete  This patient presented to the ED with complaint of penile discharge.  This complaint involves an extensive number of treatment options. The initial differential diagnosis includes, but is not limited to, STI  This presentation is: Acute, Self-Limited, Previously Undiagnosed, and Uncertain Prognosis  Patient with 1 week of penile discharge.  Patient with testing performed yesterday at this facility that was positive for both gonorrhea and chlamydia.  Patient desires treatment.  Patient does have established outpatient care.  He does understand need for close follow-up.  He is advised to obtain additional screening for other STI.  Additional history obtained:  External records from outside sources obtained and reviewed including prior ED visits and prior Inpatient records.  Medicines ordered:  I ordered medication including Rocephin, azithromycin for STI Reevaluation of the patient after these medicines showed that the patient: improved   Problem List / ED Course:  Gonorrhea, chlamydia   Reevaluation:  After the interventions noted above, I reevaluated the patient and found that they have: stayed the same   Disposition:  After consideration of the diagnostic results and the patients response to treatment, I feel that the patent would benefit from  close outpatient follow-up.          Final Clinical Impression(s) / ED Diagnoses Final diagnoses:  Gonorrhea  Chlamydia    Rx / DC Orders ED Discharge Orders          Ordered    doxycycline (VIBRAMYCIN) 100 MG capsule  2 times daily        09/09/21 1748              Wynetta Fines, MD 09/09/21 [phone]

## 2021-09-09 NOTE — Telephone Encounter (Signed)
Called pt and offered the mobile clinic and I will be sending via mychart the address for tomorrow

## 2021-09-09 NOTE — ED Provider Triage Note (Signed)
Emergency Medicine Provider Triage Evaluation Note  Robert Stout , a 46 y.o. male  was evaluated in triage.  Pt complains of penile discharge x1 week.  Was seen yesterday, left before being seen.  UA was notable for leukocytes, GC chlamydia still pending..  Review of Systems  Per HPI  Physical Exam  BP 119/86 (BP Location: Right Arm)   Pulse 75   Temp 97.8 F (36.6 C) (Oral)   Resp 15   SpO2 98%  Gen:   Awake, no distress   Resp:  Normal effort  MSK:   Moves extremities without difficulty  Other:    Medical Decision Making  Medically screening exam initiated at 11:30 AM.  Appropriate orders placed.  Robert Stout was informed that the remainder of the evaluation will be completed by another provider, this initial triage assessment does not replace that evaluation, and the importance of remaining in the ED until their evaluation is complete.     Theron Arista, PA-C 09/09/21 1131

## 2021-09-09 NOTE — Discharge Instructions (Addendum)
Return for any problem.  Your sexual partner needs to be informed of your positive test else.  You should abstain from intercourse or other sexual activity for the next 2 weeks.

## 2021-09-09 NOTE — ED Triage Notes (Signed)
Patient here with compliant of penile discharge that started last week. Patient came yesterday for same but left without being treated. Patient is alert, oriented, ambulatory, and in no apparent distress at this time.

## 2021-09-10 ENCOUNTER — Other Ambulatory Visit: Payer: Self-pay

## 2021-09-18 ENCOUNTER — Encounter: Payer: 59 | Admitting: Infectious Disease

## 2021-09-23 ENCOUNTER — Other Ambulatory Visit: Payer: Self-pay

## 2021-09-23 DIAGNOSIS — B20 Human immunodeficiency virus [HIV] disease: Secondary | ICD-10-CM

## 2021-09-23 DIAGNOSIS — Z113 Encounter for screening for infections with a predominantly sexual mode of transmission: Secondary | ICD-10-CM

## 2021-09-25 ENCOUNTER — Other Ambulatory Visit: Payer: 59

## 2021-09-28 ENCOUNTER — Other Ambulatory Visit: Payer: Self-pay

## 2021-09-28 ENCOUNTER — Other Ambulatory Visit: Payer: 59

## 2021-09-28 DIAGNOSIS — Z113 Encounter for screening for infections with a predominantly sexual mode of transmission: Secondary | ICD-10-CM

## 2021-09-28 DIAGNOSIS — B20 Human immunodeficiency virus [HIV] disease: Secondary | ICD-10-CM

## 2021-09-29 LAB — T-HELPER CELL (CD4) - (RCID CLINIC ONLY)
CD4 % Helper T Cell: 35 % (ref 33–65)
CD4 T Cell Abs: 873 /uL (ref 400–1790)

## 2021-09-30 LAB — CBC WITH DIFFERENTIAL/PLATELET
Absolute Monocytes: 437 cells/uL (ref 200–950)
Basophils Absolute: 50 cells/uL (ref 0–200)
Basophils Relative: 0.9 %
Eosinophils Absolute: 118 cells/uL (ref 15–500)
Eosinophils Relative: 2.1 %
HCT: 41.8 % (ref 38.5–50.0)
Hemoglobin: 14.2 g/dL (ref 13.2–17.1)
Lymphs Abs: 2761 cells/uL (ref 850–3900)
MCH: 31.4 pg (ref 27.0–33.0)
MCHC: 34 g/dL (ref 32.0–36.0)
MCV: 92.5 fL (ref 80.0–100.0)
MPV: 9.7 fL (ref 7.5–12.5)
Monocytes Relative: 7.8 %
Neutro Abs: 2234 cells/uL (ref 1500–7800)
Neutrophils Relative %: 39.9 %
Platelets: 297 10*3/uL (ref 140–400)
RBC: 4.52 10*6/uL (ref 4.20–5.80)
RDW: 13 % (ref 11.0–15.0)
Total Lymphocyte: 49.3 %
WBC: 5.6 10*3/uL (ref 3.8–10.8)

## 2021-09-30 LAB — COMPLETE METABOLIC PANEL WITH GFR
AG Ratio: 1.4 (calc) (ref 1.0–2.5)
ALT: 21 U/L (ref 9–46)
AST: 20 U/L (ref 10–40)
Albumin: 4.1 g/dL (ref 3.6–5.1)
Alkaline phosphatase (APISO): 64 U/L (ref 36–130)
BUN/Creatinine Ratio: 7 (calc) (ref 6–22)
BUN: 11 mg/dL (ref 7–25)
CO2: 29 mmol/L (ref 20–32)
Calcium: 9.1 mg/dL (ref 8.6–10.3)
Chloride: 104 mmol/L (ref 98–110)
Creat: 1.5 mg/dL — ABNORMAL HIGH (ref 0.60–1.29)
Globulin: 3 g/dL (calc) (ref 1.9–3.7)
Glucose, Bld: 117 mg/dL — ABNORMAL HIGH (ref 65–99)
Potassium: 3.9 mmol/L (ref 3.5–5.3)
Sodium: 139 mmol/L (ref 135–146)
Total Bilirubin: 0.2 mg/dL (ref 0.2–1.2)
Total Protein: 7.1 g/dL (ref 6.1–8.1)
eGFR: 57 mL/min/{1.73_m2} — ABNORMAL LOW (ref >=60)

## 2021-09-30 LAB — HIV-1 RNA QUANT-NO REFLEX-BLD
HIV 1 RNA Quant: NOT DETECTED Copies/mL
HIV-1 RNA Quant, Log: NOT DETECTED Log cps/mL

## 2021-09-30 LAB — RPR: RPR Ser Ql: NONREACTIVE

## 2021-10-07 ENCOUNTER — Ambulatory Visit: Payer: 59

## 2021-10-07 ENCOUNTER — Other Ambulatory Visit (HOSPITAL_COMMUNITY)
Admission: RE | Admit: 2021-10-07 | Discharge: 2021-10-07 | Disposition: A | Discharge status: Home / Self Care | Payer: 59 | Source: Ambulatory Visit | Attending: Infectious Disease | Admitting: Infectious Disease

## 2021-10-07 ENCOUNTER — Other Ambulatory Visit (HOSPITAL_COMMUNITY): Payer: Self-pay

## 2021-10-07 ENCOUNTER — Other Ambulatory Visit: Payer: Self-pay | Admitting: Infectious Disease

## 2021-10-07 ENCOUNTER — Telehealth: Payer: Self-pay

## 2021-10-07 ENCOUNTER — Other Ambulatory Visit: Payer: Self-pay

## 2021-10-07 ENCOUNTER — Encounter: Payer: Self-pay | Admitting: Infectious Disease

## 2021-10-07 ENCOUNTER — Ambulatory Visit (INDEPENDENT_AMBULATORY_CARE_PROVIDER_SITE_OTHER): Payer: 59 | Admitting: Infectious Disease

## 2021-10-07 VITALS — BP 122/84 | HR 65 | Resp 16 | Ht 70.0 in | Wt 283.4 lb

## 2021-10-07 DIAGNOSIS — B009 Herpesviral infection, unspecified: Secondary | ICD-10-CM | POA: Diagnosis not present

## 2021-10-07 DIAGNOSIS — A549 Gonococcal infection, unspecified: Secondary | ICD-10-CM

## 2021-10-07 DIAGNOSIS — B20 Human immunodeficiency virus [HIV] disease: Secondary | ICD-10-CM | POA: Diagnosis not present

## 2021-10-07 DIAGNOSIS — A749 Chlamydial infection, unspecified: Secondary | ICD-10-CM

## 2021-10-07 HISTORY — DX: Chlamydial infection, unspecified: A74.9

## 2021-10-07 HISTORY — DX: Gonococcal infection, unspecified: A54.9

## 2021-10-07 MED ORDER — BIKTARVY 50-200-25 MG PO TABS: 1.0000 | ORAL_TABLET | Freq: Every day | ORAL | 11 refills | Status: DC

## 2021-10-07 MED ORDER — PITAVASTATIN MAGNESIUM 4 MG PO TABS: 4.0000 mg | ORAL_TABLET | Freq: Every day | ORAL | 11 refills | Status: DC

## 2021-10-07 NOTE — Telephone Encounter (Signed)
PA for pitavastatin faxed to [facility]. Awaiting response.   [facility] P: [phone] F: [phone]  Sandie Ano, RN

## 2021-10-07 NOTE — Progress Notes (Signed)
Subjective:   Chief complaint: Follow-up for HIV disease on medications:  Patient ID: Robert Stout, male    DOB: Mar 07, 1975, 46 y.o.   MRN: [id number]     Robert Stout is a 46 year-old African-American man living with HIV that is typically been well controlled most recently on Biktarvy.  He says he is highly adherent to his ARV.      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   HIV infection ([facility]) Dx 2011   Hyperglycemia 09/01/2020   Rectal bleeding 10/09/2018   Vaccine counseling 04/05/2021   Weight gain 10/09/2018    Past Surgical History:  Procedure Laterality Date   TENDON REPAIR      Family History  Problem Relation Age of Onset   Diabetes Mother    Hypertension Mother       Social History   Socioeconomic History   Marital status: Single    Spouse name: Not on file   Number of children: Not on file   Years of education: Not on file   Highest education level: Not on file  Occupational History   Not on file  Tobacco Use   Smoking status: Never   Smokeless tobacco: Never  Substance and Sexual Activity   Alcohol use: No    Alcohol/week: 0.0 standard drinks of alcohol   Drug use: No   Sexual activity: Not Currently  Other Topics Concern   Not on file  Social History Narrative   Not on file   Social Determinants of Health   Financial Resource Strain: Not on file  Food Insecurity: Not on file  Transportation Needs: Not on file  Physical Activity: Not on file  Stress: Not on file  Social Connections: Not on file    No Known Allergies   Current Outpatient Medications:    albuterol (VENTOLIN HFA) 108 (90 Base) MCG/ACT inhaler, Inhale 2 puffs into the lungs every 6 (six) hours as needed for wheezing or shortness of breath., Disp: 18 g, Rfl: 1   allopurinol (ZYLOPRIM) 100 MG tablet, Take 1 tablet (100 mg total) by mouth daily.  (Patient not taking: Reported on 04/06/2021), Disp: 90 tablet, Rfl: 2   bictegravir-emtricitabine-tenofovir AF (BIKTARVY) 50-200-25 MG TABS tablet, Take 1 tablet by mouth daily., Disp: 30 tablet, Rfl: 11   bictegravir-emtricitabine-tenofovir AF (BIKTARVY) 50-200-25 MG TABS tablet, Take 1 tablet by mouth daily for 28 days., Disp: 28 tablet, Rfl: 0   colchicine 0.6 MG tablet, Take 1 tablet (0.6 mg total) by mouth daily., Disp: 10 tablet, Rfl: 0   doxycycline (VIBRA-TABS) 100 MG tablet, Take 1 tablet (100 mg total) by mouth 2 (two) times daily., Disp: 20 tablet, Rfl: 0   imiquimod (ALDARA) 5 % cream, APPLY CONTENTS OF 1 PACKET TOPICALLY TO THE AFFECTED AREA THREE TIMES PER WEEK (Patient not taking: Reported on 09/01/2020), Disp: 12 each, Rfl: 3   loratadine (CLARITIN) 10 MG tablet, Take 1 tablet (10 mg total) by mouth daily. (Patient not taking: Reported on 04/06/2021), Disp: 30 tablet, Rfl: 11   mometasone-formoterol (DULERA) 100-5 MCG/ACT AERO, Inhale 2 puffs into the lungs 2 (two) times daily. NEEDS PASS, Disp: 13 g, Rfl: 11   terbinafine (LAMISIL) 250 MG tablet, Take 1 tablet (250 mg total) by mouth daily., Disp: 90 tablet, Rfl: 0   valACYclovir (VALTREX) 500 MG tablet, Take 1 tablet (500 mg total)  by mouth 2 (two) times daily. 1 TABLET DAILY (Patient not taking: Reported on 10/01/2019), Disp: 14 tablet, Rfl: 0    Review of Systems  Constitutional:  Negative for activity change, appetite change, chills, diaphoresis, fatigue, fever and unexpected weight change.  HENT:  Negative for congestion, rhinorrhea, sinus pressure, sneezing, sore throat and trouble swallowing.   Eyes:  Negative for photophobia and visual disturbance.  Respiratory:  Negative for cough, chest tightness, shortness of breath, wheezing and stridor.   Cardiovascular:  Negative for chest pain, palpitations and leg swelling.  Gastrointestinal:  Negative for abdominal distention, abdominal pain, anal bleeding, blood in stool,  constipation, diarrhea, nausea and vomiting.  Genitourinary:  Negative for difficulty urinating, dysuria, flank pain and hematuria.  Musculoskeletal:  Negative for arthralgias, back pain, gait problem, joint swelling and myalgias.  Skin:  Negative for color change, pallor, rash and wound.  Neurological:  Negative for dizziness, tremors, weakness and light-headedness.  Hematological:  Negative for adenopathy. Does not bruise/bleed easily.  Psychiatric/Behavioral:  Negative for agitation, behavioral problems, confusion, decreased concentration, dysphoric mood and sleep disturbance.       Objective:   Physical Exam Constitutional:      Appearance: He is well-developed.  HENT:     Head: Normocephalic and atraumatic.  Eyes:     Conjunctiva/sclera: Conjunctivae normal.  Cardiovascular:     Rate and Rhythm: Normal rate and regular rhythm.  Pulmonary:     Effort: Pulmonary effort is normal. No respiratory distress.     Breath sounds: No wheezing.  Abdominal:     General: There is no distension.     Palpations: Abdomen is soft.  Musculoskeletal:        General: No tenderness. Normal range of motion.     Cervical back: Normal range of motion and neck supple.  Skin:    General: Skin is warm and dry.     Coloration: Skin is not pale.     Findings: No erythema or rash.  Neurological:     General: No focal deficit present.     Mental Status: He is alert and oriented to person, place, and time.  Psychiatric:        Mood and Affect: Mood normal.        Behavior: Behavior normal.        Thought Content: Thought content normal.        Judgment: Judgment normal.          Assessment & Plan:   HIV disease:  I reviewed his viral load from September 28, 2021 which was not detected and CD4 count that was 873.  Lab Results  Component Value Date   HIV1RNAQUANT Not Detected 09/28/2021   Lab Results  Component Value Date   CD4TABS 873 09/28/2021   CD4TABS 997 03/18/2021   CD4TABS 876  08/18/2020    I am continue his Biktarvy  Asthma:  He should and continue inhaled Dulera and albuterol MDI  CV prevention: will start pitavastatin unless he has [employer] plan that insists on different statin  Hx of GC and chlamydia: will rescreen urine

## 2021-10-07 NOTE — Addendum Note (Signed)
Addended by: Randall Hiss on: 10/07/2021 09:21 AM   Modules accepted: Orders

## 2021-10-08 LAB — URINE CYTOLOGY ANCILLARY ONLY
Chlamydia: NEGATIVE
Comment: NEGATIVE
Comment: NORMAL
Neisseria Gonorrhea: NEGATIVE

## 2021-10-15 NOTE — Telephone Encounter (Signed)
Requested call back from [facility] to request update on PA.   Sandie Ano, RN

## 2021-10-29 NOTE — Telephone Encounter (Signed)
Requested call back from [facility] for update on PA.   Sandie Ano, RN

## 2021-12-03 ENCOUNTER — Other Ambulatory Visit: Payer: Self-pay | Admitting: Infectious Disease

## 2021-12-03 MED ORDER — ATORVASTATIN CALCIUM 20 MG PO TABS: 20.0000 mg | ORAL_TABLET | Freq: Every day | ORAL | 11 refills | Status: DC

## 2021-12-03 NOTE — Progress Notes (Signed)
ato 

## 2021-12-03 NOTE — Telephone Encounter (Signed)
Third attempt to contact insurance regarding outcome of PA, unable to speak with a representative.   Beryle Flock, RN

## 2021-12-08 IMAGING — DX DG CHEST 1V PORT
1 series · 1 of 1 positions shown · non-contrast
Comparison: 02/06/2018

CLINICAL DATA: Cough

EXAM:
PORTABLE CHEST 1 VIEW

[chest ap]
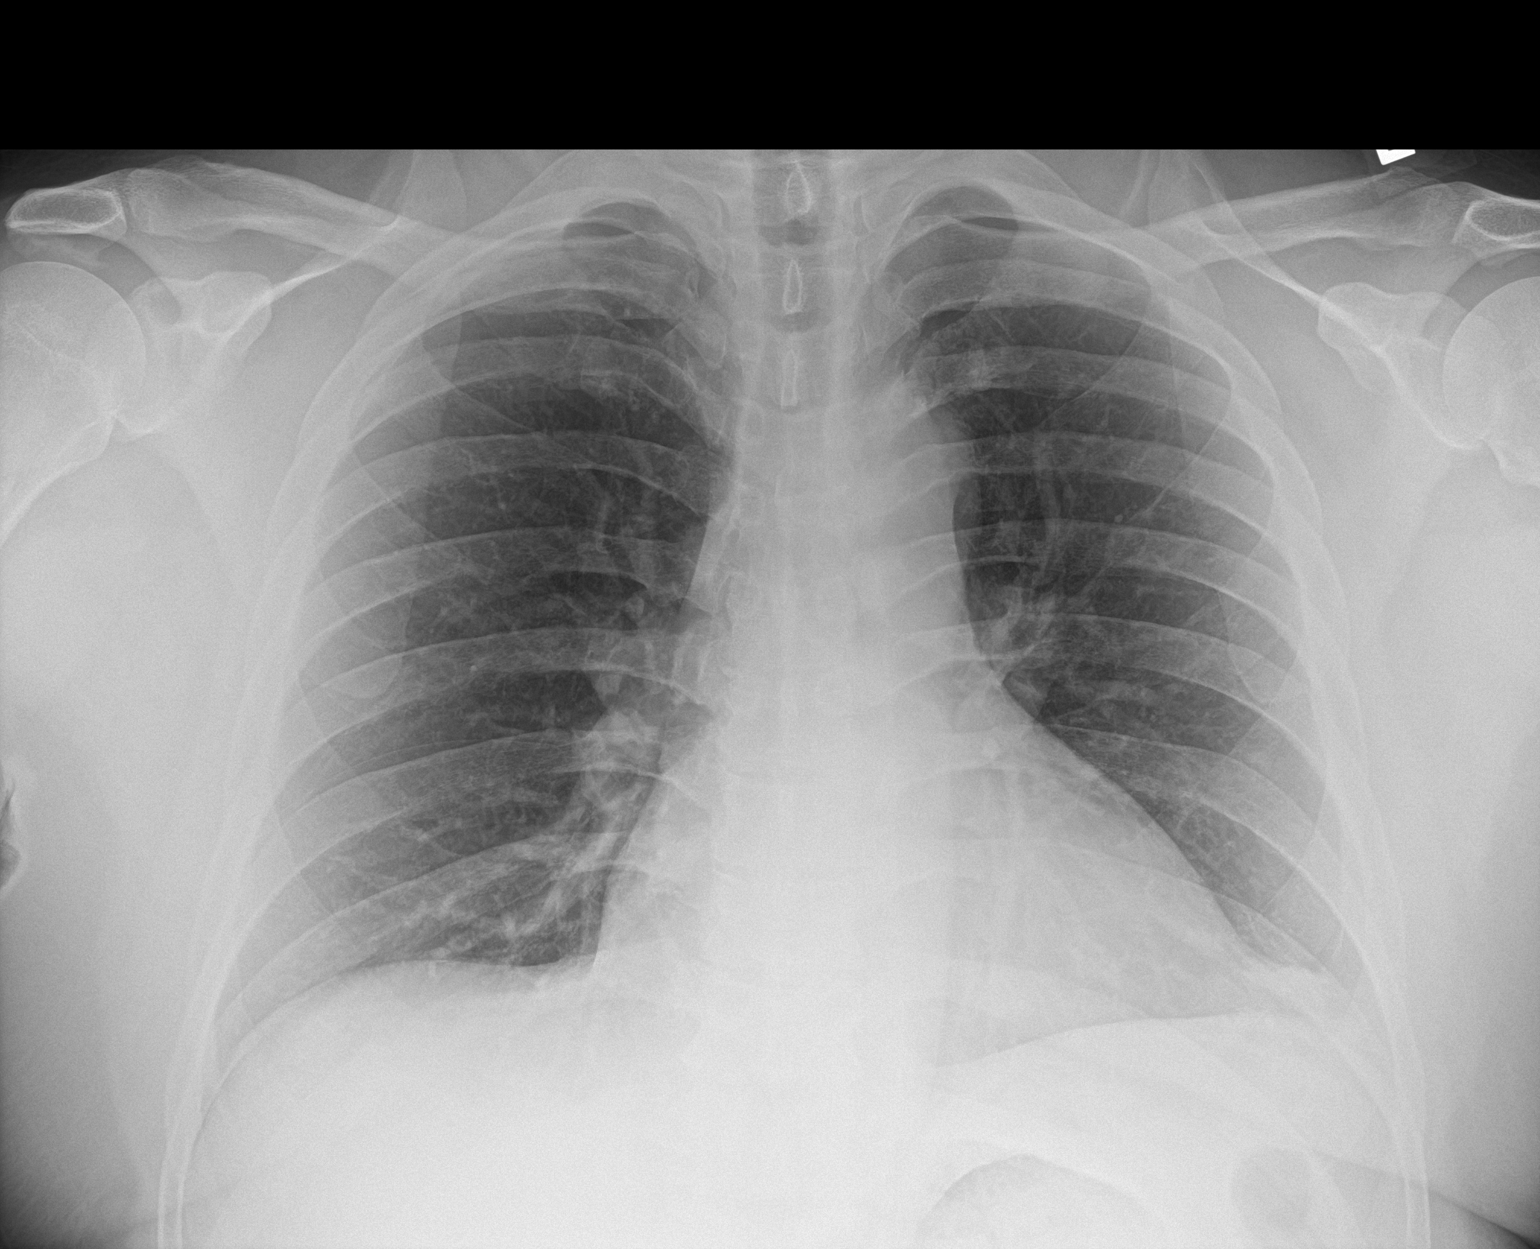

[1 of 1 positions shown; findings below may reference images not displayed]

FINDINGS: The heart size and mediastinal contours are within normal limits.
Both lungs are clear. The visualized skeletal structures are
unremarkable.
IMPRESSION: No active disease.

## 2021-12-30 ENCOUNTER — Other Ambulatory Visit: Payer: Self-pay

## 2021-12-30 ENCOUNTER — Telehealth: Payer: Self-pay

## 2021-12-30 MED ORDER — ATORVASTATIN CALCIUM 20 MG PO TABS: 20.0000 mg | ORAL_TABLET | Freq: Every day | ORAL | 1 refills | Status: DC

## 2021-12-30 NOTE — Telephone Encounter (Signed)
Called patient after receiving refill request for Atorvastatin 90 supply from [facility]. Prescription sent to [facility] in October. Will need to confirm with patient which pharmacy he would prefer to use. Request he call office back.  Juanita Laster, RMA

## 2022-01-11 ENCOUNTER — Other Ambulatory Visit: Payer: Self-pay

## 2022-01-11 ENCOUNTER — Emergency Department (HOSPITAL_BASED_OUTPATIENT_CLINIC_OR_DEPARTMENT_OTHER): Payer: Commercial Managed Care - HMO | Admitting: Radiology

## 2022-01-11 ENCOUNTER — Encounter (HOSPITAL_BASED_OUTPATIENT_CLINIC_OR_DEPARTMENT_OTHER): Payer: Self-pay

## 2022-01-11 ENCOUNTER — Emergency Department (HOSPITAL_BASED_OUTPATIENT_CLINIC_OR_DEPARTMENT_OTHER)
Admission: EM | Admit: 2022-01-11 | Discharge: 2022-01-11 | Disposition: A | Discharge status: Home / Self Care | Payer: Commercial Managed Care - HMO | Attending: Emergency Medicine | Admitting: Emergency Medicine

## 2022-01-11 DIAGNOSIS — M79671 Pain in right foot: Secondary | ICD-10-CM | POA: Insufficient documentation

## 2022-01-11 MED ORDER — INDOMETHACIN 50 MG PO CAPS: 50.0000 mg | ORAL_CAPSULE | Freq: Two times a day (BID) | ORAL | 0 refills | Status: DC

## 2022-01-11 MED ORDER — CEPHALEXIN 500 MG PO CAPS: 500.0000 mg | ORAL_CAPSULE | Freq: Two times a day (BID) | ORAL | 0 refills | Status: AC

## 2022-01-11 MED ORDER — KETOROLAC TROMETHAMINE 60 MG/2ML IM SOLN
30.0000 mg | Freq: Once | INTRAMUSCULAR | Status: AC
  Administered 2022-01-11: 30 mg via INTRAMUSCULAR
  Filled 2022-01-11: qty 2

## 2022-01-11 MED ORDER — NAPROXEN 500 MG PO TABS: 500.0000 mg | ORAL_TABLET | Freq: Two times a day (BID) | ORAL | 0 refills | Status: DC

## 2022-01-11 MED ORDER — METHYLPREDNISOLONE 4 MG PO TBPK: ORAL_TABLET | ORAL | 0 refills | Status: DC

## 2022-01-11 NOTE — ED Provider Notes (Signed)
[facility] EMERGENCY DEPT Provider Note   CSN: [id number] Arrival date & time: 01/11/22  2021     History {Add pertinent medical, surgical, social history, OB history to HPI:1} Chief Complaint  Patient presents with   Foot Pain    Robert Stout is a 46 y.o. male.   Foot Pain       Home Medications Prior to Admission medications   Medication Sig Start Date End Date Taking? Authorizing Provider  albuterol (VENTOLIN HFA) 108 (90 Base) MCG/ACT inhaler Inhale 2 puffs into the lungs every 6 (six) hours as needed for wheezing or shortness of breath. 09/01/20   Claiborne Rigg, NP  allopurinol (ZYLOPRIM) 100 MG tablet Take 1 tablet (100 mg total) by mouth daily. Patient not taking: Reported on 10/07/2021 09/01/20   Claiborne Rigg, NP  atorvastatin (LIPITOR) 20 MG tablet Take 1 tablet (20 mg total) by mouth daily. 12/30/21 03/30/22  Randall Hiss, MD  bictegravir-emtricitabine-tenofovir AF (BIKTARVY) 50-200-25 MG TABS tablet Take 1 tablet by mouth daily. 10/07/21   Randall Hiss, MD  imiquimod (ALDARA) 5 % cream APPLY CONTENTS OF 1 PACKET TOPICALLY TO THE AFFECTED AREA THREE TIMES PER WEEK 02/08/20   Daiva Eves, Lisette Grinder, MD  loratadine (CLARITIN) 10 MG tablet Take 1 tablet (10 mg total) by mouth daily. 09/01/20   Claiborne Rigg, NP  mometasone-formoterol (DULERA) 100-5 MCG/ACT AERO Inhale 2 puffs into the lungs 2 (two) times daily. NEEDS PASS 09/01/20   Claiborne Rigg, NP  terbinafine (LAMISIL) 250 MG tablet Take 1 tablet (250 mg total) by mouth daily. 07/28/21   Vivi Barrack, DPM  valACYclovir (VALTREX) 500 MG tablet Take 1 tablet (500 mg total) by mouth 2 (two) times daily. 1 TABLET DAILY 08/07/18   Daiva Eves, Lisette Grinder, MD      Allergies    Patient has no known allergies.    Review of Systems   Review of Systems  Physical Exam Updated Vital Signs BP (!) 146/93 (BP Location: Left Arm)   Pulse 86   Temp 99.6 F (37.6 C) (Oral)   Resp 18   Ht  5\' 10"  (1.778 m)   Wt 117.9 kg   SpO2 99%   BMI 37.31 kg/m  Physical Exam  ED Results / Procedures / Treatments   Labs (all labs ordered are listed, but only abnormal results are displayed) Labs Reviewed - No data to display  EKG None  Radiology DG Foot Complete Right  Result Date: 01/11/2022 CLINICAL DATA:  Right foot pain, maximally in the arch EXAM: RIGHT FOOT COMPLETE - 3 VIEW COMPARISON:  Right foot radiographs dated 07/24/2021 FINDINGS: There are no findings of fracture or dislocation. Accessory ossicle lateral to the cuboid. Os trigonum. Mild soft tissue swelling about the medial midfoot. No radiographic findings of gout. IMPRESSION: Mild soft tissue swelling about the medial midfoot. No radiographic findings of gout. Electronically Signed   By: 09/23/2021 M.D.   On: 01/11/2022 21:21    Procedures Procedures  {Document cardiac monitor, telemetry assessment procedure when appropriate:1}  Medications Ordered in ED Medications - No data to display  ED Course/ Medical Decision Making/ A&P                           Medical Decision Making Amount and/or Complexity of Data Reviewed Radiology: ordered.   ***  {Document critical care time when appropriate:1} {Document review of labs and clinical decision  tools ie heart score, Chads2Vasc2 etc:1}  {Document your independent review of radiology images, and any outside records:1} {Document your discussion with family members, caretakers, and with consultants:1} {Document social determinants of health affecting pt's care:1} {Document your decision making why or why not admission, treatments were needed:1} Final Clinical Impression(s) / ED Diagnoses Final diagnoses:  None    Rx / DC Orders ED Discharge Orders     None

## 2022-01-11 NOTE — Discharge Instructions (Addendum)
Your symptoms could be due to plantar fasciitis, gout, or could be developing infection in your foot. Recommend NSAIDS for pain control, steroids, and antibiotics. Follow-up with your PCP or this ED if symptoms do not improve.

## 2022-01-11 NOTE — ED Notes (Signed)
Pt verbalized understanding all of  dc instructions

## 2022-01-11 NOTE — ED Triage Notes (Addendum)
Pt c/o Rt foot pain, states he feels it may be "gout" Unable bear weight Foot slightly swollen majority of pain is in his arch

## 2022-01-29 ENCOUNTER — Ambulatory Visit (INDEPENDENT_AMBULATORY_CARE_PROVIDER_SITE_OTHER): Payer: Commercial Managed Care - HMO | Admitting: Podiatry

## 2022-01-29 DIAGNOSIS — B351 Tinea unguium: Secondary | ICD-10-CM

## 2022-01-29 DIAGNOSIS — M1000 Idiopathic gout, unspecified site: Secondary | ICD-10-CM | POA: Diagnosis not present

## 2022-01-29 DIAGNOSIS — Z79899 Other long term (current) drug therapy: Secondary | ICD-10-CM

## 2022-01-29 NOTE — Patient Instructions (Signed)
Terbinafine Tablets What is this medication? TERBINAFINE (TER bin a feen) treats fungal infections of the nails. It belongs to a group of medications called antifungals. It will not treat infections caused by bacteria or viruses. This medicine may be used for other purposes; ask your health care provider or pharmacist if you have questions. COMMON BRAND NAME(S): Lamisil, Terbinex What should I tell my care team before I take this medication? They need to know if you have any of these conditions: Liver disease An unusual or allergic reaction to terbinafine, other medications, foods, dyes, or preservatives Pregnant or trying to get pregnant Breast-feeding How should I use this medication? Take this medication by mouth with water. Take it as directed on the prescription label at the same time every day. You can take it with or without food. If it upsets your stomach, take it with food. Keep taking it unless your care team tells you to stop. A special MedGuide will be given to you by the pharmacist with each prescription and refill. Be sure to read this information carefully each time. Talk to your care team regarding the use of this medication in children. Special care may be needed. Overdosage: If you think you have taken too much of this medicine contact a poison control center or emergency room at once. NOTE: This medicine is only for you. Do not share this medicine with others. What if I miss a dose? If you miss a dose, take it as soon as you can unless it is more than 4 hours late. If it is more than 4 hours late, skip the missed dose. Take the next dose at the normal time. What may interact with this medication? Do not take this medication with any of the following: Pimozide Thioridazine This medication may also interact with the following: Beta blockers Caffeine Certain medications for mental health conditions Cimetidine Cyclosporine Medications for fungal infections like fluconazole  and ketoconazole Medications for irregular heartbeat like amiodarone, flecainide and propafenone Rifampin Warfarin This list may not describe all possible interactions. Give your health care provider a list of all the medicines, herbs, non-prescription drugs, or dietary supplements you use. Also tell them if you smoke, drink alcohol, or use illegal drugs. Some items may interact with your medicine. What should I watch for while using this medication? Visit your care team for regular checks on your progress. You may need blood work while you are taking this medication. It may be some time before you see the benefit from this medication. This medication may cause serious skin reactions. They can happen weeks to months after starting the medication. Contact your care team right away if you notice fevers or flu-like symptoms with a rash. The rash may be red or purple and then turn into blisters or peeling of the skin. Or, you might notice a red rash with swelling of the face, lips or lymph nodes in your neck or under your arms. This medication can make you more sensitive to the sun. Keep out of the sun, If you cannot avoid being in the sun, wear protective clothing and sunscreen. Do not use sun lamps or tanning beds/booths. What side effects may I notice from receiving this medication? Side effects that you should report to your care team as soon as possible: Allergic reactions--skin rash, itching, hives, swelling of the face, lips, tongue, or throat Change in sense of smell Change in taste Infection--fever, chills, cough, or sore throat Liver injury--right upper belly pain, loss of appetite, nausea,   light-colored stool, dark yellow or brown urine, yellowing skin or eyes, unusual weakness or fatigue Low red blood cell level--unusual weakness or fatigue, dizziness, headache, trouble breathing Lupus-like syndrome--joint pain, swelling, or stiffness, butterfly-shaped rash on the face, rashes that get worse  in the sun, fever, unusual weakness or fatigue Rash, fever, and swollen lymph nodes Redness, blistering, peeling, or loosening of the skin, including inside the mouth Unusual bruising or bleeding Worsening mood, feelings of depression Side effects that usually do not require medical attention (report to your care team if they continue or are bothersome): Diarrhea Gas Headache Nausea Stomach pain Upset stomach This list may not describe all possible side effects. Call your doctor for medical advice about side effects. You may report side effects to FDA at 1-800-FDA-1088. Where should I keep my medication? Keep out of the reach of children and pets. Store between 20 and 25 degrees C (68 and 77 degrees F). Protect from light. Get rid of any unused medication after the expiration date. To get rid of medications that are no longer needed or have expired: Take the medication to a medication take-back program. Check with your pharmacy or law enforcement to find a location. If you cannot return the medication, check the label or package insert to see if the medication should be thrown out in the garbage or flushed down the toilet. If you are not sure, ask your care team. If it is safe to put it in the trash, take the medication out of the container. Mix the medication with cat litter, dirt, coffee grounds, or other unwanted substance. Seal the mixture in a bag or container. Put it in the trash. NOTE: This sheet is a summary. It may not cover all possible information. If you have questions about this medicine, talk to your doctor, pharmacist, or health care provider.  2023 Elsevier/Gold Standard (2020-09-02 00:00:00)  

## 2022-01-29 NOTE — Progress Notes (Unsigned)
Subjective: Chief Complaint  Patient presents with   Foot Pain    Patient came in today for a follow-up patient is still having some pain in the right foot arch swelling, Rate of pain 10 out 10 when having pain, patient went to the ED  01/11/2022, X-Rays taken in the ED, [place]: Merdol dosepack, Naproxen     He was getting pain to the arch. No pain currently  Has about 20 more pills of lamisil- not taking reg  Denies any systemic complaints such as fevers, chills, nausea, vomiting. No acute changes since last appointment, and no other complaints at this time.   Objective: AAO x3, NAD DP/PT pulses palpable bilaterally, CRT less than 3 seconds Protective sensation intact with Simms Weinstein monofilament, vibratory sensation intact, Achilles tendon reflex intact No areas of pinpoint bony tenderness or pain with vibratory sensation. MMT 5/5, ROM WNL. No edema, erythema, increase in warmth to bilateral lower extremities.  No open lesions or pre-ulcerative lesions.  No pain with calf compression, swelling, warmth, erythema  Assessment:  Plan: -All treatment options discussed with the patient including all alternatives, risks, complications.   -Patient encouraged to call the office with any questions, concerns, change in symptoms.

## 2022-02-23 ENCOUNTER — Other Ambulatory Visit: Payer: Commercial Managed Care - HMO

## 2022-02-23 ENCOUNTER — Other Ambulatory Visit: Payer: Self-pay

## 2022-02-23 DIAGNOSIS — B20 Human immunodeficiency virus [HIV] disease: Secondary | ICD-10-CM

## 2022-02-23 DIAGNOSIS — Z79899 Other long term (current) drug therapy: Secondary | ICD-10-CM

## 2022-02-24 LAB — CBC WITH DIFFERENTIAL/PLATELET
Absolute Monocytes: 457 cells/uL (ref 200–950)
Basophils Absolute: 39 cells/uL (ref 0–200)
Basophils Relative: 0.7 %
Eosinophils Absolute: 110 cells/uL (ref 15–500)
Eosinophils Relative: 2 %
HCT: 38.5 % (ref 38.5–50.0)
Hemoglobin: 13.1 g/dL — ABNORMAL LOW (ref 13.2–17.1)
Lymphs Abs: 2585 cells/uL (ref 850–3900)
MCH: 31.1 pg (ref 27.0–33.0)
MCHC: 34 g/dL (ref 32.0–36.0)
MCV: 91.4 fL (ref 80.0–100.0)
MPV: 9.8 fL (ref 7.5–12.5)
Monocytes Relative: 8.3 %
Neutro Abs: 2310 cells/uL (ref 1500–7800)
Neutrophils Relative %: 42 %
Platelets: 292 10*3/uL (ref 140–400)
RBC: 4.21 10*6/uL (ref 4.20–5.80)
RDW: 12.7 % (ref 11.0–15.0)
Total Lymphocyte: 47 %
WBC: 5.5 10*3/uL (ref 3.8–10.8)

## 2022-02-24 LAB — COMPLETE METABOLIC PANEL WITH GFR
AG Ratio: 1.4 (calc) (ref 1.0–2.5)
ALT: 18 U/L (ref 9–46)
AST: 19 U/L (ref 10–40)
Albumin: 4.1 g/dL (ref 3.6–5.1)
Alkaline phosphatase (APISO): 65 U/L (ref 36–130)
BUN/Creatinine Ratio: 5 (calc) — ABNORMAL LOW (ref 6–22)
BUN: 7 mg/dL (ref 7–25)
CO2: 29 mmol/L (ref 20–32)
Calcium: 9.2 mg/dL (ref 8.6–10.3)
Chloride: 105 mmol/L (ref 98–110)
Creat: 1.3 mg/dL — ABNORMAL HIGH (ref 0.60–1.29)
Globulin: 2.9 g/dL (calc) (ref 1.9–3.7)
Glucose, Bld: 97 mg/dL (ref 65–99)
Potassium: 4.2 mmol/L (ref 3.5–5.3)
Sodium: 140 mmol/L (ref 135–146)
Total Bilirubin: 0.5 mg/dL (ref 0.2–1.2)
Total Protein: 7 g/dL (ref 6.1–8.1)
eGFR: 68 mL/min/{1.73_m2} (ref >=60)

## 2022-02-24 LAB — LIPID PANEL
Cholesterol: 131 mg/dL (ref <200)
HDL: 35 mg/dL — ABNORMAL LOW (ref >=40)
LDL Cholesterol (Calc): 77 mg/dL (calc)
Non-HDL Cholesterol (Calc): 96 mg/dL (calc) (ref <130)
Total CHOL/HDL Ratio: 3.7 (calc) (ref <5.0)
Triglycerides: 111 mg/dL (ref <150)

## 2022-02-24 LAB — T-HELPER CELL (CD4) - (RCID CLINIC ONLY)
CD4 % Helper T Cell: 35 % (ref 33–65)
CD4 T Cell Abs: 871 /uL (ref 400–1790)

## 2022-02-24 LAB — HIV-1 RNA QUANT-NO REFLEX-BLD
HIV 1 RNA Quant: NOT DETECTED Copies/mL
HIV-1 RNA Quant, Log: NOT DETECTED Log cps/mL

## 2022-03-07 ENCOUNTER — Encounter: Payer: Self-pay | Admitting: Infectious Disease

## 2022-03-07 DIAGNOSIS — E785 Hyperlipidemia, unspecified: Secondary | ICD-10-CM

## 2022-03-07 HISTORY — DX: Hyperlipidemia, unspecified: E78.5

## 2022-03-07 NOTE — Progress Notes (Unsigned)
Subjective:   Chief complaint: Follow-up for HIV disease on medications:  Patient ID: Robert Stout, male    DOB: Oct 11, 1975, 47 y.o.   MRN: [id number]     Robert Stout is a 47 year-old African-American man living with HIV that is typically been well controlled most recently on Biktarvy.  He says he is highly adherent to his ARV.      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   HIV infection ([place]) Dx 2011   Hyperglycemia 09/01/2020   Rectal bleeding 10/09/2018   Vaccine counseling 04/05/2021   Weight gain 10/09/2018    Past Surgical History:  Procedure Laterality Date   TENDON REPAIR      Family History  Problem Relation Age of Onset   Diabetes Mother    Hypertension Mother       Social History   Socioeconomic History   Marital status: Single    Spouse name: Not on file   Number of children: Not on file   Years of education: Not on file   Highest education level: Not on file  Occupational History   Not on file  Tobacco Use   Smoking status: Never   Smokeless tobacco: Never  Vaping Use   Vaping Use: Never used  Substance and Sexual Activity   Alcohol use: No    Alcohol/week: 0.0 standard drinks of alcohol   Drug use: No   Sexual activity: Not Currently  Other Topics Concern   Not on file  Social History Narrative   Not on file   Social Determinants of Health   Financial Resource Strain: Not on file  Food Insecurity: Not on file  Transportation Needs: Not on file  Physical Activity: Not on file  Stress: Not on file  Social Connections: Not on file    No Known Allergies   Current Outpatient Medications:    albuterol (VENTOLIN HFA) 108 (90 Base) MCG/ACT inhaler, Inhale 2 puffs into the lungs every 6 (six) hours as needed for wheezing or shortness of breath., Disp: 18 g, Rfl: 1    allopurinol (ZYLOPRIM) 100 MG tablet, Take 1 tablet (100 mg total) by mouth daily. (Patient not taking: Reported on 10/07/2021), Disp: 90 tablet, Rfl: 2   atorvastatin (LIPITOR) 20 MG tablet, Take 1 tablet (20 mg total) by mouth daily., Disp: 90 tablet, Rfl: 1   bictegravir-emtricitabine-tenofovir AF (BIKTARVY) 50-200-25 MG TABS tablet, Take 1 tablet by mouth daily., Disp: 30 tablet, Rfl: 11   imiquimod (ALDARA) 5 % cream, APPLY CONTENTS OF 1 PACKET TOPICALLY TO THE AFFECTED AREA THREE TIMES PER WEEK, Disp: 12 each, Rfl: 3   loratadine (CLARITIN) 10 MG tablet, Take 1 tablet (10 mg total) by mouth daily., Disp: 30 tablet, Rfl: 11   methylPREDNISolone (MEDROL DOSEPAK) 4 MG TBPK tablet, Take as directed on the box, Disp: 1 each, Rfl: 0   mometasone-formoterol (DULERA) 100-5 MCG/ACT AERO, Inhale 2 puffs into the lungs 2 (two) times daily. NEEDS PASS, Disp: 13 g, Rfl: 11   terbinafine (LAMISIL) 250 MG tablet, Take 1 tablet (250 mg total) by mouth daily., Disp: 90 tablet, Rfl: 0   valACYclovir (VALTREX) 500 MG tablet, Take 1 tablet (500 mg total) by mouth 2 (two) times daily. 1 TABLET DAILY, Disp: 14 tablet, Rfl: 0    Review of Systems  Objective:   Physical Exam        Assessment & Plan:   HIV disease:  I have reviewed Robert Stout's labs including viral load which was  Lab Results  Component Value Date   HIV1RNAQUANT Not Detected 02/23/2022   and cd4 which was  Lab Results  Component Value Date   CD4TABS 871 02/23/2022     I am continuing patient's prescription for [facility] prescription   Lab Results  Component Value Date   HIV1RNAQUANT Not Detected 02/23/2022   Lab Results  Component Value Date   CD4TABS 871 02/23/2022   CD4TABS 873 09/28/2021   CD4TABS 997 03/18/2021    I am continue his Biktarvy  Asthma:  He should and continue inhaled Dulera and albuterol MDI  Hyperlipidemia:   Lipid Panel   I have reviewed his lipid panel    Component Value Date/Time    CHOL 131 02/23/2022 0943   TRIG 111 02/23/2022 0943   HDL 35 (L) 02/23/2022 0943   CHOLHDL 3.7 02/23/2022 0943   VLDL 42 (H) 07/15/2016 1505   [place] 77 02/23/2022 0943   I will continue his atorvastatin    STI screen:   Vaccine couselling: recommended annual flu vaccine, updated COVID 19 vaccine, Tetanus vaccine  Colon cancer screening: he should have colonscopy

## 2022-03-08 ENCOUNTER — Other Ambulatory Visit: Payer: Self-pay

## 2022-03-08 ENCOUNTER — Ambulatory Visit (INDEPENDENT_AMBULATORY_CARE_PROVIDER_SITE_OTHER): Payer: Commercial Managed Care - HMO | Admitting: Infectious Disease

## 2022-03-08 ENCOUNTER — Encounter: Payer: Self-pay | Admitting: Infectious Disease

## 2022-03-08 ENCOUNTER — Ambulatory Visit (INDEPENDENT_AMBULATORY_CARE_PROVIDER_SITE_OTHER): Payer: Commercial Managed Care - HMO

## 2022-03-08 VITALS — BP 114/68 | HR 80 | Temp 96.8°F | Ht 70.0 in | Wt 276.0 lb

## 2022-03-08 DIAGNOSIS — Z1211 Encounter for screening for malignant neoplasm of colon: Secondary | ICD-10-CM

## 2022-03-08 DIAGNOSIS — Z23 Encounter for immunization: Secondary | ICD-10-CM

## 2022-03-08 DIAGNOSIS — J45909 Unspecified asthma, uncomplicated: Secondary | ICD-10-CM

## 2022-03-08 DIAGNOSIS — E785 Hyperlipidemia, unspecified: Secondary | ICD-10-CM | POA: Diagnosis not present

## 2022-03-08 DIAGNOSIS — B20 Human immunodeficiency virus [HIV] disease: Secondary | ICD-10-CM | POA: Diagnosis not present

## 2022-03-08 DIAGNOSIS — R112 Nausea with vomiting, unspecified: Secondary | ICD-10-CM

## 2022-03-08 DIAGNOSIS — M109 Gout, unspecified: Secondary | ICD-10-CM | POA: Diagnosis not present

## 2022-03-08 DIAGNOSIS — Z7185 Encounter for immunization safety counseling: Secondary | ICD-10-CM

## 2022-03-08 DIAGNOSIS — Z6837 Body mass index (BMI) 37.0-37.9, adult: Secondary | ICD-10-CM

## 2022-03-08 LAB — COMPREHENSIVE METABOLIC PANEL
AG Ratio: 1.4 (calc) (ref 1.0–2.5)
ALT: 19 U/L (ref 9–46)
AST: 21 U/L (ref 10–40)
Albumin: 4.5 g/dL (ref 3.6–5.1)
Alkaline phosphatase (APISO): 76 U/L (ref 36–130)
BUN: 11 mg/dL (ref 7–25)
CO2: 27 mmol/L (ref 20–32)
Calcium: 9.7 mg/dL (ref 8.6–10.3)
Chloride: 103 mmol/L (ref 98–110)
Creat: 1.28 mg/dL (ref 0.60–1.29)
Globulin: 3.3 g/dL (calc) (ref 1.9–3.7)
Glucose, Bld: 124 mg/dL — ABNORMAL HIGH (ref 65–99)
Potassium: 4.1 mmol/L (ref 3.5–5.3)
Sodium: 137 mmol/L (ref 135–146)
Total Bilirubin: 0.6 mg/dL (ref 0.2–1.2)
Total Protein: 7.8 g/dL (ref 6.1–8.1)

## 2022-03-08 LAB — LIPASE: Lipase: 40 U/L (ref 7–60)

## 2022-03-08 LAB — URIC ACID: Uric Acid, Serum: 8.6 mg/dL — ABNORMAL HIGH (ref 4.0–8.0)

## 2022-03-08 MED ORDER — ATORVASTATIN CALCIUM 20 MG PO TABS: 20.0000 mg | ORAL_TABLET | Freq: Every day | ORAL | 11 refills | Status: DC

## 2022-03-08 MED ORDER — COLCHICINE 0.6 MG PO TABS: 0.6000 mg | ORAL_TABLET | Freq: Every day | ORAL | 11 refills | Status: DC

## 2022-03-08 MED ORDER — BIKTARVY 50-200-25 MG PO TABS: 1.0000 | ORAL_TABLET | Freq: Every day | ORAL | 11 refills | Status: DC

## 2022-03-08 NOTE — Patient Instructions (Signed)
HAPPY BIRTHDAY!!!

## 2022-03-09 ENCOUNTER — Ambulatory Visit: Payer: 59 | Admitting: Infectious Disease

## 2022-03-29 ENCOUNTER — Ambulatory Visit: Payer: Commercial Managed Care - HMO | Admitting: Nurse Practitioner

## 2022-04-19 ENCOUNTER — Other Ambulatory Visit (HOSPITAL_COMMUNITY): Payer: Self-pay

## 2022-08-31 ENCOUNTER — Encounter: Payer: Self-pay | Admitting: Infectious Disease

## 2022-08-31 ENCOUNTER — Other Ambulatory Visit: Payer: Self-pay

## 2022-08-31 ENCOUNTER — Other Ambulatory Visit (HOSPITAL_COMMUNITY)
Admission: RE | Admit: 2022-08-31 | Discharge: 2022-08-31 | Disposition: A | Discharge status: Home / Self Care | Payer: Commercial Managed Care - HMO | Source: Ambulatory Visit | Attending: Infectious Disease | Admitting: Infectious Disease

## 2022-08-31 ENCOUNTER — Ambulatory Visit (INDEPENDENT_AMBULATORY_CARE_PROVIDER_SITE_OTHER): Payer: Commercial Managed Care - HMO | Admitting: Infectious Disease

## 2022-08-31 VITALS — BP 111/80 | HR 68 | Resp 16 | Ht 70.0 in | Wt 264.0 lb

## 2022-08-31 DIAGNOSIS — E785 Hyperlipidemia, unspecified: Secondary | ICD-10-CM | POA: Diagnosis not present

## 2022-08-31 DIAGNOSIS — M109 Gout, unspecified: Secondary | ICD-10-CM

## 2022-08-31 DIAGNOSIS — J4520 Mild intermittent asthma, uncomplicated: Secondary | ICD-10-CM | POA: Diagnosis not present

## 2022-08-31 DIAGNOSIS — R635 Abnormal weight gain: Secondary | ICD-10-CM

## 2022-08-31 DIAGNOSIS — A549 Gonococcal infection, unspecified: Secondary | ICD-10-CM

## 2022-08-31 DIAGNOSIS — B20 Human immunodeficiency virus [HIV] disease: Secondary | ICD-10-CM | POA: Insufficient documentation

## 2022-08-31 DIAGNOSIS — J209 Acute bronchitis, unspecified: Secondary | ICD-10-CM

## 2022-08-31 MED ORDER — COLCHICINE 0.6 MG PO TABS: 0.6000 mg | ORAL_TABLET | Freq: Every day | ORAL | 11 refills | Status: AC

## 2022-08-31 MED ORDER — BIKTARVY 50-200-25 MG PO TABS: 1.0000 | ORAL_TABLET | Freq: Every day | ORAL | 11 refills | Status: DC

## 2022-08-31 MED ORDER — DULERA 100-5 MCG/ACT IN AERO: 2.0000 | INHALATION_SPRAY | Freq: Two times a day (BID) | RESPIRATORY_TRACT | 11 refills | Status: DC

## 2022-08-31 MED ORDER — ALBUTEROL SULFATE HFA 108 (90 BASE) MCG/ACT IN AERS: 2.0000 | INHALATION_SPRAY | Freq: Four times a day (QID) | RESPIRATORY_TRACT | 6 refills | Status: DC | PRN

## 2022-08-31 MED ORDER — ATORVASTATIN CALCIUM 20 MG PO TABS: 20.0000 mg | ORAL_TABLET | Freq: Every day | ORAL | 11 refills | Status: DC

## 2022-08-31 NOTE — Progress Notes (Signed)
Subjective:   Chief complaint: Follow-up for HIV disease on medications    Patient ID: Robert Stout, male    DOB: [birth year], 47 y.o.   MRN: [id number]   "Robert Stout" which is what [name] goes by is a 47 year old--with birthday today with HIV well, controlled on Biktarvy.  Had some problems with gout flares and I had started him on colchicine but he did not appear to be taking it.  He says he had several gout flares recently and is taken nonsteroidals for them.  He has had a history of HPV lesions and previously screened for anal cancer with anal Pap smear but he deferred that today.      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   HIV infection (HCC) Dx 2011   Hyperglycemia 09/01/2020   Hyperlipidemia 03/07/2022   Rectal bleeding 10/09/2018   Vaccine counseling 04/05/2021   Weight gain 10/09/2018    Past Surgical History:  Procedure Laterality Date   TENDON REPAIR      Family History  Problem Relation Age of Onset   Diabetes Mother    Hypertension Mother       Social History   Socioeconomic History   Marital status: Single    Spouse name: Not on file   Number of children: Not on file   Years of education: Not on file   Highest education level: Not on file  Occupational History   Not on file  Tobacco Use   Smoking status: Never    Passive exposure: Never   Smokeless tobacco: Never  Vaping Use   Vaping Use: Never used  Substance and Sexual Activity   Alcohol use: No    Alcohol/week: 0.0 standard drinks of alcohol   Drug use: No   Sexual activity: Not Currently    Comment: declined condoms  Other Topics Concern   Not on file  Social History Narrative   Not on file   Social Determinants of Health   Financial Resource Strain: Not on file  Food Insecurity: Not on file   Transportation Needs: Not on file  Physical Activity: Not on file  Stress: Not on file  Social Connections: Not on file    No Known Allergies   Current Outpatient Medications:    albuterol (VENTOLIN HFA) 108 (90 Base) MCG/ACT inhaler, Inhale 2 puffs into the lungs every 6 (six) hours as needed for wheezing or shortness of breath., Disp: 18 g, Rfl: 1   allopurinol (ZYLOPRIM) 100 MG tablet, Take 1 tablet (100 mg total) by mouth daily., Disp: 90 tablet, Rfl: 2   atorvastatin (LIPITOR) 20 MG tablet, Take 1 tablet (20 mg total) by mouth daily., Disp: 30 tablet, Rfl: 11   bictegravir-emtricitabine-tenofovir AF (BIKTARVY) 50-200-25 MG TABS tablet, Take 1 tablet by mouth daily., Disp: 30 tablet, Rfl: 11   colchicine 0.6 MG tablet, Take 1 tablet (0.6 mg total) by mouth daily., Disp: 30 tablet, Rfl: 11   imiquimod (ALDARA) 5 % cream, APPLY CONTENTS OF 1 PACKET TOPICALLY TO THE AFFECTED AREA THREE TIMES PER WEEK, Disp: 12 each, Rfl: 3   loratadine (CLARITIN) 10 MG tablet, Take 1 tablet (10 mg total) by mouth daily., Disp: 30 tablet, Rfl: 11   mometasone-formoterol (DULERA) 100-5 MCG/ACT AERO, Inhale 2 puffs into the lungs 2 (two) times daily.  NEEDS PASS, Disp: 13 g, Rfl: 11   valACYclovir (VALTREX) 500 MG tablet, Take 1 tablet (500 mg total) by mouth 2 (two) times daily. 1 TABLET DAILY, Disp: 14 tablet, Rfl: 0   terbinafine (LAMISIL) 250 MG tablet, Take 1 tablet (250 mg total) by mouth daily. (Patient not taking: Reported on 03/08/2022), Disp: 90 tablet, Rfl: 0    Review of Systems  Constitutional:  Negative for activity change, appetite change, chills, diaphoresis, fatigue, fever and unexpected weight change.  HENT:  Negative for congestion, rhinorrhea, sinus pressure, sneezing, sore throat and trouble swallowing.   Eyes:  Negative for photophobia and visual disturbance.  Respiratory:  Negative for cough, chest tightness, shortness of breath, wheezing and stridor.   Cardiovascular:  Negative for  chest pain, palpitations and leg swelling.  Gastrointestinal:  Negative for abdominal distention, abdominal pain, anal bleeding, blood in stool, constipation, diarrhea, nausea and vomiting.  Genitourinary:  Negative for difficulty urinating, dysuria, flank pain and hematuria.  Musculoskeletal:  Negative for arthralgias, back pain, gait problem, joint swelling and myalgias.  Skin:  Negative for color change, pallor, rash and wound.  Neurological:  Negative for dizziness, tremors, weakness and light-headedness.  Hematological:  Negative for adenopathy. Does not bruise/bleed easily.  Psychiatric/Behavioral:  Negative for agitation, behavioral problems, confusion, decreased concentration, dysphoric mood and sleep disturbance.        Objective:   Physical Exam Constitutional:      Appearance: He is well-developed.  HENT:     Head: Normocephalic and atraumatic.  Eyes:     Conjunctiva/sclera: Conjunctivae normal.  Cardiovascular:     Rate and Rhythm: Normal rate and regular rhythm.  Pulmonary:     Effort: Pulmonary effort is normal. No respiratory distress.     Breath sounds: No wheezing.  Abdominal:     General: There is no distension.     Palpations: Abdomen is soft.  Musculoskeletal:        General: No tenderness. Normal range of motion.     Cervical back: Normal range of motion and neck supple.  Skin:    General: Skin is warm and dry.     Coloration: Skin is not pale.     Findings: No erythema or rash.  Neurological:     General: No focal deficit present.     Mental Status: He is alert and oriented to person, place, and time.  Psychiatric:        Mood and Affect: Mood normal.        Behavior: Behavior normal.        Thought Content: Thought content normal.        Judgment: Judgment normal.           Assessment & Plan:   HIV disease:  I will add order HIV viral load CD4 count CBC with differential CMP, RPR GC and chlamydia and I will continue  [facility],  prescription  Hyperlipidemia we will continue his atorvastatin  Gout will initiate low-dose colchicine 0.6 mg daily and recheck uric acid today and at next visit  Asthma sending in [facility] prescription as well as albuterol metered-dose inhaler  Hyperlipidemia lipid panel is encouraging from last visit we will recheck and continue atorvastatin   Anal cancer screening: recommended anal pap  Weight gain: check A1c

## 2022-09-01 ENCOUNTER — Other Ambulatory Visit (HOSPITAL_COMMUNITY): Payer: Self-pay

## 2022-09-01 ENCOUNTER — Other Ambulatory Visit: Payer: Self-pay

## 2022-09-01 ENCOUNTER — Telehealth: Payer: Self-pay

## 2022-09-01 DIAGNOSIS — J4520 Mild intermittent asthma, uncomplicated: Secondary | ICD-10-CM

## 2022-09-01 LAB — T-HELPER CELLS (CD4) COUNT (NOT AT ARMC)
CD4 % Helper T Cell: 38 % (ref 33–65)
CD4 T Cell Abs: 746 /uL (ref 400–1790)

## 2022-09-01 LAB — URINE CYTOLOGY ANCILLARY ONLY
Chlamydia: NEGATIVE
Comment: NEGATIVE
Comment: NORMAL
Neisseria Gonorrhea: NEGATIVE

## 2022-09-01 MED ORDER — DULERA 100-5 MCG/ACT IN AERO
2.0000 | INHALATION_SPRAY | Freq: Two times a day (BID) | RESPIRATORY_TRACT | 11 refills | Status: DC
Start: 2022-09-01 — End: 2023-09-05
  Filled 2022-09-01: qty 13, 25d supply, fill #0
  Filled 2022-09-01 – 2023-02-24 (×3): qty 13, 30d supply, fill #0
  Filled 2023-08-24: qty 13, 30d supply, fill #1

## 2022-09-01 NOTE — Telephone Encounter (Signed)
Called patient to review labs. States he just restarted his Statin last month. Is not working with PCP at this moment. Will provide him with number for [facility].  Patient also requesting Dulera inhaler [facility]. Juanita Laster, RMA

## 2022-09-01 NOTE — Telephone Encounter (Signed)
-----   Message from Randall Hiss, MD sent at 09/01/2022  1:21 PM EDT ----- Is he taking his statin? His LDL was higher now. He also is on verge of beign diabetic with A1c in prediabetic range but worse than before. Does he have primary care too? ----- Message ----- From: Interface, [facility] Lab Results In Sent: 08/31/2022   4:32 PM EDT To: Randall Hiss, MD

## 2022-09-02 ENCOUNTER — Telehealth: Payer: Self-pay

## 2022-09-02 NOTE — Telephone Encounter (Signed)
Left voicemail requesting patient call office back to review recent RPR titer. Per Dr. Daiva Eves he needs 2.4 MU PCN only needs one dose since it was NR on January 15th   Juanita Laster, [place]

## 2022-09-03 LAB — COMPLETE METABOLIC PANEL WITH GFR
AG Ratio: 1 (calc) (ref 1.0–2.5)
ALT: 15 U/L (ref 9–46)
AST: 22 U/L (ref 10–40)
Albumin: 4 g/dL (ref 3.6–5.1)
Alkaline phosphatase (APISO): 68 U/L (ref 36–130)
BUN: 11 mg/dL (ref 7–25)
CO2: 26 mmol/L (ref 20–32)
Calcium: 9.2 mg/dL (ref 8.6–10.3)
Chloride: 104 mmol/L (ref 98–110)
Creat: 1.24 mg/dL (ref 0.60–1.29)
Globulin: 3.9 g/dL (calc) — ABNORMAL HIGH (ref 1.9–3.7)
Glucose, Bld: 90 mg/dL (ref 65–99)
Potassium: 3.7 mmol/L (ref 3.5–5.3)
Sodium: 139 mmol/L (ref 135–146)
Total Bilirubin: 0.2 mg/dL (ref 0.2–1.2)
Total Protein: 7.9 g/dL (ref 6.1–8.1)
eGFR: 71 mL/min/{1.73_m2} (ref >=60)

## 2022-09-03 LAB — CBC WITH DIFFERENTIAL/PLATELET
Absolute Monocytes: 439 cells/uL (ref 200–950)
Basophils Absolute: 51 cells/uL (ref 0–200)
Basophils Relative: 1 %
Eosinophils Absolute: 179 cells/uL (ref 15–500)
Eosinophils Relative: 3.5 %
HCT: 38.8 % (ref 38.5–50.0)
Hemoglobin: 13.1 g/dL — ABNORMAL LOW (ref 13.2–17.1)
Lymphs Abs: 2203 cells/uL (ref 850–3900)
MCH: 30.6 pg (ref 27.0–33.0)
MCHC: 33.8 g/dL (ref 32.0–36.0)
MCV: 90.7 fL (ref 80.0–100.0)
MPV: 9.8 fL (ref 7.5–12.5)
Monocytes Relative: 8.6 %
Neutro Abs: 2229 cells/uL (ref 1500–7800)
Neutrophils Relative %: 43.7 %
Platelets: 305 10*3/uL (ref 140–400)
RBC: 4.28 10*6/uL (ref 4.20–5.80)
RDW: 13.3 % (ref 11.0–15.0)
Total Lymphocyte: 43.2 %
WBC: 5.1 10*3/uL (ref 3.8–10.8)

## 2022-09-03 LAB — HEMOGLOBIN A1C
Hgb A1c MFr Bld: 6.4 % of total Hgb — ABNORMAL HIGH (ref <5.7)
Mean Plasma Glucose: 137 mg/dL
eAG (mmol/L): 7.6 mmol/L

## 2022-09-03 LAB — LIPID PANEL
Cholesterol: 169 mg/dL (ref <200)
HDL: 38 mg/dL — ABNORMAL LOW (ref >=40)
LDL Cholesterol (Calc): 96 mg/dL (calc)
Non-HDL Cholesterol (Calc): 131 mg/dL (calc) — ABNORMAL HIGH (ref <130)
Total CHOL/HDL Ratio: 4.4 (calc) (ref <5.0)
Triglycerides: 230 mg/dL — ABNORMAL HIGH (ref <150)

## 2022-09-03 LAB — HIV-1 RNA QUANT-NO REFLEX-BLD
HIV 1 RNA Quant: NOT DETECTED Copies/mL
HIV-1 RNA Quant, Log: NOT DETECTED Log cps/mL

## 2022-09-03 LAB — URIC ACID: Uric Acid, Serum: 7.1 mg/dL (ref 4.0–8.0)

## 2022-09-03 LAB — RPR TITER: RPR Titer: 1:128 {titer} — ABNORMAL HIGH

## 2022-09-03 LAB — RPR: RPR Ser Ql: REACTIVE — AB

## 2022-09-03 LAB — T PALLIDUM AB: T Pallidum Abs: POSITIVE — AB

## 2022-09-06 NOTE — Telephone Encounter (Signed)
Second attempt to reach patient, no answer. Left HIPAA compliant voicemail requesting callback.   Megan D Burroughs, RN  

## 2022-09-07 NOTE — Telephone Encounter (Signed)
Left voicemail regarding results. Per Dr. Renold Don patient will need appointment with provider to discuss results and treatment. Will contact [facility] to see if they can help staff get in touch with patient.  Juanita Laster, RMA

## 2022-09-07 NOTE — Telephone Encounter (Signed)
Third attempt to reach patient, left HIPAA compliant vm.

## 2022-09-08 NOTE — Telephone Encounter (Signed)
Patient scheduled to see Carver Fila, NP tomorrow at 4 pm.

## 2022-09-09 ENCOUNTER — Other Ambulatory Visit: Payer: Self-pay

## 2022-09-09 ENCOUNTER — Ambulatory Visit (INDEPENDENT_AMBULATORY_CARE_PROVIDER_SITE_OTHER): Payer: Commercial Managed Care - HMO | Admitting: Family

## 2022-09-09 ENCOUNTER — Encounter: Payer: Self-pay | Admitting: Family

## 2022-09-09 VITALS — BP 116/78 | HR 79 | Temp 97.6°F | Ht 70.0 in | Wt 265.0 lb

## 2022-09-09 DIAGNOSIS — A515 Early syphilis, latent: Secondary | ICD-10-CM

## 2022-09-09 MED ORDER — PENICILLIN G BENZATHINE 1200000 UNIT/2ML IM SUSY
1.2000 10*6.[IU] | PREFILLED_SYRINGE | Freq: Once | INTRAMUSCULAR | Status: AC
Start: 2022-09-09 — End: 2022-09-09
  Administered 2022-09-09: 1.2 10*6.[IU] via INTRAMUSCULAR

## 2022-09-09 NOTE — Progress Notes (Signed)
Patient ID: Robert Stout, male    DOB: 09/08/1975, 47 y.o.   MRN: [id number]  Subjective:    Chief Complaint  Patient presents with   Follow-up   Syphilis    HPI:  Robert Stout is a 47 y.o. male with HIV disease last seen by Dr. Daiva Eves on 09/01/22 with well controlled virus and good adherence and tolerance to [facility]. Viral load was undetectable and CD4 count 746. Found to have a positive RPR titer at 1:128. Here today for follow up.  Robert Stout denies any recent rashes, lesions, headache, neck pain or changes in vision. Last non-reactive RPR was August 2023.   Denies fevers, chills, night sweats, headaches, changes in vision, neck pain/stiffness, nausea, diarrhea, vomiting, lesions or rashes.  No Known Allergies    Outpatient Medications Prior to Visit  Medication Sig Dispense Refill   albuterol (VENTOLIN HFA) 108 (90 Base) MCG/ACT inhaler Inhale 2 puffs into the lungs every 6 (six) hours as needed for wheezing or shortness of breath. 18 g 6   allopurinol (ZYLOPRIM) 100 MG tablet Take 1 tablet (100 mg total) by mouth daily. 90 tablet 2   atorvastatin (LIPITOR) 20 MG tablet Take 1 tablet (20 mg total) by mouth daily. 30 tablet 11   bictegravir-emtricitabine-tenofovir AF (BIKTARVY) 50-200-25 MG TABS tablet Take 1 tablet by mouth daily. 30 tablet 11   colchicine 0.6 MG tablet Take 1 tablet (0.6 mg total) by mouth daily. 30 tablet 11   imiquimod (ALDARA) 5 % cream APPLY CONTENTS OF 1 PACKET TOPICALLY TO THE AFFECTED AREA THREE TIMES PER WEEK 12 each 3   loratadine (CLARITIN) 10 MG tablet Take 1 tablet (10 mg total) by mouth daily. 30 tablet 11   mometasone-formoterol (DULERA) 100-5 MCG/ACT AERO Inhale 2 puffs into the lungs 2 (two) times daily. NEEDS PASS 13 g 11   valACYclovir (VALTREX) 500 MG tablet Take 1 tablet (500 mg total) by mouth 2 (two) times daily. 1 TABLET DAILY 14 tablet 0   terbinafine (LAMISIL) 250 MG tablet Take 1 tablet (250 mg total) by mouth daily. (Patient not  taking: Reported on 03/08/2022) 90 tablet 0   No facility-administered medications prior to visit.     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   HIV infection (HCC) Dx 2011   Hyperglycemia 09/01/2020   Hyperlipidemia 03/07/2022   Rectal bleeding 10/09/2018   Vaccine counseling 04/05/2021   Weight gain 10/09/2018     Past Surgical History:  Procedure Laterality Date   TENDON REPAIR        Review of Systems  Constitutional:  Negative for appetite change, chills, fatigue, fever and unexpected weight change.  Eyes:  Negative for visual disturbance.  Respiratory:  Negative for cough, chest tightness, shortness of breath and wheezing.   Cardiovascular:  Negative for chest pain and leg swelling.  Gastrointestinal:  Negative for abdominal pain, constipation, diarrhea, nausea and vomiting.  Genitourinary:  Negative for dysuria, flank pain, frequency, genital sores, hematuria and urgency.  Skin:  Negative for rash.  Allergic/Immunologic: Negative for immunocompromised state.  Neurological:  Negative for dizziness and headaches.      Objective:    BP 116/78   Pulse 79   Temp 97.6 F (36.4 C) (Temporal)   Ht 5\' 10"  (1.778 m)   Wt  265 lb (120.2 kg)   SpO2 97%   BMI 38.02 kg/m  Nursing note and vital signs reviewed.  Physical Exam Constitutional:      General: He is not in acute distress.    Appearance: He is well-developed.  Eyes:     Conjunctiva/sclera: Conjunctivae normal.  Cardiovascular:     Rate and Rhythm: Normal rate and regular rhythm.     Heart sounds: Normal heart sounds. No murmur heard.    No friction rub. No gallop.  Pulmonary:     Effort: Pulmonary effort is normal. No respiratory distress.     Breath sounds: Normal breath sounds. No wheezing or rales.  Chest:      Chest wall: No tenderness.  Abdominal:     General: Bowel sounds are normal.     Palpations: Abdomen is soft.     Tenderness: There is no abdominal tenderness.  Musculoskeletal:     Cervical back: Neck supple.  Lymphadenopathy:     Cervical: No cervical adenopathy.  Skin:    General: Skin is warm and dry.     Findings: No rash.  Neurological:     Mental Status: He is alert and oriented to person, place, and time.  Psychiatric:        Behavior: Behavior normal.        Thought Content: Thought content normal.        Judgment: Judgment normal.         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   Gout 08/31/2022   Hyperlipidemia 03/07/2022   Gonorrhea 10/07/2021   Chlamydia 10/07/2021   Vaccine counseling 04/05/2021   Hyperglycemia 09/01/2020   Asthma 03/04/2020   BMI 37.0-37.9, adult 10/09/2018   Caries 01/03/2017   Genital warts 10/28/2016   Gouty arthritis of right great toe 10/28/2016   Herpes simplex type 2 infection 07/28/2016   Lung nodule    HIV disease (HCC) 02/07/2014     Problem List Items Addressed This Visit       Other   Early syphilis, latent - Primary    Robert Stout has a newly positive RPR titer of 1:128 with no other symptoms and last RPR testing in August 2023 which is consistent with early latent syphilis. No neurological symptoms. Treated with 2.4 million units of Bicillin once. Instructed to abstain from sex for 1 week and will recheck RPR at next office visit.         I am having Robert Stout maintain his valACYclovir, imiquimod, allopurinol, loratadine, terbinafine, colchicine, Biktarvy, atorvastatin, albuterol, and Dulera. We administered  penicillin g benzathine and penicillin g benzathine.   Meds ordered this encounter  Medications   penicillin g benzathine (BICILLIN LA) 1200000 UNIT/2ML injection 1.2 Million Units    Order Specific Question:   Antibiotic Indication:    Answer:   Syphilis   penicillin g benzathine (BICILLIN LA) 1200000 UNIT/2ML injection 1.2 Million Units    Order Specific Question:   Antibiotic Indication:    Answer:   Syphilis  Follow-up: As scheduled with Dr. Daiva Eves.   Marcos Eke, MSN, FNP-C Nurse Practitioner [facility] Infectious Disease [facility] Main number: [phone]

## 2022-09-09 NOTE — Assessment & Plan Note (Signed)
Robert Stout has a newly positive RPR titer of 1:128 with no other symptoms and last RPR testing in August 2023 which is consistent with early latent syphilis. No neurological symptoms. Treated with 2.4 million units of Bicillin once. Instructed to abstain from sex for 1 week and will recheck RPR at next office visit.

## 2022-09-09 NOTE — Patient Instructions (Signed)
Nice to see you.  Plan for follow up with Dr. Daiva Eves as scheduled.   Have a great day and stay safe!

## 2022-10-11 ENCOUNTER — Other Ambulatory Visit: Payer: Self-pay

## 2022-10-11 ENCOUNTER — Ambulatory Visit: Payer: Commercial Managed Care - HMO | Admitting: Podiatry

## 2022-10-11 DIAGNOSIS — Z79899 Other long term (current) drug therapy: Secondary | ICD-10-CM

## 2022-10-11 DIAGNOSIS — B351 Tinea unguium: Secondary | ICD-10-CM | POA: Diagnosis not present

## 2022-10-11 DIAGNOSIS — M79675 Pain in left toe(s): Secondary | ICD-10-CM

## 2022-10-11 DIAGNOSIS — M79674 Pain in right toe(s): Secondary | ICD-10-CM | POA: Diagnosis not present

## 2022-10-11 MED ORDER — TERBINAFINE HCL 250 MG PO TABS
250.0000 mg | ORAL_TABLET | Freq: Every day | ORAL | 0 refills | Status: AC
  Filled 2022-10-11: qty 30, 30d supply, fill #0
  Filled 2022-12-24 – 2023-02-24 (×2): qty 30, 30d supply, fill #1
  Filled 2023-08-24: qty 30, 30d supply, fill #2

## 2022-10-11 NOTE — Patient Instructions (Signed)
Terbinafine Tablets What is this medication? TERBINAFINE (TER bin a feen) treats fungal infections of the nails. It belongs to a group of medications called antifungals. It will not treat infections caused by bacteria or viruses. This medicine may be used for other purposes; ask your health care provider or pharmacist if you have questions. COMMON BRAND NAME(S): Lamisil, Terbinex What should I tell my care team before I take this medication? They need to know if you have any of these conditions: Liver disease An unusual or allergic reaction to terbinafine, other medications, foods, dyes, or preservatives Pregnant or trying to get pregnant Breast-feeding How should I use this medication? Take this medication by mouth with water. Take it as directed on the prescription label at the same time every day. You can take it with or without food. If it upsets your stomach, take it with food. Keep taking it unless your care team tells you to stop. A special MedGuide will be given to you by the pharmacist with each prescription and refill. Be sure to read this information carefully each time. Talk to your care team regarding the use of this medication in children. Special care may be needed. Overdosage: If you think you have taken too much of this medicine contact a poison control center or emergency room at once. NOTE: This medicine is only for you. Do not share this medicine with others. What if I miss a dose? If you miss a dose, take it as soon as you can unless it is more than 4 hours late. If it is more than 4 hours late, skip the missed dose. Take the next dose at the normal time. What may interact with this medication? Do not take this medication with any of the following: Pimozide Thioridazine This medication may also interact with the following: Beta blockers Caffeine Certain medications for mental health conditions Cimetidine Cyclosporine Medications for fungal infections like fluconazole  and ketoconazole Medications for irregular heartbeat like amiodarone, flecainide and propafenone Rifampin Warfarin This list may not describe all possible interactions. Give your health care provider a list of all the medicines, herbs, non-prescription drugs, or dietary supplements you use. Also tell them if you smoke, drink alcohol, or use illegal drugs. Some items may interact with your medicine. What should I watch for while using this medication? Visit your care team for regular checks on your progress. You may need blood work while you are taking this medication. It may be some time before you see the benefit from this medication. This medication may cause serious skin reactions. They can happen weeks to months after starting the medication. Contact your care team right away if you notice fevers or flu-like symptoms with a rash. The rash may be red or purple and then turn into blisters or peeling of the skin. Or, you might notice a red rash with swelling of the face, lips or lymph nodes in your neck or under your arms. This medication can make you more sensitive to the sun. Keep out of the sun, If you cannot avoid being in the sun, wear protective clothing and sunscreen. Do not use sun lamps or tanning beds/booths. What side effects may I notice from receiving this medication? Side effects that you should report to your care team as soon as possible: Allergic reactions--skin rash, itching, hives, swelling of the face, lips, tongue, or throat Change in sense of smell Change in taste Infection--fever, chills, cough, or sore throat Liver injury--right upper belly pain, loss of appetite, nausea,   light-colored stool, dark yellow or brown urine, yellowing skin or eyes, unusual weakness or fatigue Low red blood cell level--unusual weakness or fatigue, dizziness, headache, trouble breathing Lupus-like syndrome--joint pain, swelling, or stiffness, butterfly-shaped rash on the face, rashes that get worse  in the sun, fever, unusual weakness or fatigue Rash, fever, and swollen lymph nodes Redness, blistering, peeling, or loosening of the skin, including inside the mouth Unusual bruising or bleeding Worsening mood, feelings of depression Side effects that usually do not require medical attention (report to your care team if they continue or are bothersome): Diarrhea Gas Headache Nausea Stomach pain Upset stomach This list may not describe all possible side effects. Call your doctor for medical advice about side effects. You may report side effects to FDA at 1-800-FDA-1088. Where should I keep my medication? Keep out of the reach of children and pets. Store between 20 and 25 degrees C (68 and 77 degrees F). Protect from light. Get rid of any unused medication after the expiration date. To get rid of medications that are no longer needed or have expired: Take the medication to a medication take-back program. Check with your pharmacy or law enforcement to find a location. If you cannot return the medication, check the label or package insert to see if the medication should be thrown out in the garbage or flushed down the toilet. If you are not sure, ask your care team. If it is safe to put it in the trash, take the medication out of the container. Mix the medication with cat litter, dirt, coffee grounds, or other unwanted substance. Seal the mixture in a bag or container. Put it in the trash. NOTE: This sheet is a summary. It may not cover all possible information. If you have questions about this medicine, talk to your doctor, pharmacist, or health care provider.  2024 Elsevier/Gold Standard (2020-09-24 00:00:00)  

## 2022-10-13 NOTE — Progress Notes (Signed)
Subjective: Chief Complaint  Patient presents with   Nail Problem    Nail trim and refill on medication      47 year old male presents the office today with the above concerns.  States he is doing well the Lamisil and side effects.  His nails are quite thick and elongated he cannot trim himself.  No swelling redness or drainage but translates.  No conditions.  No other concerns.   Objective: AAO x3, NAD DP/PT pulses palpable bilaterally, CRT less than 3 seconds The toenails are hypertrophic, dystrophic with brown discoloration and quite long. There is some mild clearing along the proximal nail folds.  There is no edema, erythema or signs of infection of the toenail sites there are elongated. No pain with calf compression, swelling, warmth, erythema  Assessment: 47 year old male with symptomatic onychomycosis  Plan: -All treatment options discussed with the patient including all alternatives, risks, complications.  -Sharply debride the nails x 10 without any complications or bleeding.  Recheck CBC, LFT and likely restart medication for Lamisil. -Patient encouraged to call the office with any questions, concerns, change in symptoms.   Vivi Barrack DPM

## 2022-10-18 ENCOUNTER — Other Ambulatory Visit: Payer: Self-pay

## 2022-12-24 ENCOUNTER — Other Ambulatory Visit: Payer: Self-pay

## 2022-12-31 ENCOUNTER — Ambulatory Visit: Payer: Commercial Managed Care - HMO | Admitting: Nurse Practitioner

## 2023-01-05 ENCOUNTER — Other Ambulatory Visit: Payer: Self-pay

## 2023-01-13 ENCOUNTER — Ambulatory Visit: Payer: Commercial Managed Care - HMO | Admitting: Podiatry

## 2023-02-24 ENCOUNTER — Other Ambulatory Visit (HOSPITAL_COMMUNITY): Payer: Self-pay

## 2023-02-24 ENCOUNTER — Other Ambulatory Visit: Payer: Self-pay

## 2023-02-25 ENCOUNTER — Other Ambulatory Visit: Payer: Self-pay

## 2023-03-06 NOTE — Progress Notes (Signed)
 Subjective:  Chief complaint: follow-up for HIV disease medications   Patient ID: Robert Stout, male    DOB: 05-08-1975, 48 y.o.   MRN: [id number]  HPI   Discussed the use of AI scribe software for clinical note transcription with the patient, who gave verbal consent to proceed.  History of Present Illness   The patient, with a history of syphilis, gout, asthma, and HIV, presents for a follow-up visit. They were previously treated for early latent  syphilis with penicillin  injection.  The patient has a history of gout but reports no recent flare-ups. They take colchicine  as needed for gout symptoms.  For asthma, the patient recently started using Dulera , a combination steroid and beta agonist inhaler. They report no current asthma symptoms.  The patient is also managing HIV with Biktarvy , which they report taking regularly. They also take atorvastatin  for cholesterol management.  The patient has been experiencing issues with their insurance coverage, particularly for their asthma medication, Dulera . They report having to pay out-of-pocket for this medication.           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   HIV infection (HCC) Dx 2011   Hyperglycemia 09/01/2020   Hyperlipidemia 03/07/2022   Rectal bleeding 10/09/2018   Vaccine counseling 04/05/2021   Weight gain 10/09/2018    Past Surgical History:  Procedure Laterality Date   TENDON REPAIR      Family History  Problem Relation Age of Onset   Diabetes Mother    Hypertension Mother       Social History   Socioeconomic History   Marital status: Single    Spouse name: Not on file   Number of children: Not on file   Years of education: Not on file   Highest education level: Not on file  Occupational History   Not on file   Tobacco Use   Smoking status: Never    Passive exposure: Never   Smokeless tobacco: Never  Vaping Use   Vaping status: Never Used  Substance and Sexual Activity   Alcohol use: No    Alcohol/week: 0.0 standard drinks of alcohol   Drug use: No   Sexual activity: Not Currently    Comment: declined condoms  Other Topics Concern   Not on file  Social History Narrative   Not on file   Social Drivers of Health   Financial Resource Strain: Not on file  Food Insecurity: Not on file  Transportation Needs: Not on file  Physical Activity: Not on file  Stress: Not on file  Social Connections: Unknown (12/23/2022)   Received from [employer]   Social Network    Social Network: Not on file    No Known Allergies   Current Outpatient Medications:    albuterol  (VENTOLIN  HFA) 108 (90 Base) MCG/ACT inhaler, Inhale 2 puffs into the lungs every 6 (six) hours as needed for wheezing or shortness of breath., Disp: 18 g, Rfl: 6   allopurinol  (ZYLOPRIM ) 100 MG tablet, Take 1 tablet (100 mg total) by mouth daily., Disp: 90 tablet, Rfl: 2   atorvastatin  (LIPITOR) 20 MG tablet, Take 1 tablet (20 mg total) by mouth daily., Disp: 30 tablet, Rfl: 11   bictegravir-emtricitabine -tenofovir  AF (BIKTARVY ) 50-200-25 MG TABS tablet, Take 1 tablet by mouth daily., Disp: 30 tablet, Rfl: 11  colchicine  0.6 MG tablet, Take 1 tablet (0.6 mg total) by mouth daily., Disp: 30 tablet, Rfl: 11   imiquimod  (ALDARA ) 5 % cream, APPLY CONTENTS OF 1 PACKET TOPICALLY TO THE AFFECTED AREA THREE TIMES PER WEEK, Disp: 12 each, Rfl: 3   loratadine  (CLARITIN ) 10 MG tablet, Take 1 tablet (10 mg total) by mouth daily., Disp: 30 tablet, Rfl: 11   mometasone -formoterol  (DULERA ) 100-5 MCG/ACT AERO, Inhale 2 puffs into the lungs 2 (two) times daily., Disp: 13 g, Rfl: 11   terbinafine  (LAMISIL ) 250 MG tablet, Take 1 tablet (250 mg total) by mouth daily., Disp: 90 tablet, Rfl: 0   valACYclovir  (VALTREX ) 500 MG tablet, Take 1 tablet (500  mg total) by mouth 2 (two) times daily. 1 TABLET DAILY, Disp: 14 tablet, Rfl: 0   Review of Systems  Constitutional:  Negative for activity change, appetite change, chills, diaphoresis, fatigue, fever and unexpected weight change.  HENT:  Negative for congestion, rhinorrhea, sinus pressure, sneezing, sore throat and trouble swallowing.   Eyes:  Negative for photophobia and visual disturbance.  Respiratory:  Negative for cough, chest tightness, shortness of breath, wheezing and stridor.   Cardiovascular:  Negative for chest pain, palpitations and leg swelling.  Gastrointestinal:  Negative for abdominal distention, abdominal pain, anal bleeding, blood in stool, constipation, diarrhea, nausea and vomiting.  Genitourinary:  Negative for difficulty urinating, dysuria, flank pain and hematuria.  Musculoskeletal:  Negative for arthralgias, back pain, gait problem, joint swelling and myalgias.  Skin:  Negative for color change, pallor, rash and wound.  Neurological:  Negative for dizziness, tremors, weakness and light-headedness.  Hematological:  Negative for adenopathy. Does not bruise/bleed easily.  Psychiatric/Behavioral:  Negative for agitation, behavioral problems, confusion, decreased concentration, dysphoric mood and sleep disturbance.        Objective:   Physical Exam Constitutional:      Appearance: He is well-developed.  HENT:     Head: Normocephalic and atraumatic.  Eyes:     Conjunctiva/sclera: Conjunctivae normal.  Cardiovascular:     Rate and Rhythm: Normal rate and regular rhythm.  Pulmonary:     Effort: Pulmonary effort is normal. No respiratory distress.     Breath sounds: No wheezing.  Abdominal:     General: There is no distension.     Palpations: Abdomen is soft.  Musculoskeletal:        General: No tenderness. Normal range of motion.     Cervical back: Normal range of motion and neck supple.  Skin:    General: Skin is warm and dry.     Coloration: Skin is not  pale.     Findings: No erythema or rash.  Neurological:     General: No focal deficit present.     Mental Status: He is alert and oriented to person, place, and time.  Psychiatric:        Mood and Affect: Mood normal.        Behavior: Behavior normal.        Thought Content: Thought content normal.        Judgment: Judgment normal.           Assessment & Plan:   Assessment and Plan    HIV Patient on Biktarvy , filled at [facility]. -Continue Biktarvy  as prescribed. --check HIV quant RNA, CD4 and routine labs     Syphilis Treated with penicillin  injections and oral medication. Last negative test was in August 2023, positive test in July 2024. -No further action required at this time.  Gout No recent flares. Patient takes colchicine  as needed. -Continue current management.  Asthma Patient recently started on Dulera  (combination steroid and long-acting beta agonist). -Continue Dulera  as prescribed --albuterol  PRN  Hyperlipidemia Patient on atorvastatin . -Continue atorvastatin  as prescribed -check lipid panel today   Vaccinations Patient due for annual flu and COVID-19 vaccines. Upcoming 48th birthday, eligible for Shingrix vaccine. -Administer flu and COVID-19 vaccines today. -Plan to administer Shingrix vaccine at next visit in July.  Follow-up Plan to see patient in July, labs to be done at the same visit.

## 2023-03-07 ENCOUNTER — Other Ambulatory Visit: Payer: Self-pay

## 2023-03-07 ENCOUNTER — Ambulatory Visit (INDEPENDENT_AMBULATORY_CARE_PROVIDER_SITE_OTHER): Payer: Commercial Managed Care - HMO | Admitting: Infectious Disease

## 2023-03-07 ENCOUNTER — Encounter: Payer: Self-pay | Admitting: Infectious Disease

## 2023-03-07 ENCOUNTER — Other Ambulatory Visit (HOSPITAL_COMMUNITY): Payer: Self-pay

## 2023-03-07 VITALS — BP 129/87 | HR 73 | Temp 97.9°F | Ht 70.0 in | Wt 278.0 lb

## 2023-03-07 DIAGNOSIS — Z23 Encounter for immunization: Secondary | ICD-10-CM

## 2023-03-07 DIAGNOSIS — B20 Human immunodeficiency virus [HIV] disease: Secondary | ICD-10-CM | POA: Diagnosis not present

## 2023-03-07 DIAGNOSIS — E785 Hyperlipidemia, unspecified: Secondary | ICD-10-CM | POA: Diagnosis not present

## 2023-03-07 DIAGNOSIS — J45909 Unspecified asthma, uncomplicated: Secondary | ICD-10-CM | POA: Diagnosis not present

## 2023-03-07 DIAGNOSIS — Z7185 Encounter for immunization safety counseling: Secondary | ICD-10-CM

## 2023-03-07 DIAGNOSIS — Z6837 Body mass index (BMI) 37.0-37.9, adult: Secondary | ICD-10-CM

## 2023-03-07 DIAGNOSIS — A515 Early syphilis, latent: Secondary | ICD-10-CM

## 2023-03-07 MED ORDER — ATORVASTATIN CALCIUM 20 MG PO TABS: 20.0000 mg | ORAL_TABLET | Freq: Every day | ORAL | 11 refills | Status: DC

## 2023-03-07 MED ORDER — BIKTARVY 50-200-25 MG PO TABS: 1.0000 | ORAL_TABLET | Freq: Every day | ORAL | 11 refills | Status: DC

## 2023-03-12 LAB — CBC WITH DIFFERENTIAL/PLATELET
Absolute Lymphocytes: 2396 {cells}/uL (ref 850–3900)
Absolute Monocytes: 441 {cells}/uL (ref 200–950)
Basophils Absolute: 49 {cells}/uL (ref 0–200)
Basophils Relative: 1 %
Eosinophils Absolute: 162 {cells}/uL (ref 15–500)
Eosinophils Relative: 3.3 %
HCT: 43.4 % (ref 38.5–50.0)
Hemoglobin: 14.8 g/dL (ref 13.2–17.1)
MCH: 31.4 pg (ref 27.0–33.0)
MCHC: 34.1 g/dL (ref 32.0–36.0)
MCV: 92.1 fL (ref 80.0–100.0)
MPV: 9.9 fL (ref 7.5–12.5)
Monocytes Relative: 9 %
Neutro Abs: 1852 {cells}/uL (ref 1500–7800)
Neutrophils Relative %: 37.8 %
Platelets: 299 10*3/uL (ref 140–400)
RBC: 4.71 10*6/uL (ref 4.20–5.80)
RDW: 12.7 % (ref 11.0–15.0)
Total Lymphocyte: 48.9 %
WBC: 4.9 10*3/uL (ref 3.8–10.8)

## 2023-03-12 LAB — COMPLETE METABOLIC PANEL WITH GFR
AG Ratio: 1.4 (calc) (ref 1.0–2.5)
ALT: 19 U/L (ref 9–46)
AST: 18 U/L (ref 10–40)
Albumin: 4.2 g/dL (ref 3.6–5.1)
Alkaline phosphatase (APISO): 68 U/L (ref 36–130)
BUN: 10 mg/dL (ref 7–25)
CO2: 25 mmol/L (ref 20–32)
Calcium: 9.4 mg/dL (ref 8.6–10.3)
Chloride: 102 mmol/L (ref 98–110)
Creat: 1.29 mg/dL (ref 0.60–1.29)
Globulin: 3 g/dL (ref 1.9–3.7)
Glucose, Bld: 124 mg/dL — ABNORMAL HIGH (ref 65–99)
Potassium: 4.3 mmol/L (ref 3.5–5.3)
Sodium: 136 mmol/L (ref 135–146)
Total Bilirubin: 0.4 mg/dL (ref 0.2–1.2)
Total Protein: 7.2 g/dL (ref 6.1–8.1)
eGFR: 68 mL/min/{1.73_m2} (ref >=60)

## 2023-03-12 LAB — HIV-1 RNA QUANT-NO REFLEX-BLD
HIV 1 RNA Quant: NOT DETECTED {copies}/mL
HIV-1 RNA Quant, Log: NOT DETECTED {Log}

## 2023-03-12 LAB — T-HELPER CELLS (CD4) COUNT (NOT AT ARMC)
Absolute CD4: 957 {cells}/uL (ref 490–1740)
CD4 T Helper %: 40 % (ref 30–61)
Total lymphocyte count: 2391 {cells}/uL (ref 850–3900)

## 2023-03-12 LAB — RPR TITER: RPR Titer: 1:16 {titer} — ABNORMAL HIGH

## 2023-03-12 LAB — T PALLIDUM AB: T Pallidum Abs: POSITIVE — AB

## 2023-03-12 LAB — LIPID PANEL
Cholesterol: 180 mg/dL (ref <200)
HDL: 36 mg/dL — ABNORMAL LOW (ref >=40)
LDL Cholesterol (Calc): 116 mg/dL — ABNORMAL HIGH
Non-HDL Cholesterol (Calc): 144 mg/dL — ABNORMAL HIGH (ref <130)
Total CHOL/HDL Ratio: 5 (calc) — ABNORMAL HIGH (ref <5.0)
Triglycerides: 167 mg/dL — ABNORMAL HIGH (ref <150)

## 2023-03-12 LAB — RPR: RPR Ser Ql: REACTIVE — AB

## 2023-03-16 ENCOUNTER — Other Ambulatory Visit: Payer: Self-pay

## 2023-03-16 MED ORDER — PREVIDENT 5000 BOOSTER PLUS 1.1 % DT PSTE
PASTE | DENTAL | 10 refills | Status: AC | PRN
  Filled 2023-03-16: qty 100, 30d supply, fill #0
  Filled 2023-08-24: qty 100, 30d supply, fill #1
  Filled 2024-03-09: qty 100, 30d supply, fill #2

## 2023-03-25 ENCOUNTER — Other Ambulatory Visit: Payer: Self-pay

## 2023-04-28 ENCOUNTER — Ambulatory Visit (INDEPENDENT_AMBULATORY_CARE_PROVIDER_SITE_OTHER): Payer: Medicaid Other | Admitting: Podiatry

## 2023-04-28 ENCOUNTER — Encounter: Payer: Self-pay | Admitting: Podiatry

## 2023-04-28 VITALS — Ht 70.0 in | Wt 278.0 lb

## 2023-04-28 DIAGNOSIS — B351 Tinea unguium: Secondary | ICD-10-CM | POA: Diagnosis not present

## 2023-04-28 DIAGNOSIS — M79675 Pain in left toe(s): Secondary | ICD-10-CM | POA: Diagnosis not present

## 2023-04-28 DIAGNOSIS — M79674 Pain in right toe(s): Secondary | ICD-10-CM | POA: Diagnosis not present

## 2023-04-28 NOTE — Progress Notes (Signed)
This patient returns to the office for evaluation and treatment of long thick painful nails .  This patient is unable to trim his own nails since the patient cannot reach his feet.  Patient says the nails are painful walking and wearing his shoes.  He returns for preventive foot care services.  General Appearance  Alert, conversant and in no acute stress.  Vascular  Dorsalis pedis and posterior tibial  pulses are palpable  bilaterally.  Capillary return is within normal limits  bilaterally. Temperature is within normal limits  bilaterally.  Neurologic  Senn-Weinstein monofilament wire test within normal limits  bilaterally. Muscle power within normal limits bilaterally.  Nails Thick disfigured discolored nails with subungual debris  from hallux to fifth toes bilaterally. No evidence of bacterial infection or drainage bilaterally.  Orthopedic  No limitations of motion  feet .  No crepitus or effusions noted.  No bony pathology or digital deformities noted.  Skin  normotropic skin with no porokeratosis noted bilaterally.  No signs of infections or ulcers noted.     Onychomycosis  Pain in toes right foot  Pain in toes left foot  Debridement  of nails  1-5  B/L with a nail nipper.  Nails were then filed using a dremel tool with no incidents.    RTC  3 months    Norita Meigs DPM  

## 2023-07-15 NOTE — Progress Notes (Signed)
 The 10-year ASCVD risk score (Arnett DK, et al., 2019) is: 5.8%   Values used to calculate the score:     Age: 48 years     Sex: Male     Is Non-Hispanic African American: Yes     Diabetic: No     Tobacco smoker: No     Systolic Blood Pressure: 129 mmHg     Is BP treated: No     HDL Cholesterol: 36 mg/dL     Total Cholesterol: 180 mg/dL  Currently prescribed atorvastatin  20 mg.  Jennifr Gaeta, BSN, RN

## 2023-07-25 ENCOUNTER — Other Ambulatory Visit: Payer: Self-pay

## 2023-07-25 ENCOUNTER — Encounter: Payer: Self-pay | Admitting: Podiatry

## 2023-07-25 ENCOUNTER — Ambulatory Visit (INDEPENDENT_AMBULATORY_CARE_PROVIDER_SITE_OTHER): Admitting: Podiatry

## 2023-07-25 ENCOUNTER — Ambulatory Visit (INDEPENDENT_AMBULATORY_CARE_PROVIDER_SITE_OTHER)

## 2023-07-25 VITALS — Ht 70.0 in | Wt 278.0 lb

## 2023-07-25 DIAGNOSIS — Z79899 Other long term (current) drug therapy: Secondary | ICD-10-CM

## 2023-07-25 DIAGNOSIS — R52 Pain, unspecified: Secondary | ICD-10-CM

## 2023-07-25 DIAGNOSIS — M79675 Pain in left toe(s): Secondary | ICD-10-CM | POA: Diagnosis not present

## 2023-07-25 DIAGNOSIS — M7751 Other enthesopathy of right foot: Secondary | ICD-10-CM

## 2023-07-25 DIAGNOSIS — B351 Tinea unguium: Secondary | ICD-10-CM

## 2023-07-25 DIAGNOSIS — M79674 Pain in right toe(s): Secondary | ICD-10-CM | POA: Diagnosis not present

## 2023-07-25 MED ORDER — METHYLPREDNISOLONE 4 MG PO TBPK
ORAL_TABLET | ORAL | 0 refills | Status: AC
  Filled 2023-07-25: qty 21, 6d supply, fill #0

## 2023-07-25 NOTE — Progress Notes (Unsigned)
 Subjective: Chief Complaint  Patient presents with   Nail Problem    Patient is here for RFC and right hallux total nail removal   48 year old male presents the office today for concerns of thick, elongated nails that he is not able to trim himself.    He also has a new concern of pain.  He also has concerns of pain to his right foot reports mild the first MTPJ, IPJ where he gets pain and not particularly to the toenail itself.  He has a history of gout.  Denies any recent treatment for gout and he does not recall any recent injuries.  No open lesions.  Objective: AAO x3, NAD DP/PT pulses palpable bilaterally, CRT less than 3 seconds Nails are hypertrophic, dystrophic, brittle, discolored, elongated 10. No surrounding redness or drainage. Tenderness nails 1-5 bilaterally. No open lesions or pre-ulcerative lesions are identified today. Tenderness is mostly on the right hallux as well as IPJ there is localized edema and slight warmth but there is no area pinpoint tenderness noted otherwise.  There is decreased range of motion at the level of the MTPJ.  Flexor, extensor tendons appear to be intact. No pain with calf compression, swelling, warmth, erythema  Assessment: Symptomatic onychomycosis; capsulitis, gout  Plan:  New complaint of pain -Likely due to gout.  Right first MTPJ pain, gout - X-rays obtained reviewed.  Arthritic changes present the first MTPJ.  There is no evidence of acute fracture identified otherwise. - We discussed steroid injection he wishes to proceed.  Cleaned the skin with Betadine, alcohol.  Mixture 1 cc Kenalog 10, 0.5 cc of Marcaine plain, 0.5 cc of lidocaine  plain was infiltrated into around the first MTPJ without complications.  Postinjection care discussed.  Tolerated well. - Medrol  dose pack  Symptomatic onychomycosis - Sharply debrided nails x 10 without any complications or bleeding.  Originally thought he needed to hallux toenail removed on the right  side but the pain is not localized to the nail itself mostly it is mostly more along the joint which I think is more consistent with gout, capsulitis. - We discussed getting back on Lamisil .  Offered to order blood work today to check liver function, CBC but he is going to follow-up with his PCP next month to have blood work done.  At that time if blood work is normal we will order Lamisil .   Charity Conch DPM

## 2023-07-25 NOTE — Patient Instructions (Addendum)
 While at your visit today you received a steroid injection in your foot or ankle to help with your pain. Along with having the steroid medication there is some "numbing" medication in the shot that you received. Due to this you may notice some numbness to the area for the next couple of hours.   I would recommend limiting activity for the next few days to help the steroid injection take affect.    The actually benefit from the steroid injection may take up to 2-7 days to see a difference. You may actually experience a small (as in 10%) INCREASE in pain in the first 24 hours---that is common. It would be best if you can ice the area today and take anti-inflammatory medications (such as Ibuprofen, Motrin, or Aleve) if you are able to take these medications. If you were prescribed another medication to help with the pain go ahead and start that medication today    Things to watch out for that you should contact us or a health care provider urgently would include: 1. Unusual (as in more than 10%) increase in pain 2. New fever > 101.5 3. New swelling or redness of the injected area.  4. Streaking of red lines around the area injected.  If you have any questions or concerns about this, please give our office a call at [phone].    --  Gout  Gout is painful swelling of your joints. Gout is a type of arthritis. It is caused by having too much uric acid in your body. Uric acid is a chemical that is made when your body breaks down substances called purines. If your body has too much uric acid, sharp crystals can form and build up in your joints. This causes pain and swelling. Gout attacks can happen quickly and be very painful (acute gout). Over time, the attacks can affect more joints and happen more often (chronic gout). What are the causes? Gout is caused by too much uric acid in your blood. This can happen because: Your kidneys do not remove enough uric acid from your blood. Your body makes too  much uric acid. You eat too many foods that are high in purines. These foods include organ meats, some seafood, and beer. Trauma or stress can bring on an attack. What increases the risk? Having a family history of gout. Being male and middle-aged. Being male and having gone through menopause. Having an organ transplant. Taking certain medicines. Having certain conditions, such as: Being very overweight (obese). Lead poisoning. Kidney disease. A skin condition called psoriasis. Other risks include: Losing weight too quickly. Not having enough water in the body (being dehydrated). Drinking alcohol, especially beer. Drinking beverages that are sweetened with a type of sugar called fructose. What are the signs or symptoms? An attack of acute gout often starts at night and usually happens in just one joint. The most common place is the big toe. Other joints that may be affected include joints of the feet, ankle, knee, fingers, wrist, or elbow. Symptoms may include: Very bad pain. Warmth. Swelling. Stiffness. Tenderness. The affected joint may be very painful to touch. Shiny, red, or purple skin. Chills and fever. Chronic gout may cause symptoms more often. More joints may be involved. You may also have white or yellow lumps (tophi) on your hands or feet or in other areas near your joints. How is this treated? Treatment for an acute attack may include medicines for pain and swelling, such as: NSAIDs, such as ibuprofen.  Steroids taken by mouth or injected into a joint. Colchicine. This can be given by mouth or through an IV tube. Treatment to prevent future attacks may include: Taking small doses of NSAIDs or colchicine daily. Using a medicine that reduces uric acid levels in your blood, such as allopurinol. Making changes to your diet. You may need to see a food expert (dietitian) about what to eat and drink to prevent gout. Follow these instructions at home: During a gout  attack  If told, put ice on the painful area. To do this: Put ice in a plastic bag. Place a towel between your skin and the bag. Leave the ice on for 20 minutes, 2-3 times a day. Take off the ice if your skin turns bright red. This is very important. If you cannot feel pain, heat, or cold, you have a greater risk of damage to the area. Raise the painful joint above the level of your heart as often as you can. Rest the joint as much as possible. If the joint is in your leg, you may be given crutches. Follow instructions from your doctor about what you cannot eat or drink. Avoiding future gout attacks Eat a low-purine diet. Avoid foods and drinks such as: Liver. Kidney. Anchovies. Asparagus. Herring. Mushrooms. Mussels. Beer. Stay at a healthy weight. If you want to lose weight, talk with your doctor. Do not lose weight too fast. Start or continue an exercise plan as told by your doctor. Eating and drinking Avoid drinks sweetened by fructose. Drink enough fluids to keep your pee (urine) pale yellow. If you drink alcohol: Limit how much you have to: 0-1 drink a day for women who are not pregnant. 0-2 drinks a day for men. Know how much alcohol is in a drink. In the [place] one drink equals one 12 oz bottle of beer (355 mL), one 5 oz glass of wine (148 mL), or one 1 oz glass of hard liquor (44 mL). General instructions Take over-the-counter and prescription medicines only as told by your doctor. Ask your doctor if you should avoid driving or using machines while you are taking your medicine. Return to your normal activities when your doctor says that it is safe. Keep all follow-up visits. Where to find more information [employer]: www.niams.http://www.myers.net/ Contact a doctor if: You have another gout attack. You still have symptoms of a gout attack after 10 days of treatment. You have problems (side effects) because of your medicines. You have chills or a fever. You have  burning pain when you pee (urinate). You have pain in your lower back or belly. Get help right away if: You have very bad pain. Your pain cannot be controlled. You cannot pee. Summary Gout is painful swelling of the joints. The most common site of pain is the big toe, but it can affect other joints. Medicines and avoiding some foods can help to prevent and treat gout attacks. This information is not intended to replace advice given to you by your health care provider. Make sure you discuss any questions you have with your health care provider. Document Revised: 11/12/2020 Document Reviewed: 11/12/2020 Elsevier Patient Education  2024 [employer].

## 2023-08-09 ENCOUNTER — Ambulatory Visit

## 2023-08-09 ENCOUNTER — Other Ambulatory Visit: Payer: Self-pay

## 2023-08-24 ENCOUNTER — Other Ambulatory Visit: Payer: Self-pay

## 2023-08-24 ENCOUNTER — Other Ambulatory Visit: Payer: Self-pay | Admitting: Podiatry

## 2023-08-31 ENCOUNTER — Other Ambulatory Visit: Payer: Self-pay

## 2023-09-04 NOTE — Progress Notes (Unsigned)
 Chief complaint: follow-up for HIV disease on medications  Subjective:    Patient ID: Robert Stout, male    DOB: [birth year], 48 y.o.   MRN: [id number]  HPI  Discussed the use of AI scribe software for clinical note transcription with the patient, who gave verbal consent to proceed.  History of Present Illness   Robert Stout is a 48 year old male with asthma and HIV who presents with medication access issues.  He is experiencing difficulties accessing his HIV medication, Biktarvy , due to a lapse in coverage. This turns out to have been in part because [facility] did not have his medicaid number and it took coaching from [place] to get them to do their job and fill his meds. 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   HIV infection (HCC) Dx 2011   Hyperglycemia 09/01/2020   Hyperlipidemia 03/07/2022   Rectal bleeding 10/09/2018   Vaccine counseling 04/05/2021   Weight gain 10/09/2018    Past Surgical History:  Procedure Laterality Date   TENDON REPAIR      Family History  Problem Relation Age of Onset   Diabetes Mother    Hypertension Mother       Social History   Socioeconomic History   Marital status: Single    Spouse name: Not on file   Number of children: Not on file   Years of education: Not on file   Highest education level: Not on file  Occupational History   Not on file  Tobacco Use   Smoking status: Never    Passive exposure: Never   Smokeless tobacco: Never  Vaping Use   Vaping status: Never Used  Substance and Sexual Activity   Alcohol use: No    Alcohol/week: 0.0 standard drinks of alcohol   Drug use: No   Sexual activity: Not Currently    Comment: declined condoms  Other Topics Concern   Not on file  Social History Narrative    Not on file   Social Drivers of Health   Financial Resource Strain: Not on file  Food Insecurity: Not on file  Transportation Needs: Not on file  Physical Activity: Not on file  Stress: Not on file  Social Connections: Unknown (12/23/2022)   Received from [employer]   Social Network    Social Network: Not on file    No Known Allergies   Current Outpatient Medications:    albuterol  (VENTOLIN  HFA) 108 (90 Base) MCG/ACT inhaler, Inhale 2 puffs into the lungs every 6 (six) hours as needed for wheezing or shortness of breath., Disp: 18 g, Rfl: 6   allopurinol  (ZYLOPRIM ) 100 MG tablet, Take 1 tablet (100 mg total) by mouth daily., Disp: 90 tablet, Rfl: 2   atorvastatin  (LIPITOR) 20 MG tablet, Take 1 tablet (20 mg total) by mouth daily., Disp: 30 tablet, Rfl: 11   bictegravir-emtricitabine -tenofovir  AF (BIKTARVY ) 50-200-25 MG TABS tablet, Take 1 tablet by mouth daily., Disp: 30 tablet, Rfl: 11   colchicine  0.6 MG tablet, Take 1 tablet (0.6 mg total) by mouth daily., Disp: 30 tablet, Rfl: 11   imiquimod  (ALDARA ) 5 % cream, APPLY CONTENTS OF 1 PACKET TOPICALLY TO THE AFFECTED AREA THREE TIMES PER WEEK, Disp: 12 each, Rfl: 3   loratadine  (  CLARITIN ) 10 MG tablet, Take 1 tablet (10 mg total) by mouth daily., Disp: 30 tablet, Rfl: 11   methylPREDNISolone  (MEDROL  DOSEPAK) 4 MG TBPK tablet, Take as directed on dose pack, Disp: 21 tablet, Rfl: 0   mometasone -formoterol  (DULERA ) 100-5 MCG/ACT AERO, Inhale 2 puffs into the lungs 2 (two) times daily., Disp: 13 g, Rfl: 11   Sodium Fluoride  (PREVIDENT  5000 BOOSTER PLUS) 1.1 % PSTE, BRUSH ON TEETH IN THE EVENING FOR MOUTH CARE. SPIT OUT EXCESS. DO NOT RINSE., Disp: 100 mL, Rfl: 10   terbinafine  (LAMISIL ) 250 MG tablet, Take 1 tablet (250 mg total) by mouth daily., Disp: 90 tablet, Rfl: 0   valACYclovir  (VALTREX ) 500 MG tablet, Take 1 tablet (500 mg total) by mouth 2 (two) times daily. 1 TABLET DAILY, Disp: 14 tablet, Rfl: 0   Review of Systems   Constitutional:  Negative for activity change, appetite change, chills, diaphoresis, fatigue, fever and unexpected weight change.  HENT:  Negative for congestion, rhinorrhea, sinus pressure, sneezing, sore throat and trouble swallowing.   Eyes:  Negative for photophobia and visual disturbance.  Respiratory:  Negative for cough, chest tightness, shortness of breath, wheezing and stridor.   Cardiovascular:  Negative for chest pain, palpitations and leg swelling.  Gastrointestinal:  Negative for abdominal distention, abdominal pain, anal bleeding, blood in stool, constipation, diarrhea, nausea and vomiting.  Genitourinary:  Negative for difficulty urinating, dysuria, flank pain and hematuria.  Musculoskeletal:  Negative for arthralgias, back pain, gait problem, joint swelling and myalgias.  Skin:  Negative for color change, pallor, rash and wound.  Neurological:  Negative for dizziness, tremors, weakness and light-headedness.  Hematological:  Negative for adenopathy. Does not bruise/bleed easily.  Psychiatric/Behavioral:  Negative for agitation, behavioral problems, confusion, decreased concentration, dysphoric mood and sleep disturbance.        Objective:   Physical Exam Constitutional:      Appearance: He is well-developed.  HENT:     Head: Normocephalic and atraumatic.  Eyes:     Conjunctiva/sclera: Conjunctivae normal.  Cardiovascular:     Rate and Rhythm: Normal rate and regular rhythm.  Pulmonary:     Effort: Pulmonary effort is normal. No respiratory distress.     Breath sounds: No wheezing.  Abdominal:     General: There is no distension.     Palpations: Abdomen is soft.  Musculoskeletal:        General: No tenderness. Normal range of motion.     Cervical back: Normal range of motion and neck supple.  Skin:    General: Skin is warm and dry.     Coloration: Skin is not pale.     Findings: No erythema or rash.  Neurological:     General: No focal deficit present.      Mental Status: He is alert and oriented to person, place, and time.  Psychiatric:        Mood and Affect: Mood normal.        Behavior: Behavior normal.        Thought Content: Thought content normal.        Judgment: Judgment normal.           Assessment & Plan:   Assessment and Plan    HIV On Biktarvy . Deanna has straightened this out for now --check HIV RNA, CD4 sending Biktarvy  to [facility] on [address] .  Asthma   continue Dulera  and prn albuterol  now send to [facility] in [place].      Gout continue allopurinol  and  colchicine    Hyperlipidemia: continue atorvastatin    HSV prevention: valtrex 

## 2023-09-05 ENCOUNTER — Other Ambulatory Visit: Payer: Self-pay

## 2023-09-05 ENCOUNTER — Other Ambulatory Visit (HOSPITAL_COMMUNITY)
Admission: RE | Admit: 2023-09-05 | Discharge: 2023-09-05 | Disposition: A | Discharge status: Home / Self Care | Source: Ambulatory Visit | Attending: Infectious Disease | Admitting: Infectious Disease

## 2023-09-05 ENCOUNTER — Ambulatory Visit (INDEPENDENT_AMBULATORY_CARE_PROVIDER_SITE_OTHER): Payer: Commercial Managed Care - HMO | Admitting: Infectious Disease

## 2023-09-05 ENCOUNTER — Other Ambulatory Visit (HOSPITAL_COMMUNITY): Payer: Self-pay

## 2023-09-05 ENCOUNTER — Telehealth: Payer: Self-pay

## 2023-09-05 VITALS — Wt 275.0 lb

## 2023-09-05 DIAGNOSIS — Z7185 Encounter for immunization safety counseling: Secondary | ICD-10-CM | POA: Diagnosis present

## 2023-09-05 DIAGNOSIS — B20 Human immunodeficiency virus [HIV] disease: Secondary | ICD-10-CM | POA: Insufficient documentation

## 2023-09-05 DIAGNOSIS — M109 Gout, unspecified: Secondary | ICD-10-CM

## 2023-09-05 DIAGNOSIS — J209 Acute bronchitis, unspecified: Secondary | ICD-10-CM

## 2023-09-05 DIAGNOSIS — J45909 Unspecified asthma, uncomplicated: Secondary | ICD-10-CM

## 2023-09-05 DIAGNOSIS — J4520 Mild intermittent asthma, uncomplicated: Secondary | ICD-10-CM

## 2023-09-05 MED ORDER — VALACYCLOVIR HCL 500 MG PO TABS
500.0000 mg | ORAL_TABLET | Freq: Two times a day (BID) | ORAL | 0 refills | Status: DC
  Filled 2023-09-05: qty 14, 7d supply, fill #0

## 2023-09-05 MED ORDER — DULERA 100-5 MCG/ACT IN AERO
2.0000 | INHALATION_SPRAY | Freq: Two times a day (BID) | RESPIRATORY_TRACT | 11 refills | Status: AC
  Filled 2023-09-30: qty 13, 30d supply, fill #0
  Filled 2023-11-10 – 2024-03-09 (×2): qty 13, 30d supply, fill #1

## 2023-09-05 MED ORDER — ATORVASTATIN CALCIUM 20 MG PO TABS
20.0000 mg | ORAL_TABLET | Freq: Every day | ORAL | 11 refills | Status: DC
  Filled 2023-09-05: qty 30, 30d supply, fill #0
  Filled 2023-09-30: qty 30, 30d supply, fill #1

## 2023-09-05 MED ORDER — ALLOPURINOL 100 MG PO TABS
100.0000 mg | ORAL_TABLET | Freq: Every day | ORAL | 2 refills | Status: AC
  Filled 2023-09-05: qty 30, 30d supply, fill #0
  Filled 2023-09-30: qty 30, 30d supply, fill #1

## 2023-09-05 MED ORDER — BIKTARVY 50-200-25 MG PO TABS: 1.0000 | ORAL_TABLET | Freq: Every day | ORAL | 11 refills | Status: DC

## 2023-09-05 MED ORDER — ALBUTEROL SULFATE HFA 108 (90 BASE) MCG/ACT IN AERS
2.0000 | INHALATION_SPRAY | Freq: Four times a day (QID) | RESPIRATORY_TRACT | 6 refills | Status: AC | PRN
  Filled 2023-09-05: qty 18, 25d supply, fill #0

## 2023-09-05 NOTE — Telephone Encounter (Signed)
 Pharmacy Patient Advocate Encounter  Insurance verification completed.   The patient is insured through [employer]  Ran test claim for Biktarvy . Currently a quantity of 30 is a 30 day supply and it was last filled on 09/03/23 .   This test claim was processed through [facility]- copay amounts may vary at other pharmacies due to pharmacy/plan contracts, or as the patient moves through the different stages of their insurance plan.

## 2023-09-06 LAB — CYTOLOGY, (ORAL, ANAL, URETHRAL) ANCILLARY ONLY
Chlamydia: NEGATIVE
Chlamydia: NEGATIVE
Comment: NEGATIVE
Comment: NEGATIVE
Comment: NORMAL
Comment: NORMAL
Neisseria Gonorrhea: NEGATIVE
Neisseria Gonorrhea: NEGATIVE

## 2023-09-06 LAB — T-HELPER CELLS (CD4) COUNT (NOT AT ARMC)
CD4 % Helper T Cell: 39 % (ref 33–65)
CD4 T Cell Abs: 911 /uL (ref 400–1790)

## 2023-09-06 LAB — URINE CYTOLOGY ANCILLARY ONLY
Chlamydia: POSITIVE — AB
Comment: NEGATIVE
Comment: NORMAL
Neisseria Gonorrhea: NEGATIVE

## 2023-09-07 ENCOUNTER — Ambulatory Visit: Payer: Self-pay

## 2023-09-07 ENCOUNTER — Other Ambulatory Visit: Payer: Self-pay

## 2023-09-07 DIAGNOSIS — A749 Chlamydial infection, unspecified: Secondary | ICD-10-CM

## 2023-09-07 MED ORDER — DOXYCYCLINE HYCLATE 100 MG PO TABS
100.0000 mg | ORAL_TABLET | Freq: Two times a day (BID) | ORAL | 0 refills | Status: DC
  Filled 2023-09-07: qty 14, 7d supply, fill #0

## 2023-09-08 LAB — CBC WITH DIFFERENTIAL/PLATELET
Absolute Lymphocytes: 2490 {cells}/uL (ref 850–3900)
Absolute Monocytes: 425 {cells}/uL (ref 200–950)
Basophils Absolute: 59 {cells}/uL (ref 0–200)
Basophils Relative: 1 %
Eosinophils Absolute: 159 {cells}/uL (ref 15–500)
Eosinophils Relative: 2.7 %
HCT: 43 % (ref 38.5–50.0)
Hemoglobin: 14.2 g/dL (ref 13.2–17.1)
MCH: 31.1 pg (ref 27.0–33.0)
MCHC: 33 g/dL (ref 32.0–36.0)
MCV: 94.1 fL (ref 80.0–100.0)
MPV: 9.7 fL (ref 7.5–12.5)
Monocytes Relative: 7.2 %
Neutro Abs: 2767 {cells}/uL (ref 1500–7800)
Neutrophils Relative %: 46.9 %
Platelets: 288 Thousand/uL (ref 140–400)
RBC: 4.57 Million/uL (ref 4.20–5.80)
RDW: 13.1 % (ref 11.0–15.0)
Total Lymphocyte: 42.2 %
WBC: 5.9 Thousand/uL (ref 3.8–10.8)

## 2023-09-08 LAB — COMPLETE METABOLIC PANEL WITHOUT GFR
AG Ratio: 1.4 (calc) (ref 1.0–2.5)
ALT: 16 U/L (ref 9–46)
AST: 20 U/L (ref 10–35)
Albumin: 4.1 g/dL (ref 3.6–5.1)
Alkaline phosphatase (APISO): 61 U/L (ref 35–144)
BUN/Creatinine Ratio: 8 (calc) (ref 6–22)
BUN: 11 mg/dL (ref 7–25)
CO2: 25 mmol/L (ref 20–32)
Calcium: 9.1 mg/dL (ref 8.6–10.3)
Chloride: 104 mmol/L (ref 98–110)
Creat: 1.33 mg/dL — ABNORMAL HIGH (ref 0.70–1.30)
Globulin: 2.9 g/dL (ref 1.9–3.7)
Glucose, Bld: 170 mg/dL — ABNORMAL HIGH (ref 65–99)
Potassium: 3.7 mmol/L (ref 3.5–5.3)
Sodium: 136 mmol/L (ref 135–146)
Total Bilirubin: 0.5 mg/dL (ref 0.2–1.2)
Total Protein: 7 g/dL (ref 6.1–8.1)

## 2023-09-08 LAB — LIPID PANEL
Cholesterol: 181 mg/dL (ref <200)
HDL: 34 mg/dL — ABNORMAL LOW (ref >=40)
LDL Cholesterol (Calc): 119 mg/dL — ABNORMAL HIGH
Non-HDL Cholesterol (Calc): 147 mg/dL — ABNORMAL HIGH (ref <130)
Total CHOL/HDL Ratio: 5.3 (calc) — ABNORMAL HIGH (ref <5.0)
Triglycerides: 167 mg/dL — ABNORMAL HIGH (ref <150)

## 2023-09-08 LAB — RPR: RPR Ser Ql: REACTIVE — AB

## 2023-09-08 LAB — HIV-1 RNA QUANT-NO REFLEX-BLD
HIV 1 RNA Quant: NOT DETECTED {copies}/mL
HIV-1 RNA Quant, Log: NOT DETECTED {Log_copies}/mL

## 2023-09-08 LAB — RPR TITER: RPR Titer: 1:8 {titer} — ABNORMAL HIGH

## 2023-09-08 LAB — T PALLIDUM AB: T Pallidum Abs: POSITIVE — AB

## 2023-09-09 ENCOUNTER — Other Ambulatory Visit: Payer: Self-pay

## 2023-09-09 ENCOUNTER — Other Ambulatory Visit: Payer: Self-pay | Admitting: Pharmacist

## 2023-09-09 DIAGNOSIS — B20 Human immunodeficiency virus [HIV] disease: Secondary | ICD-10-CM

## 2023-09-09 MED ORDER — BICTEGRAVIR-EMTRICITAB-TENOFOV 50-200-25 MG PO TABS: 1.0000 | ORAL_TABLET | Freq: Every day | ORAL | Status: AC

## 2023-09-09 MED ORDER — DOXYCYCLINE HYCLATE 100 MG PO TABS
ORAL_TABLET | ORAL | 0 refills | Status: DC
  Filled 2023-09-09: qty 60, fill #0
  Filled 2023-09-22: qty 60, 30d supply, fill #0

## 2023-09-09 NOTE — Progress Notes (Signed)
 Medication Samples have been provided to the patient.  Drug name: Biktarvy        Strength: 50/200/25 mg       Qty: 28 tablets (4 bottles) LOT: CTGMDA   Exp.Date: 7/27  Samples requested by Dr. Ernie Heal.  Dosing instructions: Take one tablet by mouth once daily  The patient has been instructed regarding the correct time, dose, and frequency of taking this medication, including desired effects and most common side effects.   Nicklas Barns, PharmD, CPP, BCIDP, AAHIVP Clinical Pharmacist Practitioner Infectious Diseases Clinical Pharmacist [facility]

## 2023-09-22 ENCOUNTER — Other Ambulatory Visit: Payer: Self-pay

## 2023-09-30 ENCOUNTER — Other Ambulatory Visit: Payer: Self-pay | Admitting: Infectious Disease

## 2023-09-30 ENCOUNTER — Other Ambulatory Visit: Payer: Self-pay

## 2023-10-03 ENCOUNTER — Other Ambulatory Visit: Payer: Self-pay | Admitting: Infectious Disease

## 2023-10-03 ENCOUNTER — Other Ambulatory Visit: Payer: Self-pay

## 2023-10-03 DIAGNOSIS — B20 Human immunodeficiency virus [HIV] disease: Secondary | ICD-10-CM

## 2023-10-03 MED ORDER — VALACYCLOVIR HCL 500 MG PO TABS
500.0000 mg | ORAL_TABLET | Freq: Two times a day (BID) | ORAL | 0 refills | Status: DC
  Filled 2023-10-03 – 2023-11-10 (×2): qty 14, 7d supply, fill #0

## 2023-10-03 NOTE — Telephone Encounter (Signed)
 60 tablets were sent 7/18

## 2023-10-03 NOTE — Telephone Encounter (Signed)
 Can you verify dosing for Valtrex  if he's taking is PRN?

## 2023-10-07 ENCOUNTER — Other Ambulatory Visit: Payer: Self-pay

## 2023-10-11 ENCOUNTER — Other Ambulatory Visit: Payer: Self-pay

## 2023-10-13 ENCOUNTER — Other Ambulatory Visit: Payer: Self-pay

## 2023-11-10 ENCOUNTER — Other Ambulatory Visit: Payer: Self-pay

## 2023-11-11 ENCOUNTER — Other Ambulatory Visit: Payer: Self-pay

## 2023-11-15 ENCOUNTER — Other Ambulatory Visit: Payer: Self-pay

## 2023-11-21 ENCOUNTER — Other Ambulatory Visit: Payer: Self-pay

## 2023-11-22 ENCOUNTER — Other Ambulatory Visit: Payer: Self-pay

## 2023-11-24 ENCOUNTER — Ambulatory Visit (INDEPENDENT_AMBULATORY_CARE_PROVIDER_SITE_OTHER): Admitting: Podiatry

## 2023-11-24 ENCOUNTER — Encounter: Payer: Self-pay | Admitting: Podiatry

## 2023-11-24 VITALS — Ht 70.0 in | Wt 275.0 lb

## 2023-11-24 DIAGNOSIS — B351 Tinea unguium: Secondary | ICD-10-CM

## 2023-11-24 DIAGNOSIS — Z79899 Other long term (current) drug therapy: Secondary | ICD-10-CM

## 2023-11-24 NOTE — Patient Instructions (Signed)
 For inserts I would get POWERSTEPS or SUPERFEET --  Plantar Fasciitis (Heel Spur Syndrome) with Rehab The plantar fascia is a fibrous, ligament-like, soft-tissue structure that spans the bottom of the foot. Plantar fasciitis is a condition that causes pain in the foot due to inflammation of the tissue. SYMPTOMS  Pain and tenderness on the underneath side of the foot. Pain that worsens with standing or walking. CAUSES  Plantar fasciitis is caused by irritation and injury to the plantar fascia on the underneath side of the foot. Common mechanisms of injury include: Direct trauma to bottom of the foot. Damage to a small nerve that runs under the foot where the main fascia attaches to the heel bone. Stress placed on the plantar fascia due to bone spurs. RISK INCREASES WITH:  Activities that place stress on the plantar fascia (running, jumping, pivoting, or cutting). Poor strength and flexibility. Improperly fitted shoes. Tight calf muscles. Flat feet. Failure to warm-up properly before activity. Obesity. PREVENTION Warm up and stretch properly before activity. Allow for adequate recovery between workouts. Maintain physical fitness: Strength, flexibility, and endurance. Cardiovascular fitness. Maintain a health body weight. Avoid stress on the plantar fascia. Wear properly fitted shoes, including arch supports for individuals who have flat feet.  PROGNOSIS  If treated properly, then the symptoms of plantar fasciitis usually resolve without surgery. However, occasionally surgery is necessary.  RELATED COMPLICATIONS  Recurrent symptoms that may result in a chronic condition. Problems of the lower back that are caused by compensating for the injury, such as limping. Pain or weakness of the foot during push-off following surgery. Chronic inflammation, scarring, and partial or complete fascia tear, occurring more often from repeated injections.  TREATMENT  Treatment initially involves  the use of ice and medication to help reduce pain and inflammation. The use of strengthening and stretching exercises may help reduce pain with activity, especially stretches of the Achilles tendon. These exercises may be performed at home or with a therapist. Your caregiver may recommend that you use heel cups of arch supports to help reduce stress on the plantar fascia. Occasionally, corticosteroid injections are given to reduce inflammation. If symptoms persist for greater than 6 months despite non-surgical (conservative), then surgery may be recommended.   MEDICATION  If pain medication is necessary, then nonsteroidal anti-inflammatory medications, such as aspirin and ibuprofen , or other minor pain relievers, such as acetaminophen , are often recommended. Do not take pain medication within 7 days before surgery. Prescription pain relievers may be given if deemed necessary by your caregiver. Use only as directed and only as much as you need. Corticosteroid injections may be given by your caregiver. These injections should be reserved for the most serious cases, because they may only be given a certain number of times.  HEAT AND COLD Cold treatment (icing) relieves pain and reduces inflammation. Cold treatment should be applied for 10 to 15 minutes every 2 to 3 hours for inflammation and pain and immediately after any activity that aggravates your symptoms. Use ice packs or massage the area with a piece of ice (ice massage). Heat treatment may be used prior to performing the stretching and strengthening activities prescribed by your caregiver, physical therapist, or athletic trainer. Use a heat pack or soak the injury in warm water.  SEEK IMMEDIATE MEDICAL CARE IF: Treatment seems to offer no benefit, or the condition worsens. Any medications produce adverse side effects.  EXERCISES- RANGE OF MOTION (ROM) AND STRETCHING EXERCISES - Plantar Fasciitis (Heel Spur Syndrome) These exercises may  help  you when beginning to rehabilitate your injury. Your symptoms may resolve with or without further involvement from your physician, physical therapist or athletic trainer. While completing these exercises, remember:  Restoring tissue flexibility helps normal motion to return to the joints. This allows healthier, less painful movement and activity. An effective stretch should be held for at least 30 seconds. A stretch should never be painful. You should only feel a gentle lengthening or release in the stretched tissue.  RANGE OF MOTION - Toe Extension, Flexion Sit with your right / left leg crossed over your opposite knee. Grasp your toes and gently pull them back toward the top of your foot. You should feel a stretch on the bottom of your toes and/or foot. Hold this stretch for 10 seconds. Now, gently pull your toes toward the bottom of your foot. You should feel a stretch on the top of your toes and or foot. Hold this stretch for 10 seconds. Repeat  times. Complete this stretch 3 times per day.   RANGE OF MOTION - Ankle Dorsiflexion, Active Assisted Remove shoes and sit on a chair that is preferably not on a carpeted surface. Place right / left foot under knee. Extend your opposite leg for support. Keeping your heel down, slide your right / left foot back toward the chair until you feel a stretch at your ankle or calf. If you do not feel a stretch, slide your bottom forward to the edge of the chair, while still keeping your heel down. Hold this stretch for 10 seconds. Repeat 3 times. Complete this stretch 2 times per day.   STRETCH  Gastroc, Standing Place hands on wall. Extend right / left leg, keeping the front knee somewhat bent. Slightly point your toes inward on your back foot. Keeping your right / left heel on the floor and your knee straight, shift your weight toward the wall, not allowing your back to arch. You should feel a gentle stretch in the right / left calf. Hold this position  for 10 seconds. Repeat 3 times. Complete this stretch 2 times per day.  STRETCH  Soleus, Standing Place hands on wall. Extend right / left leg, keeping the other knee somewhat bent. Slightly point your toes inward on your back foot. Keep your right / left heel on the floor, bend your back knee, and slightly shift your weight over the back leg so that you feel a gentle stretch deep in your back calf. Hold this position for 10 seconds. Repeat 3 times. Complete this stretch 2 times per day.  STRETCH  Gastrocsoleus, Standing  Note: This exercise can place a lot of stress on your foot and ankle. Please complete this exercise only if specifically instructed by your caregiver.  Place the ball of your right / left foot on a step, keeping your other foot firmly on the same step. Hold on to the wall or a rail for balance. Slowly lift your other foot, allowing your body weight to press your heel down over the edge of the step. You should feel a stretch in your right / left calf. Hold this position for 10 seconds. Repeat this exercise with a slight bend in your right / left knee. Repeat 3 times. Complete this stretch 2 times per day.   STRENGTHENING EXERCISES - Plantar Fasciitis (Heel Spur Syndrome)  These exercises may help you when beginning to rehabilitate your injury. They may resolve your symptoms with or without further involvement from your physician, physical therapist or  Event organiser. While completing these exercises, remember:  Muscles can gain both the endurance and the strength needed for everyday activities through controlled exercises. Complete these exercises as instructed by your physician, physical therapist or athletic trainer. Progress the resistance and repetitions only as guided.  STRENGTH - Towel Curls Sit in a chair positioned on a non-carpeted surface. Place your foot on a towel, keeping your heel on the floor. Pull the towel toward your heel by only curling your toes.  Keep your heel on the floor. Repeat 3 times. Complete this exercise 2 times per day.  STRENGTH - Ankle Inversion Secure one end of a rubber exercise band/tubing to a fixed object (table, pole). Loop the other end around your foot just before your toes. Place your fists between your knees. This will focus your strengthening at your ankle. Slowly, pull your big toe up and in, making sure the band/tubing is positioned to resist the entire motion. Hold this position for 10 seconds. Have your muscles resist the band/tubing as it slowly pulls your foot back to the starting position. Repeat 3 times. Complete this exercises 2 times per day.  Document Released: 02/08/2005 Document Revised: 05/03/2011 Document Reviewed: 05/23/2008 [facility] Patient Information 2014 [employer], [place].  --  Terbinafine  Tablets What is this medication? TERBINAFINE  (TER bin a feen) treats fungal infections of the nails. It belongs to a group of medications called antifungals. It will not treat infections caused by bacteria or viruses. This medicine may be used for other purposes; ask your health care provider or pharmacist if you have questions. COMMON BRAND NAME(S): Lamisil , Terbinex What should I tell my care team before I take this medication? They need to know if you have any of these conditions: Liver disease An unusual or allergic reaction to terbinafine , other medications, foods, dyes, or preservatives Pregnant or trying to get pregnant Breast-feeding How should I use this medication? Take this medication by mouth with water. Take it as directed on the prescription label at the same time every day. You can take it with or without food. If it upsets your stomach, take it with food. Keep taking it unless your care team tells you to stop. A special MedGuide will be given to you by the pharmacist with each prescription and refill. Be sure to read this information carefully each time. Talk to your care team about the  use of this medication in children. Special care may be needed. Overdosage: If you think you have taken too much of this medicine contact a poison control center or emergency room at once. NOTE: This medicine is only for you. Do not share this medicine with others. What if I miss a dose? If you miss a dose, take it as soon as you can unless it is more than 4 hours late. If it is more than 4 hours late, skip the missed dose. Take the next dose at the normal time. What may interact with this medication? Do not take this medication with any of the following: Pimozide Thioridazine This medication may also interact with the following: Beta blockers Caffeine Certain medications for mental health conditions Cimetidine Cyclosporine Medications for fungal infections, such as fluconazole or ketoconazole Medications for irregular heartbeat, such as amiodarone, flecainide, propafenone Rifampin Warfarin This list may not describe all possible interactions. Give your health care provider a list of all the medicines, herbs, non-prescription drugs, or dietary supplements you use. Also tell them if you smoke, drink alcohol, or use illegal drugs. Some items may interact  with your medicine. What should I watch for while using this medication? Visit your care team for regular checks on your progress. Tell your care team if your symptoms do not start to get better or if they get worse. This medication may cause serious skin reactions. They can happen weeks to months after starting the medication. Contact your care team right away if you notice fevers or flu-like symptoms with a rash. The rash may be red or purple and then turn into blisters or peeling of the skin. You may also notice a red rash with swelling of the face, lips, or lymph nodes in your neck or under your arms. This medication can make you more sensitive to the sun. Keep out of the sun. If you cannot avoid being in the sun, wear protective clothing  and sunscreen. Do not use sun lamps, tanning beds, or tanning booths. What side effects may I notice from receiving this medication? Side effects that you should report to your care team as soon as possible: Allergic reactions--skin rash, itching, hives, swelling of the face, lips, tongue, or throat Change in sense of smell Change in taste Infection--fever, chills, cough, or sore throat Liver injury--right upper belly pain, loss of appetite, nausea, light-colored stool, dark yellow or brown urine, yellowing skin or eyes, unusual weakness or fatigue Low red blood cell level--unusual weakness or fatigue, dizziness, headache, trouble breathing Lupus-like syndrome--joint pain, swelling, or stiffness, butterfly-shaped rash on the face, rashes that get worse in the sun, fever, unusual weakness or fatigue Rash, fever, and swollen lymph nodes Redness, blistering, peeling, or loosening of the skin, including inside the mouth Unusual bruising or bleeding Worsening mood, feelings of depression Side effects that usually do not require medical attention (report to your care team if they continue or are bothersome): Diarrhea Gas Headache Nausea Stomach pain Upset stomach This list may not describe all possible side effects. Call your doctor for medical advice about side effects. You may report side effects to FDA at 1-800-FDA-1088. Where should I keep my medication? Keep out of the reach of children and pets. Store between 20 and 25 degrees C (68 and 77 degrees F). Protect from light. Get rid of any unused medication after the expiration date. To get rid of medications that are no longer needed or have expired: Take the medication to a medication take-back program. Check with your pharmacy or law enforcement to find a location. If you cannot return the medication, check the label or package insert to see if the medication should be thrown out in the garbage or flushed down the toilet. If you are not  sure, ask your care team. If it is safe to put it in the trash, take the medication out of the container. Mix the medication with cat litter, dirt, coffee grounds, or other unwanted substance. Seal the mixture in a bag or container. Put it in the trash. NOTE: This sheet is a summary. It may not cover all possible information. If you have questions about this medicine, talk to your doctor, pharmacist, or health care provider.  2024 Elsevier/Gold Standard (2022-08-27 00:00:00)

## 2023-11-24 NOTE — Progress Notes (Signed)
 Subjective: Chief Complaint  Patient presents with   Nail Problem    Pt is here for RFC.    48 year old male presents the office today for concerns of thick, elongated nails that he is not able to trim himself.  Still has some thickening discoloration to the nails.  Previously was on Lamisil .  No open lesions.  Objective: AAO x3, NAD DP/PT pulses palpable bilaterally, CRT less than 3 seconds Nails are hypertrophic, dystrophic, brittle, discolored, elongated 10. No surrounding redness or drainage. Tenderness nails 1-5 bilaterally. No open lesions or pre-ulcerative lesions are identified today. No pain with calf compression, swelling, warmth, erythema  Assessment: Symptomatic onychomycosis  Plan:  Symptomatic onychomycosis - Sharply debrided nails x 10 without any complications or bleeding.   - We discussed getting back on Lamisil .  Will recheck CBC, LFT prior to starting the medication.  Also discussed topical medication through [facility] for compound which was faxed today.  Return in about 3 months (around 02/24/2024).  Donnice JONELLE Fees DPM

## 2023-11-30 ENCOUNTER — Other Ambulatory Visit: Payer: Self-pay

## 2023-12-01 ENCOUNTER — Other Ambulatory Visit: Payer: Self-pay

## 2024-01-04 ENCOUNTER — Ambulatory Visit: Admitting: Infectious Disease

## 2024-01-16 ENCOUNTER — Encounter: Payer: Self-pay | Admitting: Infectious Disease

## 2024-01-16 ENCOUNTER — Ambulatory Visit (INDEPENDENT_AMBULATORY_CARE_PROVIDER_SITE_OTHER): Admitting: Infectious Disease

## 2024-01-16 ENCOUNTER — Other Ambulatory Visit: Payer: Self-pay

## 2024-01-16 VITALS — BP 119/81 | HR 72 | Temp 97.5°F | Ht 70.0 in | Wt 272.0 lb

## 2024-01-16 DIAGNOSIS — B20 Human immunodeficiency virus [HIV] disease: Secondary | ICD-10-CM | POA: Diagnosis present

## 2024-01-16 DIAGNOSIS — Z1212 Encounter for screening for malignant neoplasm of rectum: Secondary | ICD-10-CM | POA: Diagnosis not present

## 2024-01-16 DIAGNOSIS — Z79899 Other long term (current) drug therapy: Secondary | ICD-10-CM | POA: Diagnosis not present

## 2024-01-16 DIAGNOSIS — Z23 Encounter for immunization: Secondary | ICD-10-CM

## 2024-01-16 DIAGNOSIS — E785 Hyperlipidemia, unspecified: Secondary | ICD-10-CM

## 2024-01-16 DIAGNOSIS — J45909 Unspecified asthma, uncomplicated: Secondary | ICD-10-CM | POA: Diagnosis not present

## 2024-01-16 DIAGNOSIS — Z7185 Encounter for immunization safety counseling: Secondary | ICD-10-CM | POA: Diagnosis not present

## 2024-01-16 DIAGNOSIS — Z6837 Body mass index (BMI) 37.0-37.9, adult: Secondary | ICD-10-CM

## 2024-01-16 MED ORDER — VALACYCLOVIR HCL 500 MG PO TABS
500.0000 mg | ORAL_TABLET | Freq: Two times a day (BID) | ORAL | 0 refills | Status: DC
  Filled 2024-01-16: qty 14, 7d supply, fill #0

## 2024-01-16 MED ORDER — DOXYCYCLINE HYCLATE 100 MG PO TABS
ORAL_TABLET | ORAL | 6 refills | Status: AC
  Filled 2024-01-16: qty 60, 30d supply, fill #0
  Filled 2024-03-09: qty 60, 30d supply, fill #1

## 2024-01-16 MED ORDER — ATORVASTATIN CALCIUM 20 MG PO TABS
20.0000 mg | ORAL_TABLET | Freq: Every day | ORAL | 11 refills | Status: AC
  Filled 2024-01-16: qty 30, 30d supply, fill #0

## 2024-01-16 MED ORDER — BIKTARVY 50-200-25 MG PO TABS
1.0000 | ORAL_TABLET | Freq: Every day | ORAL | 11 refills | Status: AC
Start: 2024-01-16 — End: ?

## 2024-01-16 NOTE — Progress Notes (Signed)
 Subjective:  Chief complaint: follow-up for HIV disease on medications   Patient ID: Robert Stout, male    DOB: 04/20/1975, 48 y.o.   MRN: [id number]  HPI  Discussed the use of AI scribe software for clinical note transcription with the patient, who gave verbal consent to proceed.  History of Present Illness   He is a 48 year old male with HIV who presents for a follow-up visit and medication management.  He is seeking a refill of doxycycline , which he uses for STI prophylaxis, specifically to prevent chlamydia and syphilis. He previously received a 60-count prescription, which lasted approximately two months.   He is due for routine blood work and vaccinations, specifically the flu and COVID vaccines.   He is currently taking Biktarvy , which he obtains from [facility] without any issues. He has Medicaid coverage for his medications.  He has not had an anal Pap smear in several years, which is recommended for individuals with HIV who have had receptive anal intercourse, to screen for rectal cancer.  His asthma is currently stable, but he does not have a primary care or pulmonary doctor to manage this condition.      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   HIV infection (HCC) Dx 2011   Hyperglycemia 09/01/2020   Hyperlipidemia 03/07/2022   Rectal bleeding 10/09/2018   Vaccine counseling 04/05/2021   Weight gain 10/09/2018    Past Surgical History:  Procedure Laterality Date   TENDON REPAIR      Family History  Problem Relation Age of Onset   Diabetes Mother    Hypertension Mother       Social History   Socioeconomic History   Marital status: Single    Spouse name: Not on file   Number of children: Not on file   Years of education: Not on file   Highest education level: Not on  file  Occupational History   Not on file  Tobacco Use   Smoking status: Never    Passive exposure: Never   Smokeless tobacco: Never  Vaping Use   Vaping status: Never Used  Substance and Sexual Activity   Alcohol use: No    Alcohol/week: 0.0 standard drinks of alcohol   Drug use: No   Sexual activity: Not Currently    Comment: declined condoms  Other Topics Concern   Not on file  Social History Narrative   Not on file   Social Drivers of Health   Financial Resource Strain: Not on file  Food Insecurity: Not on file  Transportation Needs: Not on file  Physical Activity: Not on file  Stress: Not on file  Social Connections: Not on file    No Known Allergies   Current Outpatient Medications:    albuterol  (VENTOLIN  HFA) 108 (90 Base) MCG/ACT inhaler, Inhale 2 puffs into the lungs every 6 (six) hours as needed for wheezing or shortness of breath., Disp: 18 g, Rfl: 6   allopurinol  (ZYLOPRIM ) 100 MG tablet, Take 1 tablet (100 mg total) by mouth daily., Disp: 90 tablet, Rfl: 2   atorvastatin  (LIPITOR) 20 MG tablet, Take 1 tablet (20 mg total) by mouth daily., Disp: 30 tablet, Rfl: 11   bictegravir-emtricitabine -tenofovir  AF (BIKTARVY ) 50-200-25 MG TABS tablet, Take 1 tablet by mouth daily., Disp: 30 tablet, Rfl: 11  bictegravir-emtricitabine -tenofovir  AF (BIKTARVY ) 50-200-25 MG TABS tablet, Take 1 tablet by mouth daily for 28 days., Disp: , Rfl:    colchicine  0.6 MG tablet, Take 1 tablet (0.6 mg total) by mouth daily., Disp: 30 tablet, Rfl: 11   imiquimod  (ALDARA ) 5 % cream, APPLY CONTENTS OF 1 PACKET TOPICALLY TO THE AFFECTED AREA THREE TIMES PER WEEK, Disp: 12 each, Rfl: 3   loratadine  (CLARITIN ) 10 MG tablet, Take 1 tablet (10 mg total) by mouth daily., Disp: 30 tablet, Rfl: 11   methylPREDNISolone  (MEDROL  DOSEPAK) 4 MG TBPK tablet, Take as directed on dose pack, Disp: 21 tablet, Rfl: 0   mometasone -formoterol  (DULERA ) 100-5 MCG/ACT AERO, Inhale 2 puffs into the lungs 2 (two)  times daily., Disp: 13 g, Rfl: 11   Sodium Fluoride  (PREVIDENT  5000 BOOSTER PLUS) 1.1 % PSTE, BRUSH ON TEETH IN THE EVENING FOR MOUTH CARE. SPIT OUT EXCESS. DO NOT RINSE., Disp: 100 mL, Rfl: 10   terbinafine  (LAMISIL ) 250 MG tablet, Take 1 tablet (250 mg total) by mouth daily., Disp: 90 tablet, Rfl: 0   valACYclovir  (VALTREX ) 500 MG tablet, Take 1 tablet (500 mg total) by mouth 2 (two) times daily. As needed for outbreak, Disp: 14 tablet, Rfl: 0   Review of Systems  Constitutional:  Negative for activity change, appetite change, chills, diaphoresis, fatigue, fever and unexpected weight change.  HENT:  Negative for congestion, rhinorrhea, sinus pressure, sneezing, sore throat and trouble swallowing.   Eyes:  Negative for photophobia and visual disturbance.  Respiratory:  Negative for cough, chest tightness, shortness of breath, wheezing and stridor.   Cardiovascular:  Negative for chest pain, palpitations and leg swelling.  Gastrointestinal:  Negative for abdominal distention, abdominal pain, anal bleeding, blood in stool, constipation, diarrhea, nausea and vomiting.  Genitourinary:  Negative for difficulty urinating, dysuria, flank pain and hematuria.  Musculoskeletal:  Negative for arthralgias, back pain, gait problem, joint swelling and myalgias.  Skin:  Negative for color change, pallor, rash and wound.  Neurological:  Negative for dizziness, tremors, weakness and light-headedness.  Hematological:  Negative for adenopathy. Does not bruise/bleed easily.  Psychiatric/Behavioral:  Negative for agitation, behavioral problems, confusion, decreased concentration, dysphoric mood and sleep disturbance.        Objective:   Physical Exam Constitutional:      Appearance: He is well-developed.  HENT:     Head: Normocephalic and atraumatic.  Eyes:     Conjunctiva/sclera: Conjunctivae normal.  Cardiovascular:     Rate and Rhythm: Normal rate and regular rhythm.  Pulmonary:     Effort: Pulmonary  effort is normal. No respiratory distress.     Breath sounds: No wheezing.  Abdominal:     General: There is no distension.     Palpations: Abdomen is soft.  Musculoskeletal:        General: No tenderness. Normal range of motion.     Cervical back: Normal range of motion and neck supple.  Skin:    General: Skin is warm and dry.     Coloration: Skin is not pale.     Findings: No erythema or rash.  Neurological:     General: No focal deficit present.     Mental Status: He is alert and oriented to person, place, and time.  Psychiatric:        Mood and Affect: Mood normal.        Behavior: Behavior normal.        Thought Content: Thought content normal.  Judgment: Judgment normal.           Assessment & Plan:   Assessment and Plan    Immunization safety counseling and administration of influenza and COVID-19 vaccines Discussed the importance of annual influenza and COVID-19 vaccinations due to virus persistence and mutation. Emphasized staying up to date with vaccinations. - Administered influenza vaccine. - Administered COVID-19 vaccine.  Human immunodeficiency virus (HIV) disease, stable on antiretroviral therapy HIV well-controlled with undetectable viral load and CD4 count of 911. Emphasized continued Biktarvy  therapy and access to medication. - Continue Biktarvy . - Prescribed doxycycline  with six refills for post-exposure prophylaxis. - Ordered blood work for routine monitoring HIV RNA, CD4 etc - Scheduled follow-up appointment in January.   Rectal cancer screening:  - Discussed anal Pap smear for rectal cancer screening to be done at next visit  Unspecified asthma, referral to pulmonology for optimization Asthma management requires optimization. Referral to pulmonologist needed for further evaluation and management. - Referred to pulmonology for asthma management.  STI screening: will check RPR today, screen for GC and CHL next visit, per patient request

## 2024-01-17 LAB — T-HELPER CELLS (CD4) COUNT (NOT AT ARMC)
CD4 % Helper T Cell: 41 % (ref 33–65)
CD4 T Cell Abs: 1135 /uL (ref 400–1790)

## 2024-01-19 LAB — CBC WITH DIFFERENTIAL/PLATELET
Absolute Lymphocytes: 2761 {cells}/uL (ref 850–3900)
Absolute Monocytes: 387 {cells}/uL (ref 200–950)
Basophils Absolute: 42 {cells}/uL (ref 0–200)
Basophils Relative: 0.8 %
Eosinophils Absolute: 201 {cells}/uL (ref 15–500)
Eosinophils Relative: 3.8 %
HCT: 44 % (ref 39.4–51.1)
Hemoglobin: 14.5 g/dL (ref 13.2–17.1)
MCH: 30.1 pg (ref 27.0–33.0)
MCHC: 33 g/dL (ref 31.6–35.4)
MCV: 91.3 fL (ref 81.4–101.7)
MPV: 9.8 fL (ref 7.5–12.5)
Monocytes Relative: 7.3 %
Neutro Abs: 1908 {cells}/uL (ref 1500–7800)
Neutrophils Relative %: 36 %
Platelets: 323 Thousand/uL (ref 140–400)
RBC: 4.82 Million/uL (ref 4.20–5.80)
RDW: 12.7 % (ref 11.0–15.0)
Total Lymphocyte: 52.1 %
WBC: 5.3 Thousand/uL (ref 3.8–10.8)

## 2024-01-19 LAB — SYPHILIS: RPR W/REFLEX TO RPR TITER AND TREPONEMAL ANTIBODIES, TRADITIONAL SCREENING AND DIAGNOSIS ALGORITHM: RPR Ser Ql: REACTIVE — AB

## 2024-01-19 LAB — COMPLETE METABOLIC PANEL WITHOUT GFR
AG Ratio: 1.4 (calc) (ref 1.0–2.5)
ALT: 13 U/L (ref 9–46)
AST: 20 U/L (ref 10–35)
Albumin: 4.1 g/dL (ref 3.6–5.1)
Alkaline phosphatase (APISO): 59 U/L (ref 35–144)
BUN: 10 mg/dL (ref 7–25)
CO2: 21 mmol/L (ref 20–32)
Calcium: 9.5 mg/dL (ref 8.6–10.3)
Chloride: 104 mmol/L (ref 98–110)
Creat: 1.28 mg/dL (ref 0.70–1.30)
Globulin: 3 g/dL (ref 1.9–3.7)
Glucose, Bld: 109 mg/dL — ABNORMAL HIGH (ref 65–99)
Potassium: 4.2 mmol/L (ref 3.5–5.3)
Sodium: 139 mmol/L (ref 135–146)
Total Bilirubin: 0.3 mg/dL (ref 0.2–1.2)
Total Protein: 7.1 g/dL (ref 6.1–8.1)

## 2024-01-19 LAB — LIPID PANEL
Cholesterol: 160 mg/dL (ref <200)
HDL: 36 mg/dL — ABNORMAL LOW (ref >=40)
LDL Cholesterol (Calc): 99 mg/dL
Non-HDL Cholesterol (Calc): 124 mg/dL (ref <130)
Total CHOL/HDL Ratio: 4.4 (calc) (ref <5.0)
Triglycerides: 153 mg/dL — ABNORMAL HIGH (ref <150)

## 2024-01-19 LAB — T PALLIDUM AB: T Pallidum Abs: POSITIVE — AB

## 2024-01-19 LAB — HIV-1 RNA QUANT-NO REFLEX-BLD
HIV 1 RNA Quant: <20 {copies}/mL — AB
HIV-1 RNA Quant, Log: <1.3 {Log_copies}/mL — AB

## 2024-01-19 LAB — RPR TITER: RPR Titer: 1:16 {titer} — ABNORMAL HIGH

## 2024-01-23 ENCOUNTER — Other Ambulatory Visit: Payer: Self-pay

## 2024-01-24 ENCOUNTER — Other Ambulatory Visit: Payer: Self-pay

## 2024-01-24 ENCOUNTER — Ambulatory Visit

## 2024-01-25 ENCOUNTER — Other Ambulatory Visit: Payer: Self-pay

## 2024-02-27 ENCOUNTER — Ambulatory Visit: Admitting: Podiatry

## 2024-03-09 ENCOUNTER — Other Ambulatory Visit: Payer: Self-pay | Admitting: Infectious Disease

## 2024-03-09 ENCOUNTER — Other Ambulatory Visit: Payer: Self-pay

## 2024-03-09 DIAGNOSIS — B20 Human immunodeficiency virus [HIV] disease: Secondary | ICD-10-CM

## 2024-03-12 ENCOUNTER — Other Ambulatory Visit: Payer: Self-pay

## 2024-03-12 MED ORDER — VALACYCLOVIR HCL 500 MG PO TABS
500.0000 mg | ORAL_TABLET | Freq: Two times a day (BID) | ORAL | 0 refills | Status: AC
  Filled 2024-03-12: qty 14, 7d supply, fill #0

## 2024-03-13 ENCOUNTER — Other Ambulatory Visit: Payer: Self-pay

## 2024-05-16 ENCOUNTER — Ambulatory Visit: Admitting: Infectious Disease
